# Patient Record
Sex: Male | Born: 1975
Health system: Southern US, Community
[De-identification: ages and names within clinical notes are randomized; demographics above are authoritative.]

## PROBLEM LIST (undated history)

## (undated) DIAGNOSIS — N289 Disorder of kidney and ureter, unspecified: Secondary | ICD-10-CM

## (undated) DIAGNOSIS — G43909 Migraine, unspecified, not intractable, without status migrainosus: Secondary | ICD-10-CM

## (undated) DIAGNOSIS — Z0000 Encounter for general adult medical examination without abnormal findings: Secondary | ICD-10-CM

## (undated) DIAGNOSIS — I10 Essential (primary) hypertension: Secondary | ICD-10-CM

## (undated) DIAGNOSIS — Z87442 Personal history of urinary calculi: Secondary | ICD-10-CM

## (undated) DIAGNOSIS — F1011 Alcohol abuse, in remission: Secondary | ICD-10-CM

## (undated) HISTORY — DX: Alcohol abuse, in remission: F10.11

## (undated) HISTORY — DX: Personal history of urinary calculi: Z87.442

## (undated) HISTORY — DX: Encounter for general adult medical examination without abnormal findings: Z00.00

## (undated) HISTORY — DX: Essential (primary) hypertension: I10

---

## 2014-04-04 ENCOUNTER — Emergency Department (HOSPITAL_BASED_OUTPATIENT_CLINIC_OR_DEPARTMENT_OTHER)
Admission: EM | Admit: 2014-04-04 | Discharge: 2014-04-04 | Disposition: A | Discharge status: Home / Self Care | Payer: BC Managed Care – PPO | Attending: Emergency Medicine | Admitting: Emergency Medicine

## 2014-04-04 ENCOUNTER — Encounter (HOSPITAL_BASED_OUTPATIENT_CLINIC_OR_DEPARTMENT_OTHER): Payer: Self-pay | Admitting: Emergency Medicine

## 2014-04-04 DIAGNOSIS — I10 Essential (primary) hypertension: Secondary | ICD-10-CM | POA: Insufficient documentation

## 2014-04-04 DIAGNOSIS — Z8679 Personal history of other diseases of the circulatory system: Secondary | ICD-10-CM | POA: Insufficient documentation

## 2014-04-04 DIAGNOSIS — J329 Chronic sinusitis, unspecified: Secondary | ICD-10-CM | POA: Insufficient documentation

## 2014-04-04 HISTORY — DX: Migraine, unspecified, not intractable, without status migrainosus: G43.909

## 2014-04-04 HISTORY — DX: Essential (primary) hypertension: I10

## 2014-04-04 MED ORDER — AMLODIPINE BESYLATE 10 MG PO TABS: 10.0000 mg | ORAL_TABLET | Freq: Every day | ORAL | Status: DC

## 2014-04-04 MED ORDER — HYDROCHLOROTHIAZIDE 25 MG PO TABS
25.0000 mg | ORAL_TABLET | Freq: Every day | ORAL | Status: DC
Start: 2014-04-04 — End: 2015-04-23

## 2014-04-04 MED ORDER — IBUPROFEN 400 MG PO TABS
600.0000 mg | ORAL_TABLET | Freq: Once | ORAL | Status: AC
  Administered 2014-04-04: 600 mg via ORAL
  Filled 2014-04-04 (×2): qty 1

## 2014-04-04 MED ORDER — BENZONATATE 100 MG PO CAPS
100.0000 mg | ORAL_CAPSULE | Freq: Once | ORAL | Status: AC
  Administered 2014-04-04: 100 mg via ORAL
  Filled 2014-04-04: qty 1

## 2014-04-04 NOTE — ED Provider Notes (Signed)
CSN: [id number]     Arrival date & time 04/04/14  1536 History   First MD Initiated Contact with Patient 04/04/14 1606     Chief Complaint  Patient presents with  . Cough     (Consider location/radiation/quality/duration/timing/severity/associated sxs/prior Treatment) Patient is a 38 y.o. male presenting with URI.  URI Presenting symptoms: congestion, cough, facial pain and sore throat   Severity:  Moderate Onset quality:  Gradual Duration:  7 days Timing:  Constant Progression:  Worsening Chronicity:  New Relieved by:  Nothing Ineffective treatments:  OTC medications and decongestant Associated symptoms: headaches, myalgias and sinus pain   Risk factors: no sick contacts     Past Medical History  Diagnosis Date  . Migraine   . Hypertension    History reviewed. No pertinent past surgical history. History reviewed. No pertinent family history. History  Substance Use Topics  . Smoking status: Never Smoker   . Smokeless tobacco: Not on file  . Alcohol Use: No    Review of Systems  HENT: Positive for congestion and sore throat.   Respiratory: Positive for cough.   Musculoskeletal: Positive for myalgias.  Neurological: Positive for headaches.  All other systems reviewed and are negative.     Allergies  Review of patient's allergies indicates no known allergies.  Home Medications   Prior to Admission medications   Not on File   BP 181/111  Pulse 70  Temp(Src) 98.2 F (36.8 C) (Oral)  Resp 18  Ht 6\' 2"  (1.88 m)  Wt 240 lb (108.863 kg)  BMI 30.80 kg/m2  SpO2 100% Physical Exam  Nursing note and vitals reviewed. Constitutional: He is oriented to person, place, and time. He appears well-developed and well-nourished. No distress.  HENT:  Head: Normocephalic and atraumatic.  Nose: Right sinus exhibits maxillary sinus tenderness and frontal sinus tenderness. Left sinus exhibits maxillary sinus tenderness and frontal sinus tenderness.  Mouth/Throat: Mucous  membranes are normal. No trismus in the jaw. Posterior oropharyngeal erythema present. No oropharyngeal exudate, posterior oropharyngeal edema or tonsillar abscesses.  Eyes: Conjunctivae are normal. Pupils are equal, round, and reactive to light. No scleral icterus.  Neck: Neck supple.  Cardiovascular: Normal rate, regular rhythm, normal heart sounds and intact distal pulses.   No murmur heard. Pulmonary/Chest: Effort normal and breath sounds normal. No stridor. No respiratory distress. He has no wheezes. He has no rales.  Abdominal: Soft. He exhibits no distension. There is no tenderness.  Musculoskeletal: Normal range of motion. He exhibits no edema.  Neurological: He is alert and oriented to person, place, and time.  Skin: Skin is warm and dry. No rash noted.  Psychiatric: He has a normal mood and affect. His behavior is normal.    ED Course  Procedures (including critical care time) Labs Review Labs Reviewed - No data to display  Imaging Review No results found.   EKG Interpretation None      MDM   Final diagnoses:  Sinusitis    38 year old male presenting with about a week worth of URI symptoms. Today, he coughed up what he described as "pus" which concerned him enough to bring him to the emergency department.  Well-appearing, nontoxic, lungs clear to auscultation bilaterally. Has sinus tenderness to palpation. Recommended supportive treatment for his likely viral sinusitis, excluding decongestants given his history of hypertension.  He also requests refills of his antihypertensives. He has a physical in 2 weeks with his primary doctor.    Candyce ChurnJohn David Waco Foerster III, MD 04/04/14 [phone]

## 2014-04-04 NOTE — ED Notes (Signed)
Pt reports productive cough with white sputum x 1 week.  Denies fever.

## 2014-04-04 NOTE — Discharge Instructions (Signed)

## 2014-09-16 DIAGNOSIS — I10 Essential (primary) hypertension: Secondary | ICD-10-CM

## 2014-09-16 HISTORY — DX: Essential (primary) hypertension: I10

## 2015-04-22 ENCOUNTER — Telehealth: Payer: Self-pay | Admitting: *Deleted

## 2015-04-22 NOTE — Telephone Encounter (Signed)
Unable to reach patient at time of Pre-Visit Call.  Left message for patient to return call when available.    

## 2015-04-23 ENCOUNTER — Telehealth: Payer: Self-pay | Admitting: *Deleted

## 2015-04-23 ENCOUNTER — Encounter: Payer: Self-pay | Admitting: Family

## 2015-04-23 ENCOUNTER — Ambulatory Visit (INDEPENDENT_AMBULATORY_CARE_PROVIDER_SITE_OTHER): Payer: BLUE CROSS/BLUE SHIELD | Admitting: Family

## 2015-04-23 VITALS — BP 180/120 | HR 77 | Temp 98.2°F | Resp 16 | Ht 73.5 in | Wt 223.8 lb

## 2015-04-23 DIAGNOSIS — G43909 Migraine, unspecified, not intractable, without status migrainosus: Secondary | ICD-10-CM | POA: Insufficient documentation

## 2015-04-23 DIAGNOSIS — I10 Essential (primary) hypertension: Secondary | ICD-10-CM | POA: Diagnosis not present

## 2015-04-23 DIAGNOSIS — Z0000 Encounter for general adult medical examination without abnormal findings: Secondary | ICD-10-CM | POA: Diagnosis not present

## 2015-04-23 DIAGNOSIS — G43809 Other migraine, not intractable, without status migrainosus: Secondary | ICD-10-CM

## 2015-04-23 HISTORY — DX: Encounter for general adult medical examination without abnormal findings: Z00.00

## 2015-04-23 HISTORY — DX: Essential (primary) hypertension: I10

## 2015-04-23 LAB — URINALYSIS, ROUTINE W REFLEX MICROSCOPIC
Bilirubin Urine: NEGATIVE
Hgb urine dipstick: NEGATIVE
Ketones, ur: NEGATIVE
Leukocytes, UA: NEGATIVE
Nitrite: NEGATIVE
RBC / HPF: NONE SEEN (ref 0–?)
Specific Gravity, Urine: 1.02 (ref 1.000–1.030)
Total Protein, Urine: NEGATIVE
URINE GLUCOSE: NEGATIVE
UROBILINOGEN UA: 0.2 (ref 0.0–1.0)
pH: 6 (ref 5.0–8.0)

## 2015-04-23 MED ORDER — AMLODIPINE BESYLATE 5 MG PO TABS: 5.0000 mg | ORAL_TABLET | Freq: Every day | ORAL | Status: DC

## 2015-04-23 NOTE — Patient Instructions (Signed)
Please complete lab work prior to leaving.  Start norvasc 5mg  once daily.  Follow up in 2 weeks.  Welcome to [facility]!

## 2015-04-23 NOTE — Assessment & Plan Note (Signed)
Stable.  Monitor.  

## 2015-04-23 NOTE — Assessment & Plan Note (Signed)
Uncontrolled.  Add amlodipine.  Reinforced importance of low sodium diet.

## 2015-04-23 NOTE — Progress Notes (Signed)
Subjective:    Patient ID: Taylor Herrera, male    DOB: [birth year], 39 y.o.   MRN: [id number]  HPI  Taylor Herrera is a 39 yr old male who presents today to establish care.  His primary concern today is his uncontrolled hypertension. He was diagnosed with hypertension 1 year ago. He is maintained on diovan- HCT.  He has used norvasc in the past.  Denies SOB/CP/Swelling. Positive compliance with meds and low sodium diet.  BP Readings from Last 3 Encounters:  04/23/15 180/120  04/04/14 181/111   Migraines- last 1-2 years ago.    Preventative- exercises 4-5 times a week. Diet- healthy Immunizations-Tetanus 6/15 Dental: up to date, needs an extraction.  Eye exam: march 2016- normal per pt  Review of Systems  Constitutional: Negative for unexpected weight change.  HENT: Negative for hearing loss and rhinorrhea.   Eyes: Negative for visual disturbance.  Cardiovascular: Negative for chest pain and leg swelling.  Gastrointestinal: Negative for diarrhea, constipation and anal bleeding.  Genitourinary: Negative for dysuria and frequency.  Musculoskeletal: Negative for myalgias and arthralgias.  Skin: Negative for rash.  Neurological:       Occasional headaches  Hematological:       Occasional cervical LAD  Psychiatric/Behavioral:       Denies depression/anxiety       Past Medical History  Diagnosis Date  . Migraine   . Hypertension   . History of kidney stones     History   Social History  . Marital Status: Married    Spouse Name: N/A  . Number of Children: N/A  . Years of Education: N/A   Occupational History  . Not on file.   Social History Main Topics  . Smoking status: Never Smoker   . Smokeless tobacco: Never Used  . Alcohol Use: 1.8 - 2.4 oz/week    3-4 Cans of beer per week  . Drug Use: No  . Sexual Activity: Not on file   Other Topics Concern  . 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 No pertinent past surgical history.  Family History  Problem Relation Age of Onset  . Cancer Mother     multiple myeloma, ovarian  . Hypertension Mother   . Hypertension Father   . Arrhythmia Father     No Known Allergies  No current outpatient prescriptions on file prior to visit.   No current facility-administered medications on file prior to visit.    BP 180/120 mmHg  Pulse 77  Temp(Src) 98.2 F (36.8 C) (Oral)  Resp 16  Ht 6' 1.5" (1.867 m)  Wt 223 lb 12.8 oz (101.515 kg)  BMI 29.12 kg/m2  SpO2 99%    Objective:   Physical Exam Physical Exam  Constitutional: He is oriented to person, place, and time. He appears well-developed and well-nourished. No distress.  HENT:  Head: Normocephalic and atraumatic.  Right Ear: Tympanic membrane and ear canal normal.  Left Ear: Tympanic membrane and ear canal normal.  Mouth/Throat: Oropharynx is clear and moist.  Eyes: Pupils are equal, round, and reactive to light. No scleral icterus.  Neck: Normal range of motion. No thyromegaly present.  Cardiovascular: Normal rate and regular rhythm.   No murmur heard. Pulmonary/Chest: Effort normal and breath sounds normal. No respiratory distress. He has no wheezes. He has no rales. He exhibits  no tenderness.  Abdominal: Soft. Bowel sounds are normal. He exhibits no distension and no mass. There is no tenderness. There is no rebound and no guarding.  Musculoskeletal: He exhibits no edema.  Lymphadenopathy:    He has no cervical adenopathy.  Neurological: He is alert and oriented to person, place, and time. He has normal reflexes. He exhibits normal muscle tone. Coordination normal.  Skin: Skin is warm and dry.  Psychiatric: He has a normal mood and affect. His behavior is normal. Judgment and thought content normal.          Assessment & Plan:          Assessment & Plan:

## 2015-04-23 NOTE — Telephone Encounter (Signed)
Amlodipine Rx sent to [facility] in error.  Cancelled Rx with pharmacist.

## 2015-04-23 NOTE — Assessment & Plan Note (Signed)
Immunizations reviewed and up to date.  Continue healthy diet, exercise.  Obtain routine labs.   

## 2015-04-23 NOTE — Progress Notes (Signed)
Pre visit review using our clinic review tool, if applicable. No additional management support is needed unless otherwise documented below in the visit note. 

## 2015-04-24 ENCOUNTER — Telehealth: Payer: Self-pay | Admitting: Family

## 2015-04-24 LAB — CBC WITH DIFFERENTIAL/PLATELET
Basophils Absolute: 0 10*3/uL (ref 0.0–0.1)
Basophils Relative: 0.5 % (ref 0.0–3.0)
EOS ABS: 0 10*3/uL (ref 0.0–0.7)
Eosinophils Relative: 0.6 % (ref 0.0–5.0)
HEMATOCRIT: 43.5 % (ref 39.0–52.0)
Hemoglobin: 14.7 g/dL (ref 13.0–17.0)
LYMPHS ABS: 1.4 10*3/uL (ref 0.7–4.0)
Lymphocytes Relative: 21.8 % (ref 12.0–46.0)
MCHC: 33.8 g/dL (ref 30.0–36.0)
MCV: 88.5 fl (ref 78.0–100.0)
MONO ABS: 0.3 10*3/uL (ref 0.1–1.0)
Monocytes Relative: 4.6 % (ref 3.0–12.0)
NEUTROS PCT: 72.5 % (ref 43.0–77.0)
Neutro Abs: 4.7 10*3/uL (ref 1.4–7.7)
Platelets: 311 10*3/uL (ref 150.0–400.0)
RBC: 4.91 Mil/uL (ref 4.22–5.81)
RDW: 14.1 % (ref 11.5–15.5)
WBC: 6.5 10*3/uL (ref 4.0–10.5)

## 2015-04-24 LAB — HEPATIC FUNCTION PANEL
ALBUMIN: 4.4 g/dL (ref 3.5–5.2)
ALT: 22 U/L (ref 0–53)
AST: 28 U/L (ref 0–37)
Alkaline Phosphatase: 98 U/L (ref 39–117)
Bilirubin, Direct: 0.2 mg/dL (ref 0.0–0.3)
Total Bilirubin: 0.8 mg/dL (ref 0.2–1.2)
Total Protein: 8.1 g/dL (ref 6.0–8.3)

## 2015-04-24 LAB — BASIC METABOLIC PANEL
BUN: 18 mg/dL (ref 6–23)
CHLORIDE: 100 meq/L (ref 96–112)
CO2: 32 meq/L (ref 19–32)
Calcium: 9.6 mg/dL (ref 8.4–10.5)
Creatinine, Ser: 1.33 mg/dL (ref 0.40–1.50)
GFR: 78.02 mL/min (ref >60.00)
GLUCOSE: 73 mg/dL (ref 70–99)
POTASSIUM: 3.7 meq/L (ref 3.5–5.1)
SODIUM: 139 meq/L (ref 135–145)

## 2015-04-24 LAB — HIV ANTIBODY (ROUTINE TESTING W REFLEX): HIV 1&2 Ab, 4th Generation: NONREACTIVE

## 2015-04-24 LAB — TSH: TSH: 0.38 u[IU]/mL (ref 0.35–4.50)

## 2015-04-24 MED ORDER — VALSARTAN-HYDROCHLOROTHIAZIDE 320-25 MG PO TABS: 1.0000 | ORAL_TABLET | Freq: Every day | ORAL | Status: DC

## 2015-04-24 NOTE — Telephone Encounter (Signed)
Caller name: Taylor Herrera Relation to pt: self Call back number: [phone] Pharmacy: [facility] on [address]  Reason for call:   Patient states that valsartan refill did not get to the pharmacy. Please resend.

## 2015-04-24 NOTE — Telephone Encounter (Signed)
Rx resent, patient notified.

## 2015-05-01 ENCOUNTER — Encounter: Payer: Self-pay | Admitting: Family

## 2015-05-12 ENCOUNTER — Encounter: Payer: Self-pay | Admitting: Family

## 2015-05-12 ENCOUNTER — Ambulatory Visit (INDEPENDENT_AMBULATORY_CARE_PROVIDER_SITE_OTHER): Payer: BLUE CROSS/BLUE SHIELD | Admitting: Family

## 2015-05-12 VITALS — BP 150/100 | HR 77 | Temp 98.3°F | Resp 16 | Ht 73.5 in | Wt 226.8 lb

## 2015-05-12 DIAGNOSIS — I10 Essential (primary) hypertension: Secondary | ICD-10-CM

## 2015-05-12 DIAGNOSIS — Z0000 Encounter for general adult medical examination without abnormal findings: Secondary | ICD-10-CM | POA: Diagnosis not present

## 2015-05-12 LAB — LIPID PANEL
CHOLESTEROL: 168 mg/dL (ref 0–200)
HDL: 78.1 mg/dL (ref >39.00)
LDL Cholesterol: 76 mg/dL (ref 0–99)
NonHDL: 89.9
Total CHOL/HDL Ratio: 2
Triglycerides: 71 mg/dL (ref 0.0–149.0)
VLDL: 14.2 mg/dL (ref 0.0–40.0)

## 2015-05-12 MED ORDER — AMLODIPINE BESYLATE 10 MG PO TABS: 10.0000 mg | ORAL_TABLET | Freq: Every day | ORAL | Status: DC

## 2015-05-12 NOTE — Assessment & Plan Note (Signed)
Improved but still elevated. Continue diovan hct, increase amlodipine from 5mg  to 10mg . Follow up in 1 month.

## 2015-05-12 NOTE — Patient Instructions (Signed)
Complete lab work prior to leaving.  Continue diovan hct. Increase amlodipine from 5mg  to 10mg  once daily.   Follow up in 1 month.

## 2015-05-12 NOTE — Progress Notes (Signed)
Pre visit review using our clinic review tool, if applicable. No additional management support is needed unless otherwise documented below in the visit note. 

## 2015-05-12 NOTE — Progress Notes (Signed)
   Subjective:    Patient ID: Taylor Herrera, male    DOB: [birth year], 39 y.o.   MRN: [id number]  HPI  Taylor Herrera is a 39 yr old male who presents today for follow up of his blood pressure.   HTN-Patient is currently maintained on the following medications for blood pressure: amlodipine, diovan-hct Patient reports good compliance with blood pressure medications. Patient denies chest pain, shortness of breath or swelling. Last 3 blood pressure readings in our office are as follows: BP Readings from Last 3 Encounters:  05/12/15 150/100  04/23/15 180/120  04/04/14 181/111     Review of Systems See HPI  Past Medical History  Diagnosis Date  . Migraine   . Hypertension   . History of kidney stones     History   Social History  . Marital Status: Married    Spouse Name: N/A  . Number of Children: N/A  . Years of Education: N/A   Occupational History  . Not on file.   Social History Main Topics  . Smoking status: Never Smoker   . Smokeless tobacco: Never Used  . Alcohol Use: 1.8 - 2.4 oz/week    3-4 Cans of beer per week  . Drug Use: No  . Sexual Activity: Not on file   Other Topics Concern  . 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  Family History  Problem Relation Age of Onset  . Cancer Mother     multiple myeloma, ovarian  . Hypertension Mother   . Hypertension Father   . Arrhythmia Father     No Known Allergies  Current Outpatient Prescriptions on File Prior to Visit  Medication Sig Dispense Refill  . valsartan-hydrochlorothiazide (DIOVAN-HCT) 320-25 MG per tablet Take 1 tablet by mouth daily. 30 tablet 5   No current facility-administered medications on file prior to visit.    BP 150/100 mmHg  Pulse 77  Temp(Src) 98.3 F (36.8 C) (Oral)  Resp 16  Ht 6' 1.5" (1.867 m)  Wt 226 lb 12.8 oz (102.876  kg)  BMI 29.51 kg/m2  SpO2 99%       Objective:   Physical Exam  Constitutional: He is oriented to person, place, and time. He appears well-developed and well-nourished. No distress.  HENT:  Head: Normocephalic and atraumatic.  Cardiovascular: Normal rate and regular rhythm.   No murmur heard. Pulmonary/Chest: Effort normal and breath sounds normal. No respiratory distress. He has no wheezes. He has no rales. He exhibits no tenderness.  Musculoskeletal: He exhibits no edema.  Neurological: He is alert and oriented to person, place, and time.  Psychiatric: He has a normal mood and affect. His behavior is normal. Judgment and thought content normal.          Assessment & Plan:

## 2015-06-10 ENCOUNTER — Encounter: Payer: Self-pay | Admitting: Family

## 2015-06-10 ENCOUNTER — Ambulatory Visit (INDEPENDENT_AMBULATORY_CARE_PROVIDER_SITE_OTHER): Payer: BLUE CROSS/BLUE SHIELD | Admitting: Family

## 2015-06-10 VITALS — BP 130/88 | HR 88 | Temp 98.1°F | Resp 16 | Ht 73.5 in | Wt 238.0 lb

## 2015-06-10 DIAGNOSIS — I10 Essential (primary) hypertension: Secondary | ICD-10-CM

## 2015-06-10 NOTE — Progress Notes (Signed)
Pre visit review using our clinic review tool, if applicable. No additional management support is needed unless otherwise documented below in the visit note. 

## 2015-06-10 NOTE — Assessment & Plan Note (Signed)
Improved. Continue current meds.  Repeat BP 130/88.

## 2015-06-10 NOTE — Patient Instructions (Signed)
Continue current meds 

## 2015-06-10 NOTE — Progress Notes (Signed)
   Subjective:    Patient ID: Taylor Herrera, male    DOB: 09-May-1976, 39 y.o.   MRN: [id number]  HPI   Patient is currently maintained on the following medications for blood pressure: amlodipine and diovan-hct. Patient reports good compliance with blood pressure medications. Patient denies chest pain, shortness of breath or swelling. Last 3 blood pressure readings in our office are as follows:   BP Readings from Last 3 Encounters:  06/10/15 136/90  05/12/15 150/100  04/23/15 180/120        Review of Systems See HPI  Past Medical History  Diagnosis Date  . Migraine   . Hypertension   . History of kidney stones     History   Social History  . Marital Status: Married    Spouse Name: N/A  . Number of Children: N/A  . Years of Education: N/A   Occupational History  . Not on file.   Social History Main Topics  . Smoking status: Never Smoker   . Smokeless tobacco: Never Used  . Alcohol Use: 1.8 - 2.4 oz/week    3-4 Cans of beer per week  . Drug Use: No  . Sexual Activity: Not on file   Other Topics Concern  . 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  Family History  Problem Relation Age of Onset  . Cancer Mother     multiple myeloma, ovarian  . Hypertension Mother   . Hypertension Father   . Arrhythmia Father     No Known Allergies  Current Outpatient Prescriptions on File Prior to Visit  Medication Sig Dispense Refill  . amLODipine (NORVASC) 10 MG tablet Take 1 tablet (10 mg total) by mouth daily. 30 tablet 3  . valsartan-hydrochlorothiazide (DIOVAN-HCT) 320-25 MG per tablet Take 1 tablet by mouth daily. 30 tablet 5   No current facility-administered medications on file prior to visit.    BP 130/88 mmHg  Pulse 88  Temp(Src) 98.1 F (36.7 C) (Oral)  Resp 16  Ht 6' 1.5" (1.867 m)  Wt 238 lb (107.956  kg)  BMI 30.97 kg/m2  SpO2 99%       Objective:   Physical Exam  Constitutional: He is oriented to person, place, and time. He appears well-developed and well-nourished. No distress.  HENT:  Head: Normocephalic and atraumatic.  Cardiovascular: Normal rate and regular rhythm.   No murmur heard. Pulmonary/Chest: Effort normal and breath sounds normal. No respiratory distress. He has no wheezes. He has no rales.  Musculoskeletal: He exhibits no edema.  Neurological: He is alert and oriented to person, place, and time.  Skin: Skin is warm and dry.  Psychiatric: He has a normal mood and affect. His behavior is normal. Thought content normal.          Assessment & Plan:

## 2015-09-15 ENCOUNTER — Ambulatory Visit: Payer: BLUE CROSS/BLUE SHIELD | Admitting: Family

## 2015-09-16 ENCOUNTER — Telehealth: Payer: Self-pay | Admitting: *Deleted

## 2015-09-16 MED ORDER — AMLODIPINE BESYLATE 10 MG PO TABS: 10.0000 mg | ORAL_TABLET | Freq: Every day | ORAL | Status: DC

## 2015-09-16 MED ORDER — VALSARTAN-HYDROCHLOROTHIAZIDE 320-25 MG PO TABS: 1.0000 | ORAL_TABLET | Freq: Every day | ORAL | Status: DC

## 2015-09-16 NOTE — Telephone Encounter (Signed)
Received fax from [facility] requesting new Rxs for valsartan/hctz 320/25mg   And amlodipine 5mg .  Pt is now taking amlodipine 10mg  daily. Pt has f/u 09/21/15.  Rxs sent.

## 2015-09-21 ENCOUNTER — Telehealth: Payer: Self-pay | Admitting: *Deleted

## 2015-09-21 ENCOUNTER — Ambulatory Visit (INDEPENDENT_AMBULATORY_CARE_PROVIDER_SITE_OTHER): Payer: BLUE CROSS/BLUE SHIELD | Admitting: Family

## 2015-09-21 ENCOUNTER — Encounter: Payer: Self-pay | Admitting: Family

## 2015-09-21 VITALS — BP 168/122 | HR 72 | Temp 98.7°F | Resp 18 | Ht 73.5 in | Wt 239.0 lb

## 2015-09-21 DIAGNOSIS — I10 Essential (primary) hypertension: Secondary | ICD-10-CM | POA: Diagnosis not present

## 2015-09-21 DIAGNOSIS — R0600 Dyspnea, unspecified: Secondary | ICD-10-CM | POA: Diagnosis not present

## 2015-09-21 LAB — CBC WITH DIFFERENTIAL/PLATELET
BASOS ABS: 0 10*3/uL (ref 0.0–0.1)
Basophils Relative: 0.4 % (ref 0.0–3.0)
Eosinophils Absolute: 0.1 10*3/uL (ref 0.0–0.7)
Eosinophils Relative: 1.4 % (ref 0.0–5.0)
HCT: 46.2 % (ref 39.0–52.0)
Hemoglobin: 15.7 g/dL (ref 13.0–17.0)
LYMPHS ABS: 1.5 10*3/uL (ref 0.7–4.0)
Lymphocytes Relative: 22.4 % (ref 12.0–46.0)
MCHC: 33.9 g/dL (ref 30.0–36.0)
MCV: 87.3 fl (ref 78.0–100.0)
MONO ABS: 0.5 10*3/uL (ref 0.1–1.0)
Monocytes Relative: 6.5 % (ref 3.0–12.0)
NEUTROS ABS: 4.8 10*3/uL (ref 1.4–7.7)
NEUTROS PCT: 69.3 % (ref 43.0–77.0)
PLATELETS: 300 10*3/uL (ref 150.0–400.0)
RBC: 5.29 Mil/uL (ref 4.22–5.81)
RDW: 13.1 % (ref 11.5–15.5)
WBC: 6.9 10*3/uL (ref 4.0–10.5)

## 2015-09-21 LAB — BASIC METABOLIC PANEL
BUN: 21 mg/dL (ref 6–23)
CO2: 25 meq/L (ref 19–32)
Calcium: 10.1 mg/dL (ref 8.4–10.5)
Chloride: 101 mEq/L (ref 96–112)
Creatinine, Ser: 1.03 mg/dL (ref 0.40–1.50)
GFR: 104.55 mL/min (ref >60.00)
GLUCOSE: 101 mg/dL — AB (ref 70–99)
Potassium: 3.6 mEq/L (ref 3.5–5.1)
Sodium: 139 mEq/L (ref 135–145)

## 2015-09-21 LAB — D-DIMER, QUANTITATIVE: D-Dimer, Quant: 0.27 ug/mL-FEU (ref 0.00–0.48)

## 2015-09-21 MED ORDER — METOPROLOL SUCCINATE ER 50 MG PO TB24: 50.0000 mg | ORAL_TABLET | Freq: Every day | ORAL | Status: DC

## 2015-09-21 NOTE — Telephone Encounter (Signed)
Received call from Selena BattenKim at [facility] re: stat D-dimer from today. Result is within normal range at 0.27.

## 2015-09-21 NOTE — Assessment & Plan Note (Addendum)
Uncontrolled. Add beta blocker.  Follow up in 1 month.

## 2015-09-21 NOTE — Progress Notes (Signed)
Pre visit review using our clinic review tool, if applicable. No additional management support is needed unless otherwise documented below in the visit note. 

## 2015-09-21 NOTE — Patient Instructions (Signed)
Please complete lab work prior to leaving.  Start toprol xl once daily. You will be contacted about your referral for stress test and echocardiogram. Go to the ER if your develop chest pain or worsening shortness of breath.

## 2015-09-21 NOTE — Progress Notes (Signed)
   Subjective:    Patient ID: Taylor Herrera, male    DOB: 30-May-1976, 39 y.o.   MRN: [id number]  HPI  Mr.  Cozzolino is a 39 yr old male who presents today for follow up of his blood pressure.  Reports + med compliance.  BP Readings from Last 3 Encounters:  09/21/15 168/122  06/10/15 130/88  05/12/15 150/100   Reports that the has been experiencing sob with walking up stairs or getting up too fast.  Denies associated chest pain, calf pain, or leg swelling. Declines flu shot today.   Review of Systems See HPI      Past Medical History  Diagnosis Date  . Migraine   . Hypertension   . History of kidney stones     Social History   Social History  . Marital Status: Married    Spouse Name: N/A  . Number of Children: N/A  . Years of Education: N/A   Occupational History  . Not on file.   Social History Main Topics  . Smoking status: Never Smoker   . Smokeless tobacco: Never Used  . Alcohol Use: 1.8 - 2.4 oz/week    3-4 Cans of beer per week  . Drug Use: No  . Sexual Activity: Not on file   Other Topics Concern  . 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  Family History  Problem Relation Age of Onset  . Cancer Mother     multiple myeloma, ovarian  . Hypertension Mother   . Hypertension Father   . Arrhythmia Father     No Known Allergies  Current Outpatient Prescriptions on File Prior to Visit  Medication Sig Dispense Refill  . amLODipine (NORVASC) 10 MG tablet Take 1 tablet (10 mg total) by mouth daily. 90 tablet 1  . valsartan-hydrochlorothiazide (DIOVAN-HCT) 320-25 MG tablet Take 1 tablet by mouth daily. 90 tablet 1   No current facility-administered medications on file prior to visit.    BP 168/122 mmHg  Pulse 72  Temp(Src) 98.7 F (37.1 C) (Oral)  Resp 18  Ht 6' 1.5" (1.867 m)  Wt 239 lb (108.41 kg)   BMI 31.10 kg/m2  SpO2 100%    Objective:   Physical Exam  Constitutional: He is oriented to person, place, and time. He appears well-developed and well-nourished. No distress.  HENT:  Head: Normocephalic and atraumatic.  Cardiovascular: Normal rate and regular rhythm.   No murmur heard. Pulmonary/Chest: Effort normal and breath sounds normal. No respiratory distress. He has no wheezes. He has no rales.  Musculoskeletal: He exhibits no edema.  Lymphadenopathy:    He has no cervical adenopathy.  Neurological: He is alert and oriented to person, place, and time.  Skin: Skin is warm and dry.  Psychiatric: He has a normal mood and affect. His behavior is normal. Thought content normal.          Assessment & Plan:  EKG tracing is personally reviewed.  EKG notes NSR.  No acute changes.   Dyspnea-  Will obtain d dimer to rule out PE (clinically doubt PE).  Refer for stress test and 2D- echo to further evaluate. If + D dimer will need CTA chest.

## 2015-09-23 ENCOUNTER — Encounter: Payer: Self-pay | Admitting: Family

## 2015-09-23 NOTE — Telephone Encounter (Signed)
[id number]  This is the validation number for his echo from his insurance. Thanks.

## 2015-10-05 ENCOUNTER — Emergency Department (HOSPITAL_BASED_OUTPATIENT_CLINIC_OR_DEPARTMENT_OTHER)
Admission: EM | Admit: 2015-10-05 | Discharge: 2015-10-06 | Disposition: A | Discharge status: Home / Self Care | Payer: BLUE CROSS/BLUE SHIELD | Attending: Emergency Medicine | Admitting: Emergency Medicine

## 2015-10-05 ENCOUNTER — Emergency Department (HOSPITAL_BASED_OUTPATIENT_CLINIC_OR_DEPARTMENT_OTHER): Payer: BLUE CROSS/BLUE SHIELD

## 2015-10-05 ENCOUNTER — Encounter (HOSPITAL_BASED_OUTPATIENT_CLINIC_OR_DEPARTMENT_OTHER): Payer: Self-pay | Admitting: Emergency Medicine

## 2015-10-05 ENCOUNTER — Encounter: Payer: Self-pay | Admitting: Family

## 2015-10-05 DIAGNOSIS — R079 Chest pain, unspecified: Secondary | ICD-10-CM | POA: Diagnosis present

## 2015-10-05 DIAGNOSIS — I10 Essential (primary) hypertension: Secondary | ICD-10-CM | POA: Diagnosis not present

## 2015-10-05 DIAGNOSIS — Z79899 Other long term (current) drug therapy: Secondary | ICD-10-CM | POA: Insufficient documentation

## 2015-10-05 DIAGNOSIS — G43909 Migraine, unspecified, not intractable, without status migrainosus: Secondary | ICD-10-CM | POA: Insufficient documentation

## 2015-10-05 DIAGNOSIS — R0602 Shortness of breath: Secondary | ICD-10-CM | POA: Diagnosis not present

## 2015-10-05 DIAGNOSIS — Z87442 Personal history of urinary calculi: Secondary | ICD-10-CM | POA: Diagnosis not present

## 2015-10-05 DIAGNOSIS — R109 Unspecified abdominal pain: Secondary | ICD-10-CM | POA: Insufficient documentation

## 2015-10-05 DIAGNOSIS — R112 Nausea with vomiting, unspecified: Secondary | ICD-10-CM | POA: Diagnosis not present

## 2015-10-05 LAB — COMPREHENSIVE METABOLIC PANEL
ALT: 32 U/L (ref 17–63)
AST: 33 U/L (ref 15–41)
Albumin: 4.5 g/dL (ref 3.5–5.0)
Alkaline Phosphatase: 92 U/L (ref 38–126)
Anion gap: 9 (ref 5–15)
BUN: 18 mg/dL (ref 6–20)
CHLORIDE: 104 mmol/L (ref 101–111)
CO2: 28 mmol/L (ref 22–32)
Calcium: 9.5 mg/dL (ref 8.9–10.3)
Creatinine, Ser: 1.04 mg/dL (ref 0.61–1.24)
GFR calc Af Amer: >60 mL/min (ref >60)
Glucose, Bld: 129 mg/dL — ABNORMAL HIGH (ref 65–99)
Potassium: 4.1 mmol/L (ref 3.5–5.1)
SODIUM: 141 mmol/L (ref 135–145)
Total Bilirubin: 0.9 mg/dL (ref 0.3–1.2)
Total Protein: 8.3 g/dL — ABNORMAL HIGH (ref 6.5–8.1)

## 2015-10-05 LAB — CBC
HCT: 44.1 % (ref 39.0–52.0)
Hemoglobin: 15.3 g/dL (ref 13.0–17.0)
MCH: 29.3 pg (ref 26.0–34.0)
MCHC: 34.7 g/dL (ref 30.0–36.0)
MCV: 84.3 fL (ref 78.0–100.0)
PLATELETS: 275 10*3/uL (ref 150–400)
RBC: 5.23 MIL/uL (ref 4.22–5.81)
RDW: 12.7 % (ref 11.5–15.5)
WBC: 9.6 10*3/uL (ref 4.0–10.5)

## 2015-10-05 LAB — TROPONIN I
Troponin I: <0.03 ng/mL (ref <0.031)
Troponin I: <0.03 ng/mL (ref <0.031)

## 2015-10-05 MED ORDER — NITROGLYCERIN 0.4 MG SL SUBL
SUBLINGUAL_TABLET | SUBLINGUAL | Status: AC
  Administered 2015-10-05: 0.4 mg via SUBLINGUAL
  Filled 2015-10-05: qty 1

## 2015-10-05 MED ORDER — ASPIRIN 81 MG PO CHEW
CHEWABLE_TABLET | ORAL | Status: AC
  Filled 2015-10-05: qty 4

## 2015-10-05 MED ORDER — NITROGLYCERIN 0.4 MG SL SUBL
0.4000 mg | SUBLINGUAL_TABLET | SUBLINGUAL | Status: DC | PRN
  Administered 2015-10-05 (×3): 0.4 mg via SUBLINGUAL

## 2015-10-05 MED ORDER — ONDANSETRON HCL 4 MG/2ML IJ SOLN
INTRAMUSCULAR | Status: AC
  Administered 2015-10-05: 4 mg via INTRAVENOUS
  Filled 2015-10-05: qty 2

## 2015-10-05 MED ORDER — ASPIRIN 81 MG PO CHEW
324.0000 mg | CHEWABLE_TABLET | Freq: Once | ORAL | Status: AC
  Administered 2015-10-05: 324 mg via ORAL

## 2015-10-05 MED ORDER — ONDANSETRON HCL 4 MG/2ML IJ SOLN
4.0000 mg | Freq: Once | INTRAMUSCULAR | Status: AC
  Administered 2015-10-05: 4 mg via INTRAVENOUS

## 2015-10-05 NOTE — ED Notes (Signed)
Patient transported to X-ray 

## 2015-10-05 NOTE — ED Notes (Signed)
Pt reports headache that started around 12 noon, crushing pain to left sided chest denies radiation to arm or neck, +nausea +diaphoresis, currently being worked up for chest pain by pcp

## 2015-10-05 NOTE — Discharge Instructions (Signed)
The exact cause of your chest pain is uncertain at this time.  You need to follow up with your primary care physician or a cardiologist as soon as possible to arrange further outpatient work up.  Return with new, sudden, worsening symptoms.  Nonspecific Chest Pain  Chest pain can be caused by many different conditions. There is always a chance that your pain could be related to something serious, such as a heart attack or a blood clot in your lungs. Chest pain can also be caused by conditions that are not life-threatening. If you have chest pain, it is very important to follow up with your health care provider. CAUSES  Chest pain can be caused by:  Heartburn.  Pneumonia or bronchitis.  Anxiety or stress.  Inflammation around your heart (pericarditis) or lung (pleuritis or pleurisy).  A blood clot in your lung.  A collapsed lung (pneumothorax). It can develop suddenly on its own (spontaneous pneumothorax) or from trauma to the chest.  Shingles infection (varicella-zoster virus).  Heart attack.  Damage to the bones, muscles, and cartilage that make up your chest wall. This can include:  Bruised bones due to injury.  Strained muscles or cartilage due to frequent or repeated coughing or overwork.  Fracture to one or more ribs.  Sore cartilage due to inflammation (costochondritis). RISK FACTORS  Risk factors for chest pain may include:  Activities that increase your risk for trauma or injury to your chest.  Respiratory infections or conditions that cause frequent coughing.  Medical conditions or overeating that can cause heartburn.  Heart disease or family history of heart disease.  Conditions or health behaviors that increase your risk of developing a blood clot.  Having had chicken pox (varicella zoster). SIGNS AND SYMPTOMS Chest pain can feel like:  Burning or tingling on the surface of your chest or deep in your chest.  Crushing, pressure, aching, or squeezing  pain.  Dull or sharp pain that is worse when you move, cough, or take a deep breath.  Pain that is also felt in your back, neck, shoulder, or arm, or pain that spreads to any of these areas. Your chest pain may come and go, or it may stay constant. DIAGNOSIS Lab tests or other studies may be needed to find the cause of your pain. Your health care provider may have you take a test called an ambulatory ECG (electrocardiogram). An ECG records your heartbeat patterns at the time the test is performed. You may also have other tests, such as:  Transthoracic echocardiogram (TTE). During echocardiography, sound waves are used to create a picture of all of the heart structures and to look at how blood flows through your heart.  Transesophageal echocardiogram (TEE).This is a more advanced imaging test that obtains images from inside your body. It allows your health care provider to see your heart in finer detail.  Cardiac monitoring. This allows your health care provider to monitor your heart rate and rhythm in real time.  Holter monitor. This is a portable device that records your heartbeat and can help to diagnose abnormal heartbeats. It allows your health care provider to track your heart activity for several days, if needed.  Stress tests. These can be done through exercise or by taking medicine that makes your heart beat more quickly.  Blood tests.  Imaging tests. TREATMENT  Your treatment depends on what is causing your chest pain. Treatment may include:  Medicines. These may include:  Acid blockers for heartburn.  Anti-inflammatory medicine.  Pain medicine for inflammatory conditions.  Antibiotic medicine, if an infection is present.  Medicines to dissolve blood clots.  Medicines to treat coronary artery disease.  Supportive care for conditions that do not require medicines. This may include:  Resting.  Applying heat or cold packs to injured areas.  Limiting activities  until pain decreases. HOME CARE INSTRUCTIONS  If you were prescribed an antibiotic medicine, finish it all even if you start to feel better.  Avoid any activities that bring on chest pain.  Do not use any tobacco products, including cigarettes, chewing tobacco, or electronic cigarettes. If you need help quitting, ask your health care provider.  Do not drink alcohol.  Take medicines only as directed by your health care provider.  Keep all follow-up visits as directed by your health care provider. This is important. This includes any further testing if your chest pain does not go away.  If heartburn is the cause for your chest pain, you may be told to keep your head raised (elevated) while sleeping. This reduces the chance that acid will go from your stomach into your esophagus.  Make lifestyle changes as directed by your health care provider. These may include:  Getting regular exercise. Ask your health care provider to suggest some activities that are safe for you.  Eating a heart-healthy diet. A registered dietitian can help you to learn healthy eating options.  Maintaining a healthy weight.  Managing diabetes, if necessary.  Reducing stress. SEEK MEDICAL CARE IF:  Your chest pain does not go away after treatment.  You have a rash with blisters on your chest.  You have a fever. SEEK IMMEDIATE MEDICAL CARE IF:   Your chest pain is worse.  You have an increasing cough, or you cough up blood.  You have severe abdominal pain.  You have severe weakness.  You faint.  You have chills.  You have sudden, unexplained chest discomfort.  You have sudden, unexplained discomfort in your arms, back, neck, or jaw.  You have shortness of breath at any time.  You suddenly start to sweat, or your skin gets clammy.  You feel nauseous or you vomit.  You suddenly feel light-headed or dizzy.  Your heart begins to beat quickly, or it feels like it is skipping beats. These  symptoms may represent a serious problem that is an emergency. Do not wait to see if the symptoms will go away. Get medical help right away. Call your local emergency services (911 in the [place] Do not drive yourself to the hospital.   This information is not intended to replace advice given to you by your health care provider. Make sure you discuss any questions you have with your health care provider.   Document Released: 08/17/2005 Document Revised: 11/28/2014 Document Reviewed: 06/13/2014 Elsevier Interactive Patient Education [employer].

## 2015-10-05 NOTE — ED Provider Notes (Signed)
CSN: [id number]     Arrival date & time 10/05/15  2011 History  By signing my name below, I, Helane Gunther, attest that this documentation has been prepared under the direction and in the presence of Harvel Quale, MD. Electronically Signed: Helane Gunther, ED Scribe. 10/05/2015. 8:49 PM.      Chief Complaint  Patient presents with  . Chest Pain   The history is provided by the patient. No language interpreter was used.   HPI Comments: Taylor Herrera is a 39 y.o. male with a PMHx of HTN who presents to the Emergency Department complaining of worsening, non-radiating, central CP onset nearly 9 hours ago. He reports associated SOB, HA, n/v, and abdominal discomfort. He states that this chest pain feels different from his usual discomfort. He notes exacerbation of the pain when he attempts to take a deep breath, but states he is unable to do so, due to the pain. He notes he will next see his primary care physician on 11/28 to schedule a stress test. He reports a FHx of cardiac issues.  Per ED nurse, pt has been given 1 NTG and 4 baby aspirin. She also notes pt's blood pressure medication has been changed recently. He states he feels much better after receiving treatment.   Past Medical History  Diagnosis Date  . Migraine   . Hypertension   . History of kidney stones    History reviewed. No pertinent past surgical history. Family History  Problem Relation Age of Onset  . Cancer Mother     multiple myeloma, ovarian  . Hypertension Mother   . Hypertension Father   . Arrhythmia Father    Social History  Substance Use Topics  . Smoking status: Never Smoker   . Smokeless tobacco: Never Used  . Alcohol Use: No     Comment: occasional    Review of Systems  Constitutional: Negative for fever, chills and fatigue.  HENT: Negative for congestion, sinus pressure and sore throat.   Respiratory: Positive for shortness of breath. Negative for cough and chest tightness.   Cardiovascular:  Positive for chest pain.  Gastrointestinal: Positive for nausea, vomiting and abdominal pain.  Genitourinary: Negative for dysuria, frequency and decreased urine volume.  Musculoskeletal: Negative for myalgias and back pain.  Skin: Negative for rash.  Neurological: Positive for headaches. Negative for weakness and numbness.  Hematological: Does not bruise/bleed easily.    Allergies  Review of patient's allergies indicates no known allergies.  Home Medications   Prior to Admission medications   Medication Sig Start Date End Date Taking? Authorizing Provider  amLODipine (NORVASC) 10 MG tablet Take 1 tablet (10 mg total) by mouth daily. 09/16/15  Yes [name], NP  metoprolol succinate (TOPROL-XL) 50 MG 24 hr tablet Take 1 tablet (50 mg total) by mouth daily. Take with or immediately following a meal. 09/21/15   [name], NP  valsartan-hydrochlorothiazide (DIOVAN-HCT) 320-25 MG tablet Take 1 tablet by mouth daily. 09/16/15   [name], NP   BP 145/98 mmHg  Pulse 67  Temp(Src) 98.3 F (36.8 C) (Oral)  Resp 18  Ht _0  (1.88 m)  Wt 240 lb (108.863 kg)  BMI 30.80 kg/m2  SpO2 98% Physical Exam  Constitutional: He is oriented to person, place, and time. He appears well-developed and well-nourished. No distress.  HENT:  Head: Normocephalic and atraumatic.  Right Ear: External ear normal.  Left Ear: External ear normal.  Mouth/Throat: Oropharynx is clear and moist. No oropharyngeal exudate.  Eyes:  EOM are normal. Pupils are equal, round, and reactive to light.  Neck: Normal range of motion. Neck supple.  Cardiovascular: Normal rate, regular rhythm, normal heart sounds and intact distal pulses.   No murmur heard. Pulmonary/Chest: Effort normal. No respiratory distress. He has no wheezes. He has no rales.  Abdominal: Soft. He exhibits no distension. There is no tenderness.  Musculoskeletal: He exhibits no edema.  Neurological: He is alert and oriented to  person, place, and time.  Skin: Skin is warm and dry. No rash noted. He is not diaphoretic.  Vitals reviewed.   ED Course  Procedures  DIAGNOSTIC STUDIES: Oxygen Saturation is 99% on RA, normal by my interpretation.    COORDINATION OF CARE: 8:44 PM - Discussed plans to wait on diagnostic studies. Pt advised of plan for treatment and pt agrees.  Labs Review Labs Reviewed  COMPREHENSIVE METABOLIC PANEL - Abnormal; Notable for the following:    Glucose, Bld 129 (*)    Total Protein 8.3 (*)    All other components within normal limits  TROPONIN I  CBC  TROPONIN I    Imaging Review Dg Chest 2 View  10/05/2015  CLINICAL DATA:  Pt states that since this afternoon he has had central chest pain that radiates to the left side and up his neck and down arm with nausea and diaphoreses. Hx of htn. EXAM: CHEST - 2 VIEW COMPARISON:  None available FINDINGS: Relatively low lung volumes. Lungs are clear. Heart size and mediastinal contours are within normal limits. No effusion.  No pneumothorax. Visualized skeletal structures are unremarkable. Mild gaseous distention of the stomach. IMPRESSION: No acute cardiopulmonary disease. Electronically Signed   By: Lucrezia Europe M.D.   On: 10/05/2015 21:14   I have personally reviewed and evaluated these images and lab results as part of my medical decision-making.   EKG Interpretation   Date/Time:  Monday October 05 2015 20:40:25 EST Ventricular Rate:  86 PR Interval:  142 QRS Duration: 86 QT Interval:  382 QTC Calculation: 457 R Axis:   46 Text Interpretation:  Normal sinus rhythm Normal ECG No previous ECGs  available Reconfirmed by NGUYEN, EMILY ([id number]) on 10/06/2015 12:43:13 AM      MDM  Patient was seen and evaluated in stable condition.  Pain improved/resolved after Nitro SL and did not return in the ER.  EKG unremarkable.  Chest xray unremarkable.  Troponin x2 normal.  Discussed results with patient and his father at the bedside.  His father  reported he only developed heart issues late in life and that no one in the family had heart issues at a young age.  Calculated Heartscore 3 with 1 point for HTN and 2 points for suspicion on presentation.  Discussed these results with patient and father as well.  Patient said that he would be able to follow up closely tomorrow and that he felt like he would rather go home at this time.  His father said he would be with him tonight and they would return immediately with new symptoms or return of symptoms.  Patient was discharged with strict return precautions and instruction to follow up with his PCP and cardiology. Final diagnoses:  Chest pain, unspecified chest pain type    1. Chest pain  I personally performed the services described in this documentation, which was scribed in my presence. The recorded information has been reviewed and is accurate.   Harvel Quale, MD 10/06/15 [phone]

## 2015-10-06 ENCOUNTER — Telehealth: Payer: Self-pay | Admitting: Family

## 2015-10-06 NOTE — Telephone Encounter (Signed)
Candise BowensJen-- can you keep checking on below tests for this pt? Thanks!

## 2015-10-06 NOTE — ED Notes (Signed)
MD at bedside. 

## 2015-10-06 NOTE — Telephone Encounter (Signed)
Could you please check status of stress test and echo ordered 10/31? thanks

## 2015-10-06 NOTE — Telephone Encounter (Signed)
Pt called to f/u on ER visit and asked about stress test and echocardiogram. Per Candise BowensJen they have not been approved by insurance yet.

## 2015-10-06 NOTE — Telephone Encounter (Signed)
[id number]- good through 11/29.

## 2015-10-06 NOTE — Telephone Encounter (Signed)
Echo cardiogram is awaiting peer to peer review, see below.  Oneal GroutJennifer S Sebastian  Sandford CrazeMelissa O'Sullivan, NP           No, no case number. They said all you need is name and DOB. Here's his insurance ID # [id number], you can see if that will help you better. Thanks       Previous Messages     ----- Message -----   From: Sandford CrazeMelissa O'Sullivan, NP   Sent: 09/23/2015  8:57 AM    To: Oneal GroutJennifer S Sebastian   Is there a case number please?  ----- Message -----   From: Oneal GroutJennifer S Sebastian   Sent: 09/21/2015  2:19 PM    To: Sandford CrazeMelissa O'Sullivan, NP   Will need peer to peer review for Echo, please call [phone]  Thanks

## 2015-10-07 ENCOUNTER — Telehealth (HOSPITAL_COMMUNITY): Payer: Self-pay | Admitting: *Deleted

## 2015-10-17 NOTE — Telephone Encounter (Signed)
Taylor Herrera, I see echo is scheduled for 12/6.  Approval only good through 11/29, can we please move up?

## 2015-10-19 ENCOUNTER — Encounter: Payer: Self-pay | Admitting: Family

## 2015-10-19 ENCOUNTER — Ambulatory Visit (INDEPENDENT_AMBULATORY_CARE_PROVIDER_SITE_OTHER): Payer: BLUE CROSS/BLUE SHIELD | Admitting: Family

## 2015-10-19 VITALS — BP 140/104 | HR 72 | Temp 98.7°F | Resp 16 | Ht 73.5 in | Wt 238.8 lb

## 2015-10-19 DIAGNOSIS — I10 Essential (primary) hypertension: Secondary | ICD-10-CM

## 2015-10-19 MED ORDER — METOPROLOL SUCCINATE ER 100 MG PO TB24: 100.0000 mg | ORAL_TABLET | Freq: Every day | ORAL | Status: DC

## 2015-10-19 NOTE — Patient Instructions (Signed)
Increase toprol xl from 50mg  to 100mg  once daily.

## 2015-10-19 NOTE — Progress Notes (Signed)
Pre visit review using our clinic review tool, if applicable. No additional management support is needed unless otherwise documented below in the visit note. 

## 2015-10-19 NOTE — Telephone Encounter (Signed)
New insurance Berkley Harveyauth was given, Auth # [id number] good till 11/17/15

## 2015-10-19 NOTE — Progress Notes (Signed)
Subjective:    Patient ID: Taylor Herrera, male    DOB: August 19, 1976, 39 y.o.   MRN: [id number]  HPI  Taylor Herrera is a 39 yr old male with history of uncontrolled hypertension. Last visit he was noted to elevated blood pressure.  He also reported some SOB with walking.  D. Dimer was negative and he was referred for stress test and a 2D echo. Toprol xl was added to his regimen.  BP Readings from Last 3 Encounters:  10/19/15 140/104  10/06/15 145/98  09/21/15 168/122   Since his last visit he was evaluated in the ED on 10/05/15 with c/o chest pain.   Cardiac enzymes were negative.  He is scheduled for stress test and echo on 12/6.     Review of Systems  Respiratory: Negative for shortness of breath.   Cardiovascular: Negative for chest pain and leg swelling.      see HPI Past Medical History  Diagnosis Date  . Migraine   . Hypertension   . History of kidney stones     Social History   Social History  . Marital Status: Married    Spouse Name: N/A  . Number of Children: N/A  . Years of Education: N/A   Occupational History  . Not on file.   Social History Main Topics  . Smoking status: Never Smoker   . Smokeless tobacco: Never Used  . Alcohol Use: No     Comment: occasional  . Drug Use: No  . Sexual Activity: Not on file   Other Topics Concern  . 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  Family History  Problem Relation Age of Onset  . Cancer Mother     multiple myeloma, ovarian  . Hypertension Mother   . Hypertension Father   . Arrhythmia Father     No Known Allergies  Current Outpatient Prescriptions on File Prior to Visit  Medication Sig Dispense Refill  . amLODipine (NORVASC) 10 MG tablet Take 1 tablet (10 mg total) by mouth daily. 90 tablet 1  . metoprolol succinate (TOPROL-XL) 50 MG 24 hr tablet Take 1  tablet (50 mg total) by mouth daily. Take with or immediately following a meal. 30 tablet 1  . valsartan-hydrochlorothiazide (DIOVAN-HCT) 320-25 MG tablet Take 1 tablet by mouth daily. 90 tablet 1   No current facility-administered medications on file prior to visit.    BP 140/104 mmHg  Pulse 72  Temp(Src) 98.7 F (37.1 C) (Oral)  Resp 16  Ht 6' 1.5" (1.867 m)  Wt 238 lb 12.8 oz (108.319 kg)  BMI 31.08 kg/m2  SpO2 100%    Objective:   Physical Exam  Constitutional: He is oriented to person, place, and time. He appears well-developed and well-nourished. No distress.  HENT:  Head: Normocephalic and atraumatic.  Cardiovascular: Normal rate and regular rhythm.   No murmur heard. Pulmonary/Chest: Effort normal and breath sounds normal. No respiratory distress. He has no wheezes. He has no rales.  Musculoskeletal: He exhibits no edema.  Neurological: He is alert and oriented to person, place, and time.  Skin: Skin is warm and dry.  Psychiatric: He has a normal mood and affect. His behavior is normal. Thought content normal.          Assessment & Plan:  Pt to keep upcoming  cardiac testing appointments.

## 2015-10-19 NOTE — Assessment & Plan Note (Signed)
Uncontrolled. Increase toprol xl from 50mg  to 100mg . Follow up in 2 weeks for a nurse visit.

## 2015-10-27 ENCOUNTER — Other Ambulatory Visit (HOSPITAL_COMMUNITY): Payer: BLUE CROSS/BLUE SHIELD

## 2015-10-27 ENCOUNTER — Encounter: Payer: BLUE CROSS/BLUE SHIELD | Admitting: Physician Assistant

## 2015-10-27 ENCOUNTER — Ambulatory Visit (HOSPITAL_COMMUNITY): Payer: BLUE CROSS/BLUE SHIELD | Attending: Cardiology

## 2015-10-27 DIAGNOSIS — R0989 Other specified symptoms and signs involving the circulatory and respiratory systems: Secondary | ICD-10-CM

## 2015-10-28 ENCOUNTER — Encounter: Payer: Self-pay | Admitting: Physician Assistant

## 2015-11-03 ENCOUNTER — Encounter (HOSPITAL_COMMUNITY): Payer: Self-pay

## 2016-01-20 ENCOUNTER — Telehealth: Payer: Self-pay | Admitting: Family

## 2016-01-20 ENCOUNTER — Ambulatory Visit: Payer: Self-pay | Admitting: Family

## 2016-01-21 ENCOUNTER — Encounter: Payer: Self-pay | Admitting: Family

## 2016-01-21 NOTE — Telephone Encounter (Signed)
Message routed to [employer] and [employer].

## 2016-01-21 NOTE — Telephone Encounter (Signed)
Pt was no show 01/20/16 8:45am for follow up appt, 1st no show I see, pt has not rescheduled, charge or no charge?

## 2016-01-21 NOTE — Telephone Encounter (Signed)
Yes please

## 2016-01-21 NOTE — Telephone Encounter (Signed)
Marked to charge and mailing no show letter °

## 2016-03-17 ENCOUNTER — Emergency Department (HOSPITAL_BASED_OUTPATIENT_CLINIC_OR_DEPARTMENT_OTHER): Payer: 59

## 2016-03-17 ENCOUNTER — Encounter (HOSPITAL_BASED_OUTPATIENT_CLINIC_OR_DEPARTMENT_OTHER): Payer: Self-pay | Admitting: Emergency Medicine

## 2016-03-17 ENCOUNTER — Observation Stay (HOSPITAL_BASED_OUTPATIENT_CLINIC_OR_DEPARTMENT_OTHER)
Admission: EM | Admit: 2016-03-17 | Discharge: 2016-03-19 | Disposition: A | Discharge status: Home / Self Care | Payer: 59 | Attending: Internal Medicine | Admitting: Internal Medicine

## 2016-03-17 DIAGNOSIS — R110 Nausea: Secondary | ICD-10-CM | POA: Diagnosis not present

## 2016-03-17 DIAGNOSIS — E876 Hypokalemia: Secondary | ICD-10-CM | POA: Diagnosis not present

## 2016-03-17 DIAGNOSIS — Z87442 Personal history of urinary calculi: Secondary | ICD-10-CM | POA: Insufficient documentation

## 2016-03-17 DIAGNOSIS — J020 Streptococcal pharyngitis: Secondary | ICD-10-CM | POA: Insufficient documentation

## 2016-03-17 DIAGNOSIS — R079 Chest pain, unspecified: Secondary | ICD-10-CM | POA: Diagnosis present

## 2016-03-17 DIAGNOSIS — I4581 Long QT syndrome: Secondary | ICD-10-CM | POA: Insufficient documentation

## 2016-03-17 DIAGNOSIS — Z8249 Family history of ischemic heart disease and other diseases of the circulatory system: Secondary | ICD-10-CM | POA: Insufficient documentation

## 2016-03-17 DIAGNOSIS — I10 Essential (primary) hypertension: Secondary | ICD-10-CM | POA: Diagnosis not present

## 2016-03-17 DIAGNOSIS — R0981 Nasal congestion: Secondary | ICD-10-CM

## 2016-03-17 DIAGNOSIS — I160 Hypertensive urgency: Secondary | ICD-10-CM

## 2016-03-17 DIAGNOSIS — G43909 Migraine, unspecified, not intractable, without status migrainosus: Secondary | ICD-10-CM | POA: Insufficient documentation

## 2016-03-17 LAB — CBC WITH DIFFERENTIAL/PLATELET
BASOS PCT: 1 %
Basophils Absolute: 0.1 10*3/uL (ref 0.0–0.1)
Eosinophils Absolute: 0.1 10*3/uL (ref 0.0–0.7)
Eosinophils Relative: 1 %
HEMATOCRIT: 43.3 % (ref 39.0–52.0)
HEMOGLOBIN: 15.7 g/dL (ref 13.0–17.0)
LYMPHS ABS: 1.1 10*3/uL (ref 0.7–4.0)
Lymphocytes Relative: 13 %
MCH: 31.3 pg (ref 26.0–34.0)
MCHC: 36.3 g/dL — AB (ref 30.0–36.0)
MCV: 86.3 fL (ref 78.0–100.0)
MONO ABS: 0.5 10*3/uL (ref 0.1–1.0)
MONOS PCT: 6 %
NEUTROS ABS: 6.6 10*3/uL (ref 1.7–7.7)
NEUTROS PCT: 79 %
Platelets: 242 10*3/uL (ref 150–400)
RBC: 5.02 MIL/uL (ref 4.22–5.81)
RDW: 13.4 % (ref 11.5–15.5)
WBC: 8.4 10*3/uL (ref 4.0–10.5)

## 2016-03-17 LAB — URINALYSIS, ROUTINE W REFLEX MICROSCOPIC
Bilirubin Urine: NEGATIVE
GLUCOSE, UA: NEGATIVE mg/dL
HGB URINE DIPSTICK: NEGATIVE
Ketones, ur: NEGATIVE mg/dL
LEUKOCYTES UA: NEGATIVE
Nitrite: NEGATIVE
PH: 7 (ref 5.0–8.0)
Protein, ur: NEGATIVE mg/dL
SPECIFIC GRAVITY, URINE: 1.014 (ref 1.005–1.030)

## 2016-03-17 LAB — COMPREHENSIVE METABOLIC PANEL
ALBUMIN: 4.6 g/dL (ref 3.5–5.0)
ALK PHOS: 96 U/L (ref 38–126)
ALT: 22 U/L (ref 17–63)
AST: 32 U/L (ref 15–41)
Anion gap: 8 (ref 5–15)
BILIRUBIN TOTAL: 1.9 mg/dL — AB (ref 0.3–1.2)
BUN: 13 mg/dL (ref 6–20)
CALCIUM: 9.6 mg/dL (ref 8.9–10.3)
CO2: 27 mmol/L (ref 22–32)
Chloride: 101 mmol/L (ref 101–111)
Creatinine, Ser: 1.07 mg/dL (ref 0.61–1.24)
GFR calc Af Amer: >60 mL/min (ref >60)
GFR calc non Af Amer: >60 mL/min (ref >60)
GLUCOSE: 97 mg/dL (ref 65–99)
POTASSIUM: 3.3 mmol/L — AB (ref 3.5–5.1)
SODIUM: 136 mmol/L (ref 135–145)
TOTAL PROTEIN: 8.3 g/dL — AB (ref 6.5–8.1)

## 2016-03-17 LAB — LIPID PANEL
Cholesterol: 187 mg/dL (ref 0–200)
HDL: 77 mg/dL (ref >40)
LDL CALC: 94 mg/dL (ref 0–99)
TRIGLYCERIDES: 79 mg/dL (ref <150)
Total CHOL/HDL Ratio: 2.4 RATIO
VLDL: 16 mg/dL (ref 0–40)

## 2016-03-17 LAB — RAPID URINE DRUG SCREEN, HOSP PERFORMED
AMPHETAMINES: NOT DETECTED
BARBITURATES: NOT DETECTED
BENZODIAZEPINES: NOT DETECTED
Cocaine: NOT DETECTED
Opiates: NOT DETECTED
TETRAHYDROCANNABINOL: NOT DETECTED

## 2016-03-17 LAB — BRAIN NATRIURETIC PEPTIDE: B Natriuretic Peptide: 15.9 pg/mL (ref 0.0–100.0)

## 2016-03-17 LAB — MRSA PCR SCREENING: MRSA by PCR: NEGATIVE

## 2016-03-17 LAB — D-DIMER, QUANTITATIVE (NOT AT ARMC)

## 2016-03-17 LAB — RAPID STREP SCREEN (MED CTR MEBANE ONLY): Streptococcus, Group A Screen (Direct): POSITIVE — AB

## 2016-03-17 LAB — TROPONIN I
Troponin I: <0.03 ng/mL (ref <0.031)
Troponin I: <0.03 ng/mL (ref <0.031)

## 2016-03-17 MED ORDER — ACETAMINOPHEN 325 MG PO TABS
650.0000 mg | ORAL_TABLET | ORAL | Status: DC | PRN
  Administered 2016-03-18: 650 mg via ORAL
  Filled 2016-03-17: qty 2

## 2016-03-17 MED ORDER — POTASSIUM CHLORIDE CRYS ER 20 MEQ PO TBCR
60.0000 meq | EXTENDED_RELEASE_TABLET | Freq: Once | ORAL | Status: AC
  Administered 2016-03-17: 60 meq via ORAL
  Filled 2016-03-17: qty 3

## 2016-03-17 MED ORDER — GI COCKTAIL ~~LOC~~: 30.0000 mL | Freq: Four times a day (QID) | ORAL | Status: DC | PRN

## 2016-03-17 MED ORDER — ONDANSETRON HCL 4 MG/2ML IJ SOLN
4.0000 mg | Freq: Once | INTRAMUSCULAR | Status: AC
  Administered 2016-03-17: 4 mg via INTRAVENOUS

## 2016-03-17 MED ORDER — MORPHINE SULFATE (PF) 4 MG/ML IV SOLN
4.0000 mg | Freq: Once | INTRAVENOUS | Status: AC
  Administered 2016-03-17: 4 mg via INTRAVENOUS
  Filled 2016-03-17: qty 1

## 2016-03-17 MED ORDER — HYDROCHLOROTHIAZIDE 25 MG PO TABS
25.0000 mg | ORAL_TABLET | Freq: Every day | ORAL | Status: DC
  Administered 2016-03-18 – 2016-03-19 (×2): 25 mg via ORAL
  Filled 2016-03-17 (×2): qty 1

## 2016-03-17 MED ORDER — VALSARTAN-HYDROCHLOROTHIAZIDE 320-25 MG PO TABS: 1.0000 | ORAL_TABLET | Freq: Every day | ORAL | Status: DC

## 2016-03-17 MED ORDER — PENICILLIN G BENZATHINE 1200000 UNIT/2ML IM SUSP
1.2000 10*6.[IU] | Freq: Once | INTRAMUSCULAR | Status: AC
  Administered 2016-03-17: 1.2 10*6.[IU] via INTRAMUSCULAR
  Filled 2016-03-17: qty 2

## 2016-03-17 MED ORDER — NITROGLYCERIN IN D5W 200-5 MCG/ML-% IV SOLN
0.0000 ug/min | INTRAVENOUS | Status: DC
  Administered 2016-03-17: 5 ug/min via INTRAVENOUS
  Filled 2016-03-17: qty 250

## 2016-03-17 MED ORDER — LABETALOL HCL 5 MG/ML IV SOLN
10.0000 mg | Freq: Once | INTRAVENOUS | Status: AC
  Administered 2016-03-17: 10 mg via INTRAVENOUS
  Filled 2016-03-17: qty 4

## 2016-03-17 MED ORDER — LORATADINE 10 MG PO TABS
10.0000 mg | ORAL_TABLET | Freq: Every day | ORAL | Status: DC
  Administered 2016-03-17 – 2016-03-18 (×2): 10 mg via ORAL
  Filled 2016-03-17 (×2): qty 1

## 2016-03-17 MED ORDER — MORPHINE SULFATE (PF) 2 MG/ML IV SOLN
2.0000 mg | INTRAVENOUS | Status: DC | PRN
  Administered 2016-03-18: 2 mg via INTRAVENOUS
  Filled 2016-03-17: qty 1

## 2016-03-17 MED ORDER — HYDRALAZINE HCL 20 MG/ML IJ SOLN
10.0000 mg | INTRAMUSCULAR | Status: DC | PRN
  Administered 2016-03-17 – 2016-03-18 (×4): 10 mg via INTRAVENOUS
  Filled 2016-03-17 (×4): qty 1

## 2016-03-17 MED ORDER — ASPIRIN 81 MG PO CHEW
324.0000 mg | CHEWABLE_TABLET | Freq: Once | ORAL | Status: AC
  Administered 2016-03-17: 324 mg via ORAL
  Filled 2016-03-17: qty 4

## 2016-03-17 MED ORDER — ENOXAPARIN SODIUM 40 MG/0.4ML ~~LOC~~ SOLN
40.0000 mg | SUBCUTANEOUS | Status: DC
  Administered 2016-03-17 – 2016-03-18 (×2): 40 mg via SUBCUTANEOUS
  Filled 2016-03-17 (×2): qty 0.4

## 2016-03-17 MED ORDER — GUAIFENESIN ER 600 MG PO TB12
600.0000 mg | ORAL_TABLET | Freq: Two times a day (BID) | ORAL | Status: DC
  Administered 2016-03-17 – 2016-03-18 (×2): 600 mg via ORAL
  Filled 2016-03-17 (×2): qty 1

## 2016-03-17 MED ORDER — NITROGLYCERIN 0.4 MG SL SUBL
0.4000 mg | SUBLINGUAL_TABLET | SUBLINGUAL | Status: DC | PRN
  Administered 2016-03-17 (×2): 0.4 mg via SUBLINGUAL
  Filled 2016-03-17 (×2): qty 1

## 2016-03-17 MED ORDER — ONDANSETRON HCL 4 MG/2ML IJ SOLN
4.0000 mg | Freq: Four times a day (QID) | INTRAMUSCULAR | Status: DC | PRN
  Administered 2016-03-17 – 2016-03-18 (×2): 4 mg via INTRAVENOUS
  Filled 2016-03-17 (×2): qty 2

## 2016-03-17 MED ORDER — AMLODIPINE BESYLATE 10 MG PO TABS
10.0000 mg | ORAL_TABLET | Freq: Every day | ORAL | Status: DC
  Administered 2016-03-18 – 2016-03-19 (×2): 10 mg via ORAL
  Filled 2016-03-17 (×2): qty 1

## 2016-03-17 MED ORDER — LABETALOL HCL 100 MG PO TABS
100.0000 mg | ORAL_TABLET | Freq: Once | ORAL | Status: DC
  Filled 2016-03-17: qty 1

## 2016-03-17 MED ORDER — ONDANSETRON HCL 4 MG/2ML IJ SOLN
INTRAMUSCULAR | Status: AC
  Administered 2016-03-17: 4 mg via INTRAVENOUS
  Filled 2016-03-17: qty 2

## 2016-03-17 MED ORDER — ACETAMINOPHEN 500 MG PO TABS
1000.0000 mg | ORAL_TABLET | Freq: Once | ORAL | Status: AC
  Administered 2016-03-17: 1000 mg via ORAL
  Filled 2016-03-17: qty 2

## 2016-03-17 MED ORDER — METOPROLOL SUCCINATE ER 100 MG PO TB24
100.0000 mg | ORAL_TABLET | Freq: Every day | ORAL | Status: DC
  Administered 2016-03-18 – 2016-03-19 (×2): 100 mg via ORAL
  Filled 2016-03-17 (×2): qty 1

## 2016-03-17 MED ORDER — IRBESARTAN 300 MG PO TABS
300.0000 mg | ORAL_TABLET | Freq: Every day | ORAL | Status: DC
  Administered 2016-03-18 – 2016-03-19 (×2): 300 mg via ORAL
  Filled 2016-03-17 (×2): qty 1

## 2016-03-17 NOTE — Progress Notes (Signed)
[facility]  C/o chest pain/headache-- BP high-- will need SDU for on going CP and possible need for IV BP med.  UDS pending.  Marlin CanaryJessica Trystyn Sitts DO

## 2016-03-17 NOTE — ED Notes (Signed)
Patient wheeled to room 7 via wheelchair, Patient undressed into gown, placed on cardiac monitor set for every 30  Minute vitals.

## 2016-03-17 NOTE — ED Notes (Addendum)
Patient complaining of increased headache. Dr. Madilyn Hookees made aware. Dr. Madilyn Hookees also made aware of patients BP. New orders given.

## 2016-03-17 NOTE — ED Notes (Signed)
Pt states he has had severe headache and  Heaviness in chest x 5 days, denies radiation, +sob, dizziness, nausea

## 2016-03-17 NOTE — H&P (Signed)
History and Physical    Taylor Herrera WUJ:[id number] DOB: [birth year] DOA: 03/17/2016  Referring MD/NP/PA: [facility] PCP: Nance Pear., NP  Outpatient Specialists: None Patient coming from: Home  Chief Complaint: Chest pain and headache  HPI: Taylor Herrera is a 40 y.o. male with medical history significant of hypertension, allergies, migraines, and family history of heart disease; who presents with complaints of chest pain and headache. Symptoms have been progressively worsening over the last 5-7 days. Expresses having a dull pain over his chest that does not radiate. Pain is more on the left side than the right. Pain worsens with deep inspiration. Also, complains of pain noted a crampy-like feeling under the physical left rib cage. His headache is in frontal region. Headache is associated with sinus pressure and congestion. Over the past day has had associated symptoms of nausea, but denies any vomiting. He notes that he had been on his blood pressure medications over the last year, but stopped them over the last month. His primary care physician wanted to get a refill on her stress test because of his blood pressure issues but he never followed up to have this done. Associated symptoms include swollen lymph nodes of his neck, sputum production, intermittent diaphoresis, lightheadedness with changes in position, Denies having any recent travel, sick contacts, recent trauma, cough, leg swelling, diarrhea, dysuria, urinary frequency, shortness of breath, abdominal pain, or rash. Patient notes his dad had CHF.  ED Course: Upon admission patient was evaluated and seen to be afebrile, pulse 52- 96, respirations 13-23, blood pressure as high as 209/129, and O2 saturations maintained.. Initial lab work revealed normal CBC, potassium 3.3, BNP 15.9, troponin negative,  d-dimer negative.  Urinalysis within normal limits, UDS negative, CT of the brain negative, and chest x-ray show no acute abnormalities.  Patient was started on a nitroglycerin drip for continued chest pain and elevated blood pressure. Patient notes chest pain symptoms resolved prior to transport to [facility].   Review of Systems: As per HPI otherwise 10 point review of systems negative.    Past Medical History  Diagnosis Date  . Migraine   . Hypertension   . History of kidney stones     History reviewed. No pertinent past surgical history.   reports that he has never smoked. He has never used smokeless tobacco. He reports that he does not drink alcohol or use illicit drugs.  No Active Allergies  Family History  Problem Relation Age of Onset  . Cancer Mother     multiple myeloma, ovarian  . Hypertension Mother   . Hypertension Father   . Arrhythmia Father     Prior to Admission medications   Medication Sig Start Date End Date Taking? Authorizing Provider  amLODipine (NORVASC) 10 MG tablet Take 1 tablet (10 mg total) by mouth daily. 09/16/15  Yes [name], NP  valsartan-hydrochlorothiazide (DIOVAN-HCT) 320-25 MG tablet Take 1 tablet by mouth daily. 09/16/15  Yes [name], NP  metoprolol succinate (TOPROL-XL) 100 MG 24 hr tablet Take 1 tablet (100 mg total) by mouth daily. Take with or immediately following a meal. 10/19/15   [name], NP    Physical Exam: Filed Vitals:   03/17/16 1730 03/17/16 1745 03/17/16 1800 03/17/16 1953  BP: 189/125 192/125 177/135 189/119  Pulse: 73 72 78 71  Temp:    97.9 F (36.6 C)  TempSrc:    Oral  Resp: 17 18 20 15   Height:      Weight:      SpO2:  98% 100% 100% 98%      Constitutional: NAD, calm, comfortable Filed Vitals:   03/17/16 1730 03/17/16 1745 03/17/16 1800 03/17/16 1953  BP: 189/125 192/125 177/135 189/119  Pulse: 73 72 78 71  Temp:    97.9 F (36.6 C)  TempSrc:    Oral  Resp: 17 18 20 15   Height:      Weight:      SpO2: 98% 100% 100% 98%   Eyes: PERRL, lids and conjunctivae normal ENMT: Mucous membranes are moist,  enlarged tonsils with possible exudate present(decreased visualization even with tongue depressor), posterior oropharynx drainage, tenderness to palpation of the frontal and maxillary sinuses  Neck: normal, supple, no masses, no thyromegaly, no JVD, enlarged submandibular lymph nodes  Respiratory: clear to auscultation bilaterally, no wheezing, no crackles. Normal respiratory effort. No accessory muscle use.  Cardiovascular: Regular rate and rhythm, no murmurs / rubs / gallops. No extremity edema. 2+ pedal pulses. No carotid bruits.  Abdomen: no tenderness, no masses palpated. No hepatosplenomegaly. Bowel sounds positive.  Musculoskeletal: no clubbing / cyanosis. No joint deformity upper and lower extremities. Good ROM, no contractures. Normal muscle tone.  Skin: Skin is warm and diaphoretic. no rashes, lesions, ulcers. No induration.  Neurologic: CN 2-12 grossly intact. Sensation intact, DTR normal. Strength 5/5 in all 4.  Psychiatric: Normal judgment and insight. Alert and oriented x 3. Normal mood.   Labs on Admission: I have personally reviewed following labs and imaging studies  CBC:  Recent Labs Lab 03/17/16 0930  WBC 8.4  NEUTROABS 6.6  HGB 15.7  HCT 43.3  MCV 86.3  PLT 962   Basic Metabolic Panel:  Recent Labs Lab 03/17/16 0930  NA 136  K 3.3*  CL 101  CO2 27  GLUCOSE 97  BUN 13  CREATININE 1.07  CALCIUM 9.6   GFR: Estimated Creatinine Clearance: 121.7 mL/min (by C-G formula based on Cr of 1.07). Liver Function Tests:  Recent Labs Lab 03/17/16 0930  AST 32  ALT 22  ALKPHOS 96  BILITOT 1.9*  PROT 8.3*  ALBUMIN 4.6   No results for input(s): LIPASE, AMYLASE in the last 168 hours. No results for input(s): AMMONIA in the last 168 hours. Coagulation Profile: No results for input(s): INR, PROTIME in the last 168 hours. Cardiac Enzymes:  Recent Labs Lab 03/17/16 0930  TROPONINI <0.03   BNP (last 3 results) No results for input(s): PROBNP in the last  8760 hours. HbA1C: No results for input(s): HGBA1C in the last 72 hours. CBG: No results for input(s): GLUCAP in the last 168 hours. Lipid Profile: No results for input(s): CHOL, HDL, LDLCALC, TRIG, CHOLHDL, LDLDIRECT in the last 72 hours. Thyroid Function Tests: No results for input(s): TSH, T4TOTAL, FREET4, T3FREE, THYROIDAB in the last 72 hours. Anemia Panel: No results for input(s): VITAMINB12, FOLATE, FERRITIN, TIBC, IRON, RETICCTPCT in the last 72 hours. Urine analysis:    Component Value Date/Time   COLORURINE YELLOW 03/17/2016 [place] 03/17/2016 1234   LABSPEC 1.014 03/17/2016 1234   PHURINE 7.0 03/17/2016 1234   GLUCOSEU NEGATIVE 03/17/2016 1234   GLUCOSEU NEGATIVE 04/23/2015 1357   HGBUR NEGATIVE 03/17/2016 [place] 03/17/2016 [place] 03/17/2016 1234   PROTEINUR NEGATIVE 03/17/2016 1234   UROBILINOGEN 0.2 04/23/2015 1357   NITRITE NEGATIVE 03/17/2016 1234   LEUKOCYTESUR NEGATIVE 03/17/2016 1234   Sepsis Labs: @LABRCNTIP (procalcitonin:4,lacticidven:4) )No results found for this or any previous visit (from the past 240 hour(s)).  Radiological Exams on Admission: Dg Chest 2 View  03/17/2016  CLINICAL DATA:  Acute shortness of breath, chest discomfort and dizziness for 1 week. EXAM: CHEST  2 VIEW COMPARISON:  10/05/2015 FINDINGS: The heart size and mediastinal contours are within normal limits. Both lungs are clear. The visualized skeletal structures are unremarkable. IMPRESSION: No active cardiopulmonary disease. Electronically Signed   By: Jerilynn Mages.  Shick M.D.   On: 03/17/2016 10:04   Ct Head Wo Contrast  03/17/2016  CLINICAL DATA:  Severe headaches with high blood pressure EXAM: CT HEAD WITHOUT CONTRAST TECHNIQUE: Contiguous axial images were obtained from the base of the skull through the vertex without intravenous contrast. COMPARISON:  None. FINDINGS: The bony calvarium is intact. The ventricles are of normal size and  configuration. No findings to suggest acute hemorrhage, acute infarction or space-occupying mass lesion are noted. IMPRESSION: No acute intracranial abnormality noted. Electronically Signed   By: Inez Catalina M.D.   On: 03/17/2016 12:45    EKG: Independently reviewed. Sinus rhythm with borderline T-wave abnormalities  Assessment/Plan Chest pain: Acute. Patient reports having  pain across chest with risk factors including uncontrolled hypertension and possible family history heart disease. Initial EKG and troponins appear to be  unremarkable for signs of ischemia. Heart scores 3. - Admit to stepdown for active chest pain telemetry monitoring. - Trend cardiac troponin - Nitroglycerin drip - Check echocardiogram in a.m. - Morphine prn  - Consult cardiology in a.m. for further evaluation and possible need of stress test.  Sinus congestion: Patient with submandibular lymphadenopathy, and bilateral tonsillar enlargement with exudate, and denies cough, and therefore meets most criteria for possibility of strep throat. - check strep throat if positive will treat with penicillin g IM x1 dose to reduce possible compliance issues - Claritin  - Mucinex    Nausea - Zofran prn  Hypertension: uncontrolled. It has not been on blood pressure medications in over a month - Continue home blood pressure medications of metoprolol, Diovan, and amlodipine - Hydralazine IV prn - Wean nitroglycerin drip off  Hypokalemia: Patient given 60 mEq of potassium chloride in the ED prior to transfer. - Recheck Bmp in a.m. - Replace potassium as needed  DVT prophylaxis: Lovenox  Code Status: Full Family Communication: Discussed plan with patient's girlfriend Disposition Plan: possible discharge in 1-2 days Consults called: none Admission status: observation sdu   Norval Morton MD [facility] Pager [phone]  If 7PM-7AM, please contact night-coverage [email or web address] Password [facility]  03/17/2016,  7:56 PM

## 2016-03-17 NOTE — ED Notes (Signed)
Report given to [facility]lie[facility].

## 2016-03-17 NOTE — ED Provider Notes (Signed)
CSN: [id number]     Arrival date & time 03/17/16  0906 History   First MD Initiated Contact with Patient 03/17/16 0920     Chief Complaint  Patient presents with  . Chest Pain  . Headache     HPI  Taylor Herrera is an 40 y.o. male with history of poorly controlled HTN who presents to the ED for evaluation of chest pain and headache. He states his symptoms began about 5-7 days ago and have progressively been getting worse. He states his chest feels "congested" and "blocked up." He states it is a dull pain and is all over his chest with no radiation. He reports the headache is in his frontal region and constant. States he did feel temporarily nauseated yesterday with no emesis. He states his vision has also intermittently felt "unfocused" over the past couple of days. Denies new numbness or weakness. He endorses intermittent diaphroesis at home. States he feels like he is short of breath. He states his symptoms are not associated with exertion and can occur at rest. States he has also intermittently felt lightheaded and dizzy, particularly when standing up. Denies LOC. He states he has been taking his BP medications as prescribed. He does endorse a history of poorly controlled HTN and was scheduled for an echocardiogram and exercise stress test last Fall; he states he was unable to have either evaluation due to lack of insurance coverage. Denies new swelling. Denies leg pain. Denies tobacco use, recent travel, h/o blood clots or malignancy. States he does have fam hx of cardiac issues--father has heart dz and CHF.  Past Medical History  Diagnosis Date  . Migraine   . Hypertension   . History of kidney stones    History reviewed. No pertinent past surgical history. Family History  Problem Relation Age of Onset  . Cancer Mother     multiple myeloma, ovarian  . Hypertension Mother   . Hypertension Father   . Arrhythmia Father    Social History  Substance Use Topics  . Smoking status: Never  Smoker   . Smokeless tobacco: Never Used  . Alcohol Use: No     Comment: occasional    Review of Systems  All other systems reviewed and are negative.     Allergies  Review of patient's allergies indicates no active allergies.  Home Medications   Prior to Admission medications   Medication Sig Start Date End Date Taking? Authorizing Provider  amLODipine (NORVASC) 10 MG tablet Take 1 tablet (10 mg total) by mouth daily. 09/16/15  Yes [name], NP  valsartan-hydrochlorothiazide (DIOVAN-HCT) 320-25 MG tablet Take 1 tablet by mouth daily. 09/16/15  Yes [name], NP  metoprolol succinate (TOPROL-XL) 100 MG 24 hr tablet Take 1 tablet (100 mg total) by mouth daily. Take with or immediately following a meal. 10/19/15   [name], NP   BP 199/141 mmHg  Pulse 88  Temp(Src) 98.5 F (36.9 C) (Oral)  Resp 18  Ht 6' 2" (1.88 m)  Wt 104.327 kg  BMI 29.52 kg/m2  SpO2 99% Physical Exam  Constitutional: He is oriented to person, place, and time. No distress.  HENT:  Right Ear: External ear normal.  Left Ear: External ear normal.  Nose: Nose normal.  Mouth/Throat: Oropharynx is clear and moist. No oropharyngeal exudate.  Eyes: Conjunctivae and EOM are normal. Pupils are equal, round, and reactive to light.  Neck: Normal range of motion. Neck supple.  Cardiovascular: Normal rate, regular rhythm, normal heart sounds and  intact distal pulses.   Pulmonary/Chest: Effort normal and breath sounds normal. No respiratory distress.  No increased WOB, no tachypnea No wheezes, rhonchi, or rales  Abdominal: Soft. Bowel sounds are normal. He exhibits no distension. There is no tenderness.  Musculoskeletal: He exhibits no edema.  No LE edema  Neurological: He is alert and oriented to person, place, and time. No cranial nerve deficit.  Skin: Skin is warm and dry. He is not diaphoretic.  Psychiatric: He has a normal mood and affect.  Nursing note and vitals  reviewed.   ED Course  Procedures (including critical care time) Labs Review Labs Reviewed  COMPREHENSIVE METABOLIC PANEL - Abnormal; Notable for the following:    Potassium 3.3 (*)    Total Protein 8.3 (*)    Total Bilirubin 1.9 (*)    All other components within normal limits  CBC WITH DIFFERENTIAL/PLATELET - Abnormal; Notable for the following:    MCHC 36.3 (*)    All other components within normal limits  BRAIN NATRIURETIC PEPTIDE  TROPONIN I  D-DIMER, QUANTITATIVE (NOT AT [facility])  URINALYSIS, ROUTINE W REFLEX MICROSCOPIC (NOT AT [facility])  URINE RAPID DRUG SCREEN, HOSP PERFORMED    Imaging Review Dg Chest 2 View  03/17/2016  CLINICAL DATA:  Acute shortness of breath, chest discomfort and dizziness for 1 week. EXAM: CHEST  2 VIEW COMPARISON:  10/05/2015 FINDINGS: The heart size and mediastinal contours are within normal limits. Both lungs are clear. The visualized skeletal structures are unremarkable. IMPRESSION: No active cardiopulmonary disease. Electronically Signed   By: Jerilynn Mages.  Shick M.D.   On: 03/17/2016 10:04   I have personally reviewed and evaluated these images and lab results as part of my medical decision-making.   EKG Interpretation   Date/Time:  Thursday March 17 2016 09:18:28 EDT Ventricular Rate:  83 PR Interval:  140 QRS Duration: 134 QT Interval:  388 QTC Calculation: 456 R Axis:   58 Text Interpretation:  Sinus rhythm Probable left atrial enlargement  Borderline T abnormalities, inferior leads Nonspecific T wave abnormality  No significant change since last tracing Confirmed by [facility] MD, [place]  ([id number]) on 03/17/2016 11:15:50 AM      MDM   Final diagnoses:  Hypertensive urgency  Chest pain, unspecified chest pain type    Labs with mild hypokalemia of 3.3 otherwise unremarkable. Troponin and d-dimer negative. CXR negative. EKG unchanged from prior. Pt reports some improvement in chest discomfort with nitro, though states he still feels congested.  Headache persits. BP persistently in 180s/120s-130s. Suspect symptomatic hypertension. Will give labetolol. Will replete potassium.   BP mildly improved though still elevated to 170s/120s. Pt states his chest continues to feel pressure. Headache still present. Neurologic exam remains intact. However, given symptomatic hypertensive urgency will call hospitalists for medical admission.  I spoke to Dr. Eliseo Squires who will admit pt to stepdown. CT head negative. UDS negative.  Anne Ng, PA-C 03/17/16 1327  Gareth Morgan, MD 03/17/16 [phone]

## 2016-03-17 NOTE — ED Notes (Signed)
Spoke with Sam-PA regarding patients blood pressure not changing on nitro drip.  Per PA to leave patient on nitro drip at this time.

## 2016-03-17 NOTE — ED Notes (Signed)
Patient states he is feeling the same as he came in. Complains of chest pain and headache. NAD noted.

## 2016-03-18 ENCOUNTER — Observation Stay (HOSPITAL_BASED_OUTPATIENT_CLINIC_OR_DEPARTMENT_OTHER): Payer: 59

## 2016-03-18 DIAGNOSIS — R0789 Other chest pain: Secondary | ICD-10-CM | POA: Diagnosis not present

## 2016-03-18 DIAGNOSIS — I4581 Long QT syndrome: Secondary | ICD-10-CM

## 2016-03-18 DIAGNOSIS — R0981 Nasal congestion: Secondary | ICD-10-CM | POA: Diagnosis present

## 2016-03-18 DIAGNOSIS — J020 Streptococcal pharyngitis: Secondary | ICD-10-CM | POA: Diagnosis not present

## 2016-03-18 DIAGNOSIS — R079 Chest pain, unspecified: Secondary | ICD-10-CM

## 2016-03-18 DIAGNOSIS — E876 Hypokalemia: Secondary | ICD-10-CM | POA: Diagnosis present

## 2016-03-18 DIAGNOSIS — I10 Essential (primary) hypertension: Secondary | ICD-10-CM | POA: Diagnosis present

## 2016-03-18 DIAGNOSIS — R110 Nausea: Secondary | ICD-10-CM | POA: Diagnosis present

## 2016-03-18 LAB — CBC WITH DIFFERENTIAL/PLATELET
BASOS ABS: 0 10*3/uL (ref 0.0–0.1)
BASOS PCT: 0 %
Eosinophils Absolute: 0 10*3/uL (ref 0.0–0.7)
Eosinophils Relative: 0 %
HEMATOCRIT: 39.4 % (ref 39.0–52.0)
HEMOGLOBIN: 13.5 g/dL (ref 13.0–17.0)
Lymphocytes Relative: 5 %
Lymphs Abs: 0.6 10*3/uL — ABNORMAL LOW (ref 0.7–4.0)
MCH: 30 pg (ref 26.0–34.0)
MCHC: 34.3 g/dL (ref 30.0–36.0)
MCV: 87.6 fL (ref 78.0–100.0)
Monocytes Absolute: 0.6 10*3/uL (ref 0.1–1.0)
Monocytes Relative: 5 %
NEUTROS ABS: 11 10*3/uL — AB (ref 1.7–7.7)
NEUTROS PCT: 90 %
Platelets: 215 10*3/uL (ref 150–400)
RBC: 4.5 MIL/uL (ref 4.22–5.81)
RDW: 13.5 % (ref 11.5–15.5)
WBC: 12.2 10*3/uL — AB (ref 4.0–10.5)

## 2016-03-18 LAB — ECHOCARDIOGRAM COMPLETE
HEIGHTINCHES: 74 in
Weight: 3680 oz

## 2016-03-18 LAB — TROPONIN I: Troponin I: <0.03 ng/mL (ref <0.031)

## 2016-03-18 MED ORDER — TRAMADOL HCL 50 MG PO TABS: 50.0000 mg | ORAL_TABLET | Freq: Four times a day (QID) | ORAL | Status: DC | PRN

## 2016-03-18 MED ORDER — POTASSIUM CHLORIDE CRYS ER 20 MEQ PO TBCR
40.0000 meq | EXTENDED_RELEASE_TABLET | Freq: Once | ORAL | Status: AC
  Administered 2016-03-18: 40 meq via ORAL
  Filled 2016-03-18: qty 2

## 2016-03-18 MED ORDER — ACETAMINOPHEN 325 MG PO TABS
650.0000 mg | ORAL_TABLET | ORAL | Status: DC | PRN
  Administered 2016-03-19: 650 mg via ORAL
  Filled 2016-03-18: qty 2

## 2016-03-18 MED ORDER — ISOSORB DINITRATE-HYDRALAZINE 20-37.5 MG PO TABS
1.0000 | ORAL_TABLET | Freq: Three times a day (TID) | ORAL | Status: DC
  Administered 2016-03-18 – 2016-03-19 (×3): 1 via ORAL
  Filled 2016-03-18 (×3): qty 1

## 2016-03-18 MED ORDER — PROCHLORPERAZINE EDISYLATE 5 MG/ML IJ SOLN
10.0000 mg | Freq: Four times a day (QID) | INTRAMUSCULAR | Status: DC | PRN
  Filled 2016-03-18: qty 2

## 2016-03-18 MED ORDER — OXYCODONE HCL 5 MG PO TABS: 5.0000 mg | ORAL_TABLET | ORAL | Status: DC | PRN

## 2016-03-18 NOTE — Progress Notes (Signed)
Echocardiogram 2D Echocardiogram has been performed.  Taylor Herrera, Taylor Herrera M 03/18/2016, 3:02 PM

## 2016-03-18 NOTE — Progress Notes (Signed)
[place] TEAM 1 - Stepdown/ICU TEAM PROGRESS NOTE  Martina SinnerHayward Goodspeed WGN:[id number] DOB: Aug 09, 1976 DOA: 03/17/2016 PCP: [place] Fillers'SULLIVAN,MELISSA S., NP  Admit HPI / Brief Narrative: 40 y.o. male with history of hypertension, allergies, migraines, and family history of heart disease who presented with complaints of dull L sided chest pain and headache worsening over 5-7 days. Headache was associated with sinus pressure and congestion. He had been on blood pressure medications over the last year, but stopped them over a month prior.   In the ED his blood pressure was 209/129.  D-dimer negative. UDS negative, CT of the brain negative, and chest x-ray show no acute abnormalities. His CP resolved w/ tx of his HTN.    HPI/Subjective: Pt states he feels much better.  BP upon entering room is 177/100.  He denies current cp, n/v, abdom pain, or sob.    Assessment/Plan:  Chest pain EKG w/o evidence to suggest acute ischemia - troponin negative x3 - suspect this may have been related to malignant HTN  QTc 499ms Not present on EKG yesterday, or in 2016 - ?due to zofran - d/c zofran - follow on tele - repeat EKG in AM - question pt on family hx of sudden cardiac death or unexplained syncope   Malignant HTN BP briefly better controlled, but now markedly elevated again - adjust medical tx and follow   Strep throat  submandibular lymphadenopathy, and bilateral tonsillar enlargement with exudate - treated w/ PCN G benzathine 1.842mil units x1  Mild Hypokalemia Replace and recheck in AM   Code Status: FULL Family Communication: no family present at time of exam Disposition Plan: transfer to tele bed - probable d/c in AM if BP improved and QT normalized   Consultants: none  Procedures: none  Antibiotics: PCN G 4/27  DVT prophylaxis: lovenox  Objective: Blood pressure 151/96, pulse 86, temperature 98.6 F (37 C), temperature source Oral, resp. rate 26, height 6\' 2"  (1.88 m), weight 104.327 kg  (230 lb), SpO2 99 %.  Intake/Output Summary (Last 24 hours) at 03/18/16 1321 Last data filed at 03/18/16 1000  Gross per 24 hour  Intake 142.97 ml  Output    500 ml  Net -357.03 ml    Exam: General: No acute respiratory distress Lungs: Clear to auscultation bilaterally without wheezes or crackles Cardiovascular: Regular rate and rhythm without murmur gallop or rub normal S1 and S2 Abdomen: Nontender, nondistended, soft, bowel sounds positive, no rebound, no ascites, no appreciable mass Extremities: No significant cyanosis, clubbing, or edema bilateral lower extremities  Data Reviewed:  Basic Metabolic Panel:  Recent Labs Lab 03/17/16 0930  NA 136  K 3.3*  CL 101  CO2 27  GLUCOSE 97  BUN 13  CREATININE 1.07  CALCIUM 9.6    CBC:  Recent Labs Lab 03/17/16 0930 03/18/16 0244  WBC 8.4 12.2*  NEUTROABS 6.6 11.0*  HGB 15.7 13.5  HCT 43.3 39.4  MCV 86.3 87.6  PLT 242 215    Liver Function Tests:  Recent Labs Lab 03/17/16 0930  AST 32  ALT 22  ALKPHOS 96  BILITOT 1.9*  PROT 8.3*  ALBUMIN 4.6   Cardiac Enzymes:  Recent Labs Lab 03/17/16 0930 03/17/16 2130 03/18/16 0005  TROPONINI <0.03 <0.03 <0.03    Recent Results (from the past 240 hour(s))  MRSA PCR Screening     Status: None   Collection Time: 03/17/16  8:17 PM  Result Value Ref Range Status   MRSA by PCR NEGATIVE NEGATIVE Final  Comment:        The GeneXpert MRSA Assay (FDA approved for NASAL specimens only), is one component of a comprehensive MRSA colonization surveillance program. It is not intended to diagnose MRSA infection nor to guide or monitor treatment for MRSA infections.   Rapid strep screen (not at [facility])     Status: Abnormal   Collection Time: 03/17/16  9:34 PM  Result Value Ref Range Status   Streptococcus, Group A Screen (Direct) POSITIVE (A) NEGATIVE Final     Studies:   Recent x-ray studies have been reviewed in detail by the Attending Physician  Scheduled  Meds:  Scheduled Meds: . amLODipine  10 mg Oral Daily  . enoxaparin (LOVENOX) injection  40 mg Subcutaneous Q24H  . guaiFENesin  600 mg Oral BID  . irbesartan  300 mg Oral Daily   And  . hydrochlorothiazide  25 mg Oral Daily  . loratadine  10 mg Oral Daily  . metoprolol succinate  100 mg Oral Daily    Time spent on care of this patient: 35 mins   Vivienne Sangiovanni T , MD   [facility] Office  [phone] Pager - Text Page per Loretha Stapler as per below:  On-Call/Text Page:      [email or web address]      password [email or web address]  If 7PM-7AM, please contact night-coverage [email or web address] Password [email or web address] 03/18/2016, 1:21 PM   LOS: 1 day

## 2016-03-19 DIAGNOSIS — I10 Essential (primary) hypertension: Secondary | ICD-10-CM | POA: Diagnosis not present

## 2016-03-19 DIAGNOSIS — R0789 Other chest pain: Secondary | ICD-10-CM | POA: Diagnosis not present

## 2016-03-19 DIAGNOSIS — E876 Hypokalemia: Secondary | ICD-10-CM | POA: Diagnosis not present

## 2016-03-19 LAB — BASIC METABOLIC PANEL
Anion gap: 12 (ref 5–15)
BUN: 17 mg/dL (ref 6–20)
CHLORIDE: 100 mmol/L — AB (ref 101–111)
CO2: 24 mmol/L (ref 22–32)
CREATININE: 1.48 mg/dL — AB (ref 0.61–1.24)
Calcium: 9.6 mg/dL (ref 8.9–10.3)
GFR, EST NON AFRICAN AMERICAN: 59 mL/min — AB (ref >60)
Glucose, Bld: 100 mg/dL — ABNORMAL HIGH (ref 65–99)
Potassium: 3.5 mmol/L (ref 3.5–5.1)
SODIUM: 136 mmol/L (ref 135–145)

## 2016-03-19 LAB — MAGNESIUM: Magnesium: 2.2 mg/dL (ref 1.7–2.4)

## 2016-03-19 MED ORDER — SODIUM CHLORIDE 0.9% FLUSH: 3.0000 mL | Freq: Two times a day (BID) | INTRAVENOUS | Status: DC

## 2016-03-19 MED ORDER — ISOSORB DINITRATE-HYDRALAZINE 20-37.5 MG PO TABS: 1.0000 | ORAL_TABLET | Freq: Three times a day (TID) | ORAL | Status: DC

## 2016-03-19 MED ORDER — VALSARTAN-HYDROCHLOROTHIAZIDE 320-25 MG PO TABS: 1.0000 | ORAL_TABLET | Freq: Every day | ORAL | Status: DC

## 2016-03-19 MED ORDER — METOPROLOL SUCCINATE ER 100 MG PO TB24: 100.0000 mg | ORAL_TABLET | Freq: Every day | ORAL | Status: DC

## 2016-03-19 MED ORDER — SODIUM CHLORIDE 0.9% FLUSH: 3.0000 mL | INTRAVENOUS | Status: DC | PRN

## 2016-03-19 MED ORDER — AMLODIPINE BESYLATE 10 MG PO TABS: 10.0000 mg | ORAL_TABLET | Freq: Every day | ORAL | Status: DC

## 2016-03-19 NOTE — Discharge Instructions (Signed)
Hypertension Hypertension, commonly called high blood pressure, is when the force of blood pumping through your arteries is too strong. Your arteries are the blood vessels that carry blood from your heart throughout your body. A blood pressure reading consists of a higher number over a lower number, such as 110/72. The higher number (systolic) is the pressure inside your arteries when your heart pumps. The lower number (diastolic) is the pressure inside your arteries when your heart relaxes. Ideally you want your blood pressure below 120/80. Hypertension forces your heart to work harder to pump blood. Your arteries may become narrow or stiff. Having untreated or uncontrolled hypertension can cause heart attack, stroke, kidney disease, and other problems. RISK FACTORS Some risk factors for high blood pressure are controllable. Others are not.  Risk factors you cannot control include:   Race. You may be at higher risk if you are African American.  Age. Risk increases with age.  Gender. Men are at higher risk than women before age 45 years. After age 65, women are at higher risk than men. Risk factors you can control include:  Not getting enough exercise or physical activity.  Being overweight.  Getting too much fat, sugar, calories, or salt in your diet.  Drinking too much alcohol. SIGNS AND SYMPTOMS Hypertension does not usually cause signs or symptoms. Extremely high blood pressure (hypertensive crisis) may cause headache, anxiety, shortness of breath, and nosebleed. DIAGNOSIS To check if you have hypertension, your health care provider will measure your blood pressure while you are seated, with your arm held at the level of your heart. It should be measured at least twice using the same arm. Certain conditions can cause a difference in blood pressure between your right and left arms. A blood pressure reading that is higher than normal on one occasion does not mean that you need treatment. If  it is not clear whether you have high blood pressure, you may be asked to return on a different day to have your blood pressure checked again. Or, you may be asked to monitor your blood pressure at home for 1 or more weeks. TREATMENT Treating high blood pressure includes making lifestyle changes and possibly taking medicine. Living a healthy lifestyle can help lower high blood pressure. You may need to change some of your habits. Lifestyle changes may include:  Following the DASH diet. This diet is high in fruits, vegetables, and whole grains. It is low in salt, red meat, and added sugars.  Keep your sodium intake below 2,300 mg per day.  Getting at least 30-45 minutes of aerobic exercise at least 4 times per week.  Losing weight if necessary.  Not smoking.  Limiting alcoholic beverages.  Learning ways to reduce stress. Your health care provider may prescribe medicine if lifestyle changes are not enough to get your blood pressure under control, and if one of the following is true:  You are 18-59 years of age and your systolic blood pressure is above 140.  You are 60 years of age or older, and your systolic blood pressure is above 150.  Your diastolic blood pressure is above 90.  You have diabetes, and your systolic blood pressure is over 140 or your diastolic blood pressure is over 90.  You have kidney disease and your blood pressure is above 140/90.  You have heart disease and your blood pressure is above 140/90. Your personal target blood pressure may vary depending on your medical conditions, your age, and other factors. HOME CARE INSTRUCTIONS    Have your blood pressure rechecked as directed by your health care provider.   Take medicines only as directed by your health care provider. Follow the directions carefully. Blood pressure medicines must be taken as prescribed. The medicine does not work as well when you skip doses. Skipping doses also puts you at risk for  problems.  Do not smoke.   Monitor your blood pressure at home as directed by your health care provider. SEEK MEDICAL CARE IF:   You think you are having a reaction to medicines taken.  You have recurrent headaches or feel dizzy.  You have swelling in your ankles.  You have trouble with your vision. SEEK IMMEDIATE MEDICAL CARE IF:  You develop a severe headache or confusion.  You have unusual weakness, numbness, or feel faint.  You have severe chest or abdominal pain.  You vomit repeatedly.  You have trouble breathing. MAKE SURE YOU:   Understand these instructions.  Will watch your condition.  Will get help right away if you are not doing well or get worse.   This information is not intended to replace advice given to you by your health care provider. Make sure you discuss any questions you have with your health care provider.   Document Released: 11/07/2005 Document Revised: 03/24/2015 Document Reviewed: 08/30/2013 Elsevier Interactive Patient Education 2016 [employer] Inc[employer]   Chest Pain Observation It is often hard to give a specific diagnosis for the cause of chest pain. Among other possibilities your symptoms might be caused by inadequate oxygen delivery to your heart (angina). Angina that is not treated or evaluated can lead to a heart attack (myocardial infarction) or death. Blood tests, electrocardiograms, and X-rays may have been done to help determine a possible cause of your chest pain. After evaluation and observation, your health care provider has determined that it is unlikely your pain was caused by an unstable condition that requires hospitalization. However, a full evaluation of your pain may need to be completed, with additional diagnostic testing as directed. It is very important to keep your follow-up appointments. Not keeping your follow-up appointments could result in permanent heart damage, disability, or death. If there is any problem keeping your  follow-up appointments, you must call your health care provider. HOME CARE INSTRUCTIONS  Due to the slight chance that your pain could be angina, it is important to follow your health care provider's treatment plan and also maintain a healthy lifestyle:  Maintain or work toward achieving a healthy weight.  Stay physically active and exercise regularly.  Decrease your salt intake.  Eat a balanced, healthy diet. Talk to a dietitian to learn about heart-healthy foods.  Increase your fiber intake by including whole grains, vegetables, fruits, and nuts in your diet.  Avoid situations that cause stress, anger, or depression.  Take medicines as advised by your health care provider. Report any side effects to your health care provider. Do not stop medicines or adjust the dosages on your own.  Quit smoking. Do not use nicotine patches or gum until you check with your health care provider.  Keep your blood pressure, blood sugar, and cholesterol levels within normal limits.  Limit alcohol intake to no more than 1 drink per day for women who are not pregnant and 2 drinks per day for men.  Do not abuse drugs. SEEK IMMEDIATE MEDICAL CARE IF: You have severe chest pain or pressure which may include symptoms such as:  You feel pain or pressure in your arms, neck, jaw, or back.  You have severe back or abdominal pain, feel sick to your stomach (nauseous), or throw up (vomit).  You are sweating profusely.  You are having a fast or irregular heartbeat.  You feel short of breath while at rest.  You notice increasing shortness of breath during rest, sleep, or with activity.  You have chest pain that does not get better after rest or after taking your usual medicine.  You wake from sleep with chest pain.  You are unable to sleep because you cannot breathe.  You develop a frequent cough or you are coughing up blood.  You feel dizzy, faint, or experience extreme fatigue.  You develop severe  weakness, dizziness, fainting, or chills. Any of these symptoms may represent a serious problem that is an emergency. Do not wait to see if the symptoms will go away. Call your local emergency services (911 in the [place] Do not drive yourself to the hospital. MAKE SURE YOU:  Understand these instructions.  Will watch your condition.  Will get help right away if you are not doing well or get worse.   This information is not intended to replace advice given to you by your health care provider. Make sure you discuss any questions you have with your health care provider.   Document Released: 12/10/2010 Document Revised: 11/12/2013 Document Reviewed: 05/09/2013 Elsevier Interactive Patient Education 2016 Elsevier Inc.   Chest Wall Pain Chest wall pain is pain in or around the bones and muscles of your chest. Sometimes, an injury causes this pain. Sometimes, the cause may not be known. This pain may take several weeks or longer to get better. HOME CARE INSTRUCTIONS  Pay attention to any changes in your symptoms. Take these actions to help with your pain:   Rest as told by your health care provider.   Avoid activities that cause pain. These include any activities that use your chest muscles or your abdominal and side muscles to lift heavy items.   If directed, apply ice to the painful area:  Put ice in a plastic bag.  Place a towel between your skin and the bag.  Leave the ice on for 20 minutes, 2-3 times per day.  Take over-the-counter and prescription medicines only as told by your health care provider.  Do not use tobacco products, including cigarettes, chewing tobacco, and e-cigarettes. If you need help quitting, ask your health care provider.  Keep all follow-up visits as told by your health care provider. This is important. SEEK MEDICAL CARE IF:  You have a fever.  Your chest pain becomes worse.  You have new symptoms. SEEK IMMEDIATE MEDICAL CARE IF:  You have nausea  or vomiting.  You feel sweaty or light-headed.  You have a cough with phlegm (sputum) or you cough up blood.  You develop shortness of breath.   This information is not intended to replace advice given to you by your health care provider. Make sure you discuss any questions you have with your health care provider.   Document Released: 11/07/2005 Document Revised: 07/29/2015 Document Reviewed: 02/02/2015 Elsevier Interactive Patient Education [employer]

## 2016-03-19 NOTE — Discharge Summary (Signed)
DISCHARGE SUMMARY  Taylor Herrera  MR#: [id number]  DOB:07-Aug-1976  Date of Admission: 03/17/2016 Date of Discharge: 03/19/2016  Attending Physician:Oday Ridings T  Patient's PCP:O'SULLIVAN,MELISSA S., NP  Consults:  none  Disposition: D/C home   Follow-up Appts:     Follow-up Information    Follow up with Lemont Fillers'SULLIVAN,MELISSA S., NP. Schedule an appointment as soon as possible for a visit in 1 week.   Specialty:  Internal Medicine   Contact information:   [address] [place] [id number] [phone]       Tests Needing Follow-up: -recheck of BP is indicated - consider further titrating meds, and maybe simplifying regimen if BP remains well controlled  -recheck EKG in office to assure no further evidence of prolonged QTc -rehceck of renal function is suggested w/ ongoing use of diuretic + ARB  Discharge Diagnoses: Chest pain QTc 499ms Malignant HTN Strep throat  Mild Hypokalemia  Initial presentation: 40 y.o. male with history of hypertension, allergies, migraines, and family history of heart disease who presented with complaints of dull L sided chest pain and headache worsening over 5-7 days. Headache was associated with sinus pressure and congestion. He had been on blood pressure medications over the last year, but stopped them over a month prior.   In the ED his blood pressure was 209/129. D-dimer negative. UDS negative, CT of the brain negative, and chest x-ray show no acute abnormalities. His CP resolved w/ tx of his HTN.   Hospital Course:  Chest pain EKG w/o evidence to suggest acute ischemia - troponin negative x3 - suspect this may have been related to malignant HTN - no WMA on TTE - chest pain entirely resolved at time of d/c   QTc 499ms Noted on EKG 4/28 - not present on EKG 4/27, or in 2016 - QTc 419 via EKG at time of d/c - ?due to zofran - resolved w/ d/c of zofran - no family hx of sudden cardiac death or unexplained syncope  per pt - suggest f/u w/ pt in office w/ repeat EKG   Malignant HTN BP controlled w/ adjustment in medical tx and w/ compliance - pt counseled on absolute need for medical compliance - may be able to titrate off some of his meds in f/u if he remains compliant   Strep throat  submandibular lymphadenopathy, and bilateral tonsillar enlargement with exudate - treated w/ PCN G benzathine 1.662mil units x1 - feeling better at time of d/c, and able to tolerate oral intake   Mild Hypokalemia Replaced      Medication List    TAKE these medications        amLODipine 10 MG tablet  Commonly known as:  NORVASC  Take 1 tablet (10 mg total) by mouth daily.     isosorbide-hydrALAZINE 20-37.5 MG tablet  Commonly known as:  BIDIL  Take 1 tablet by mouth 3 (three) times daily.     metoprolol succinate 100 MG 24 hr tablet  Commonly known as:  TOPROL-XL  Take 1 tablet (100 mg total) by mouth daily. Take with or immediately following a meal.     valsartan-hydrochlorothiazide 320-25 MG tablet  Commonly known as:  DIOVAN-HCT  Take 1 tablet by mouth daily.        Day of Discharge BP 122/91 mmHg  Pulse 66  Temp(Src) 97.9 F (36.6 C) (Oral)  Resp 18  Ht 6\' 2"  (1.88 m)  Wt 104.327 kg (230 lb)  BMI 29.52 kg/m2  SpO2 98%  Physical Exam: General: No acute respiratory distress Lungs: Clear to auscultation bilaterally without wheezes or crackles Cardiovascular: Regular rate and rhythm without murmur gallop or rub normal S1 and S2 Abdomen: Nontender, nondistended, soft, bowel sounds positive, no rebound, no ascites, no appreciable mass Extremities: No significant cyanosis, clubbing, or edema bilateral lower extremities  Basic Metabolic Panel:  Recent Labs Lab 03/17/16 0930 03/19/16 0340  NA 136 136  K 3.3* 3.5  CL 101 100*  CO2 27 24  GLUCOSE 97 100*  BUN 13 17  CREATININE 1.07 1.48*  CALCIUM 9.6 9.6  MG  --  2.2    Liver Function Tests:  Recent Labs Lab 03/17/16 0930  AST 32    ALT 22  ALKPHOS 96  BILITOT 1.9*  PROT 8.3*  ALBUMIN 4.6   CBC:  Recent Labs Lab 03/17/16 0930 03/18/16 0244  WBC 8.4 12.2*  NEUTROABS 6.6 11.0*  HGB 15.7 13.5  HCT 43.3 39.4  MCV 86.3 87.6  PLT 242 215    Cardiac Enzymes:  Recent Labs Lab 03/17/16 0930 03/17/16 2130 03/18/16 0005  TROPONINI <0.03 <0.03 <0.03   BNP (last 3 results)  Recent Labs  03/17/16 0930  BNP 15.9    Recent Results (from the past 240 hour(s))  MRSA PCR Screening     Status: None   Collection Time: 03/17/16  8:17 PM  Result Value Ref Range Status   MRSA by PCR NEGATIVE NEGATIVE Final    Comment:        The GeneXpert MRSA Assay (FDA approved for NASAL specimens only), is one component of a comprehensive MRSA colonization surveillance program. It is not intended to diagnose MRSA infection nor to guide or monitor treatment for MRSA infections.   Rapid strep screen (not at [facility])     Status: Abnormal   Collection Time: 03/17/16  9:34 PM  Result Value Ref Range Status   Streptococcus, Group A Screen (Direct) POSITIVE (A) NEGATIVE Final    Time spent in discharge (includes decision making & examination of pt): 30 minutes  03/19/2016, 9:20 AM   Lonia Blood, MD [facility] Office  [phone] Pager [phone]  On-Call/Text Page:      [email or web address]      password [facility]

## 2016-03-20 ENCOUNTER — Telehealth: Payer: Self-pay | Admitting: Family

## 2016-03-20 NOTE — Telephone Encounter (Signed)
Please contact patient to arrange a 1 week hospital follow up with me.  

## 2016-03-21 ENCOUNTER — Telehealth: Payer: Self-pay | Admitting: Behavioral Health

## 2016-03-21 NOTE — Telephone Encounter (Signed)
Attempted to reach patient for TCM/Hospital Follow-up call. Voicemail was unavailable to leave message. Will try contacting patient again on the next business day.

## 2016-03-21 NOTE — Telephone Encounter (Signed)
Pt scheduled thru mychart for 03/28/16

## 2016-03-22 NOTE — Telephone Encounter (Signed)
Attempted to reach patient again today.  Left a message for call back.

## 2016-03-23 LAB — CULTURE, BLOOD (ROUTINE X 2)
Culture: NO GROWTH
Culture: NO GROWTH

## 2016-03-28 ENCOUNTER — Ambulatory Visit: Payer: Self-pay | Admitting: Family

## 2016-03-29 ENCOUNTER — Telehealth: Payer: Self-pay | Admitting: Family

## 2016-03-29 NOTE — Telephone Encounter (Signed)
Error

## 2016-03-30 ENCOUNTER — Inpatient Hospital Stay: Payer: 59 | Admitting: Physician Assistant

## 2016-03-30 ENCOUNTER — Inpatient Hospital Stay: Payer: 59 | Admitting: Family

## 2016-04-05 ENCOUNTER — Ambulatory Visit: Payer: Self-pay | Admitting: Family

## 2016-04-25 ENCOUNTER — Encounter: Payer: Self-pay | Admitting: Family

## 2016-04-25 ENCOUNTER — Ambulatory Visit (INDEPENDENT_AMBULATORY_CARE_PROVIDER_SITE_OTHER): Payer: 59 | Admitting: Family

## 2016-04-25 VITALS — BP 142/106 | HR 76 | Temp 99.0°F | Resp 16 | Ht 73.5 in | Wt 224.2 lb

## 2016-04-25 DIAGNOSIS — J029 Acute pharyngitis, unspecified: Secondary | ICD-10-CM | POA: Diagnosis not present

## 2016-04-25 DIAGNOSIS — Z0001 Encounter for general adult medical examination with abnormal findings: Secondary | ICD-10-CM

## 2016-04-25 DIAGNOSIS — J020 Streptococcal pharyngitis: Secondary | ICD-10-CM | POA: Diagnosis not present

## 2016-04-25 DIAGNOSIS — Z0000 Encounter for general adult medical examination without abnormal findings: Secondary | ICD-10-CM | POA: Diagnosis not present

## 2016-04-25 DIAGNOSIS — I4581 Long QT syndrome: Secondary | ICD-10-CM

## 2016-04-25 DIAGNOSIS — I10 Essential (primary) hypertension: Secondary | ICD-10-CM | POA: Diagnosis not present

## 2016-04-25 DIAGNOSIS — R9431 Abnormal electrocardiogram [ECG] [EKG]: Secondary | ICD-10-CM

## 2016-04-25 LAB — POCT RAPID STREP A (OFFICE): RAPID STREP A SCREEN: POSITIVE — AB

## 2016-04-25 MED ORDER — AMOXICILLIN 500 MG PO CAPS: 500.0000 mg | ORAL_CAPSULE | Freq: Three times a day (TID) | ORAL | Status: DC

## 2016-04-25 MED ORDER — VALSARTAN-HYDROCHLOROTHIAZIDE 320-25 MG PO TABS: 1.0000 | ORAL_TABLET | Freq: Every day | ORAL | Status: DC

## 2016-04-25 MED ORDER — AMLODIPINE BESYLATE 10 MG PO TABS: 10.0000 mg | ORAL_TABLET | Freq: Every day | ORAL | Status: DC

## 2016-04-25 MED ORDER — HYDRALAZINE HCL 25 MG PO TABS: 25.0000 mg | ORAL_TABLET | Freq: Three times a day (TID) | ORAL | Status: DC

## 2016-04-25 MED ORDER — METOPROLOL SUCCINATE ER 100 MG PO TB24: 100.0000 mg | ORAL_TABLET | Freq: Every day | ORAL | Status: DC

## 2016-04-25 MED ORDER — ISOSORBIDE DINITRATE 20 MG PO TABS: 20.0000 mg | ORAL_TABLET | Freq: Three times a day (TID) | ORAL | Status: DC

## 2016-04-25 NOTE — Progress Notes (Signed)
Pre visit review using our clinic review tool, if applicable. No additional management support is needed unless otherwise documented below in the visit note. 

## 2016-04-25 NOTE — Progress Notes (Signed)
Subjective:    Patient ID: Taylor Herrera, male    DOB: 1976-01-16, 40 y.o.   MRN: [id number]  HPI  Taylor Herrera is a 40 yr old male who presents today for cpx.  Patient presents today for complete physical.  Immunizations: tetanus up to date Diet: healthy Exercise: inconsistent  HTN- pt is currently maintained on amlodipine, bidil, toprol xl and diovan-hct. He was admitted on 03/17/16 with CP/HA, hypertensive urgency. Medications were adjusted during his hospitalization. Reports bidil too expensive not taking.   BP Readings from Last 3 Encounters:  04/25/16 142/106  03/19/16 122/91  10/19/15 140/104   Prolonged QT- noted during hospitalization. (discharge summary was reviewed)  Sore throat- was treated for strep during hospitalization. Recently has had some recurrence of his throat pain.   Review of Systems  Constitutional: Negative for unexpected weight change.  HENT: Negative for rhinorrhea.   Respiratory: Negative for cough.   Cardiovascular: Negative for chest pain and leg swelling.  Gastrointestinal: Negative for diarrhea and constipation.  Genitourinary: Negative for dysuria and frequency.  Musculoskeletal: Negative for myalgias and arthralgias.  Skin: Negative for rash.  Neurological: Negative for headaches.  Hematological:       Had cervical LAD sat with sore throat but went down  Psychiatric/Behavioral:       Denies depression/anxiety   Past Medical History  Diagnosis Date  . Migraine   . Hypertension   . History of kidney stones      Social History   Social History  . Marital Status: Married    Spouse Name: N/A  . Number of Children: N/A  . Years of Education: N/A   Occupational History  . Not on file.   Social History Main Topics  . Smoking status: Never Smoker   . Smokeless tobacco: Never Used  . Alcohol Use: No     Comment: occasional  . Drug Use: No  . Sexual Activity: Not on file   Other Topics Concern  . 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 No pertinent past surgical history.  Family History  Problem Relation Age of Onset  . Cancer Mother     multiple myeloma, ovarian  . Hypertension Mother   . Hypertension Father   . Arrhythmia Father   . Kidney disease Father     No Known Allergies  Current Outpatient Prescriptions on File Prior to Visit  Medication Sig Dispense Refill  . amLODipine (NORVASC) 10 MG tablet Take 1 tablet (10 mg total) by mouth daily. 30 tablet 1  . isosorbide-hydrALAZINE (BIDIL) 20-37.5 MG tablet Take 1 tablet by mouth 3 (three) times daily. 90 tablet 0  . metoprolol succinate (TOPROL-XL) 100 MG 24 hr tablet Take 1 tablet (100 mg total) by mouth daily. Take with or immediately following a meal. 30 tablet 0  . valsartan-hydrochlorothiazide (DIOVAN-HCT) 320-25 MG tablet Take 1 tablet by mouth daily. 30 tablet 0   No current facility-administered medications on file prior to visit.    BP 142/106 mmHg  Pulse 76  Temp(Src) 99 F (37.2 C) (Oral)  Resp 16  Ht 6' 1.5" (1.867 m)  Wt 224 lb 3.2 oz (101.696 kg)  BMI 29.18 kg/m2  SpO2 100%       Objective:   Physical Exam Physical Exam  Constitutional: He is oriented to person, place, and time. He  appears well-developed and well-nourished. No distress.  HENT:  Head: Normocephalic and atraumatic.  Right Ear: Tympanic membrane and ear canal normal.  Left Ear: Tympanic membrane is erythematous and ear canal normal.  Mouth/Throat: Oropharynx is clear and moist.  Eyes: Pupils are equal, round, and reactive to light. No scleral icterus.  Neck: Normal range of motion. No thyromegaly present.  Cardiovascular: Normal rate and regular rhythm.   No murmur heard. Pulmonary/Chest: Effort normal and breath sounds normal. No respiratory distress. He has no wheezes. He has no rales. He exhibits no tenderness.    Abdominal: Soft. Bowel sounds are normal. He exhibits no distension and no mass. There is no tenderness. There is no rebound and no guarding.  Musculoskeletal: He exhibits no edema.  Lymphadenopathy:    He has no cervical adenopathy.  Neurological: He is alert and oriented to person, place, and time. He has normal reflexes. He exhibits normal muscle tone. Coordination normal.  Skin: Skin is warm and dry.  Psychiatric: He has a normal mood and affect. His behavior is normal. Judgment and thought content normal.          Assessment & Plan:          Assessment & Plan:  Prolonged QT- follow up EKG is reviewed and shows normal QT interval.  Strep throat- Rapid strep is +. treatment failure with Pen V in the hospital. Will rx with amoxicillin.   L OM- rx with amoxicillin 

## 2016-04-25 NOTE — Patient Instructions (Addendum)
Restart isosorbide and hydralazine separately each 3 times daily. Try to add 30 minutes of cardio activity 5 days a week. Please start amoxicillin for strep throat. Call if sore throat worsens or if it does not resolve in 1 week.

## 2016-04-26 ENCOUNTER — Telehealth: Payer: Self-pay | Admitting: Family

## 2016-04-26 LAB — CBC WITH DIFFERENTIAL/PLATELET
BASOS PCT: 0.6 % (ref 0.0–3.0)
Basophils Absolute: 0.1 10*3/uL (ref 0.0–0.1)
EOS ABS: 0.1 10*3/uL (ref 0.0–0.7)
Eosinophils Relative: 1.3 % (ref 0.0–5.0)
HCT: 43.7 % (ref 39.0–52.0)
Hemoglobin: 15.1 g/dL (ref 13.0–17.0)
Lymphocytes Relative: 16.3 % (ref 12.0–46.0)
Lymphs Abs: 1.6 10*3/uL (ref 0.7–4.0)
MCHC: 34.4 g/dL (ref 30.0–36.0)
MCV: 88.9 fl (ref 78.0–100.0)
MONO ABS: 0.7 10*3/uL (ref 0.1–1.0)
Monocytes Relative: 6.5 % (ref 3.0–12.0)
NEUTROS ABS: 7.6 10*3/uL (ref 1.4–7.7)
NEUTROS PCT: 75.3 % (ref 43.0–77.0)
PLATELETS: 327 10*3/uL (ref 150.0–400.0)
RBC: 4.92 Mil/uL (ref 4.22–5.81)
RDW: 13.4 % (ref 11.5–15.5)
WBC: 10.1 10*3/uL (ref 4.0–10.5)

## 2016-04-26 LAB — BASIC METABOLIC PANEL
BUN: 23 mg/dL (ref 6–23)
CHLORIDE: 100 meq/L (ref 96–112)
CO2: 28 meq/L (ref 19–32)
CREATININE: 1.21 mg/dL (ref 0.40–1.50)
Calcium: 10.1 mg/dL (ref 8.4–10.5)
GFR: 86.54 mL/min (ref >60.00)
Glucose, Bld: 85 mg/dL (ref 70–99)
Potassium: 3.4 mEq/L — ABNORMAL LOW (ref 3.5–5.1)
Sodium: 137 mEq/L (ref 135–145)

## 2016-04-26 LAB — URINALYSIS, ROUTINE W REFLEX MICROSCOPIC
BILIRUBIN URINE: NEGATIVE
Hgb urine dipstick: NEGATIVE
Ketones, ur: NEGATIVE
Leukocytes, UA: NEGATIVE
Nitrite: NEGATIVE
PH: 6.5 (ref 5.0–8.0)
RBC / HPF: NONE SEEN (ref 0–?)
Specific Gravity, Urine: 1.02 (ref 1.000–1.030)
Total Protein, Urine: NEGATIVE
UROBILINOGEN UA: 0.2 (ref 0.0–1.0)
Urine Glucose: NEGATIVE
WBC UA: NONE SEEN (ref 0–?)

## 2016-04-26 LAB — LIPID PANEL
CHOL/HDL RATIO: 2
Cholesterol: 192 mg/dL (ref 0–200)
HDL: 84.9 mg/dL (ref >39.00)
LDL CALC: 96 mg/dL (ref 0–99)
NonHDL: 106.98
TRIGLYCERIDES: 57 mg/dL (ref 0.0–149.0)
VLDL: 11.4 mg/dL (ref 0.0–40.0)

## 2016-04-26 LAB — HEPATIC FUNCTION PANEL
ALT: 32 U/L (ref 0–53)
AST: 32 U/L (ref 0–37)
Albumin: 4.5 g/dL (ref 3.5–5.2)
Alkaline Phosphatase: 102 U/L (ref 39–117)
BILIRUBIN DIRECT: 0.2 mg/dL (ref 0.0–0.3)
BILIRUBIN TOTAL: 0.9 mg/dL (ref 0.2–1.2)
Total Protein: 8 g/dL (ref 6.0–8.3)

## 2016-04-26 LAB — TSH: TSH: 0.71 u[IU]/mL (ref 0.35–4.50)

## 2016-04-26 MED ORDER — POTASSIUM CHLORIDE CRYS ER 20 MEQ PO TBCR: 20.0000 meq | EXTENDED_RELEASE_TABLET | Freq: Every day | ORAL | Status: DC

## 2016-04-26 NOTE — Telephone Encounter (Signed)
Potassium is low. Start kdur once daily. Repeat bmet in 1 week, dx hypokalemia. Other labs look good.

## 2016-04-26 NOTE — Assessment & Plan Note (Signed)
Discussed healthy diet, exercise. Obtain routine lab work.   

## 2016-04-26 NOTE — Assessment & Plan Note (Signed)
BP still up but not taking Bidil due to cost. Will split med into components for cost purposes. Restart these meds, follow up in 1 month. Will also order renal artery duplex to rule out RAS.

## 2016-04-27 ENCOUNTER — Other Ambulatory Visit: Payer: Self-pay | Admitting: Family

## 2016-04-27 DIAGNOSIS — E876 Hypokalemia: Secondary | ICD-10-CM

## 2016-04-27 NOTE — Telephone Encounter (Signed)
Notified pt and he voices understanding. Lab appt scheduled for 05/06/16 and order entered.

## 2016-05-06 ENCOUNTER — Other Ambulatory Visit (INDEPENDENT_AMBULATORY_CARE_PROVIDER_SITE_OTHER): Payer: 59

## 2016-05-06 DIAGNOSIS — E876 Hypokalemia: Secondary | ICD-10-CM | POA: Diagnosis not present

## 2016-05-06 LAB — BASIC METABOLIC PANEL
BUN: 24 mg/dL — ABNORMAL HIGH (ref 6–23)
CALCIUM: 9.3 mg/dL (ref 8.4–10.5)
CHLORIDE: 106 meq/L (ref 96–112)
CO2: 25 meq/L (ref 19–32)
Creatinine, Ser: 1.45 mg/dL (ref 0.40–1.50)
GFR: 70.22 mL/min (ref >60.00)
Glucose, Bld: 95 mg/dL (ref 70–99)
Potassium: 3.9 mEq/L (ref 3.5–5.1)
SODIUM: 140 meq/L (ref 135–145)

## 2016-05-08 ENCOUNTER — Encounter: Payer: Self-pay | Admitting: Family

## 2016-05-27 ENCOUNTER — Ambulatory Visit (INDEPENDENT_AMBULATORY_CARE_PROVIDER_SITE_OTHER): Payer: 59 | Admitting: Family

## 2016-05-27 ENCOUNTER — Ambulatory Visit (HOSPITAL_COMMUNITY)
Admission: RE | Admit: 2016-05-27 | Discharge: 2016-05-27 | Disposition: A | Discharge status: Home / Self Care | Payer: 59 | Source: Ambulatory Visit | Attending: Family | Admitting: Family

## 2016-05-27 ENCOUNTER — Encounter: Payer: Self-pay | Admitting: Family

## 2016-05-27 VITALS — BP 130/90 | HR 78 | Temp 98.4°F | Resp 16 | Ht 73.5 in | Wt 232.8 lb

## 2016-05-27 DIAGNOSIS — I10 Essential (primary) hypertension: Secondary | ICD-10-CM | POA: Diagnosis present

## 2016-05-27 NOTE — Progress Notes (Signed)
Preliminary results by tech - Renal Duplex Completed. No evidence of renal artery stenosis bilaterally.  Marilynne Halstedita Jaima Janney, BS, RDMS, RVT

## 2016-05-27 NOTE — Progress Notes (Signed)
Subjective:    Patient ID: Taylor Herrera, male    DOB: 10/22/1976, 40 y.o.   MRN: [id number]  HPI   HTN-  Last visit he reported inability to afford bidil.  Medication was split into it's components.  Pt reports that the medication is now affordable for him.  Reports good compliance with medication. Denies CP/SOB or swelling. Does note some mild dizziness sometimes.  BP Readings from Last 3 Encounters:  05/27/16 130/90  04/25/16 142/106  03/19/16 122/91     Review of Systems See HPI  Past Medical History  Diagnosis Date  . Migraine   . Hypertension   . History of kidney stones      Social History   Social History  . Marital Status: Married    Spouse Name: N/A  . Number of Children: N/A  . Years of Education: N/A   Occupational History  . Not on file.   Social History Main Topics  . Smoking status: Never Smoker   . Smokeless tobacco: Never Used  . Alcohol Use: No     Comment: occasional  . Drug Use: No  . Sexual Activity: Not on file   Other Topics Concern  . 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  Family History  Problem Relation Age of Onset  . Cancer Mother     multiple myeloma, ovarian  . Hypertension Mother   . Hypertension Father   . Arrhythmia Father   . Kidney disease Father     ESRD on HD    No Known Allergies  Current Outpatient Prescriptions on File Prior to Visit  Medication Sig Dispense Refill  . amLODipine (NORVASC) 10 MG tablet Take 1 tablet (10 mg total) by mouth daily. 30 tablet 5  . hydrALAZINE (APRESOLINE) 25 MG tablet Take 1 tablet (25 mg total) by mouth 3 (three) times daily. 90 tablet 2  . isosorbide dinitrate (ISORDIL) 20 MG tablet Take 1 tablet (20 mg total) by mouth 3 (three) times daily. 90 tablet 3  . metoprolol succinate  (TOPROL-XL) 100 MG 24 hr tablet Take 1 tablet (100 mg total) by mouth daily. Take with or immediately following a meal. 30 tablet 5  . potassium chloride SA (K-DUR,KLOR-CON) 20 MEQ tablet Take 1 tablet (20 mEq total) by mouth daily. 30 tablet 3  . valsartan-hydrochlorothiazide (DIOVAN-HCT) 320-25 MG tablet Take 1 tablet by mouth daily. 30 tablet 5   No current facility-administered medications on file prior to visit.    BP 130/90 mmHg  Pulse 78  Temp(Src) 98.4 F (36.9 C) (Oral)  Resp 16  Ht 6' 1.5" (1.867 m)  Wt 232 lb 12.8 oz (105.597 kg)  BMI 30.29 kg/m2  SpO2 98%       Objective:   Physical Exam  Constitutional: He is oriented to person, place, and time. He appears well-developed and well-nourished. No distress.  HENT:  Head: Normocephalic and atraumatic.  Cardiovascular: Normal rate and regular rhythm.   No murmur heard. Pulmonary/Chest: Effort normal and breath sounds normal. No respiratory distress. He has no wheezes. He has no rales.  Neurological: He is alert and oriented to person, place, and time.  Skin: Skin is warm and dry.  Psychiatric: He has a normal mood and affect. His behavior  is normal. Thought content normal.          Assessment & Plan:

## 2016-05-27 NOTE — Progress Notes (Signed)
Pre visit review using our clinic review tool, if applicable. No additional management support is needed unless otherwise documented below in the visit note. 

## 2016-05-27 NOTE — Assessment & Plan Note (Signed)
Improved. Continue current meds. Follow up in 3 months.

## 2016-05-27 NOTE — Patient Instructions (Signed)
Continue current medications. 

## 2016-05-28 ENCOUNTER — Encounter: Payer: Self-pay | Admitting: Family

## 2016-08-26 ENCOUNTER — Telehealth: Payer: Self-pay | Admitting: Family

## 2016-08-26 ENCOUNTER — Ambulatory Visit: Payer: 59 | Admitting: Family

## 2016-08-26 NOTE — Telephone Encounter (Signed)
No charge per [facility]

## 2016-08-26 NOTE — Telephone Encounter (Signed)
Patient left message on voicemail @ 6:47 A.M. Stating that he would not be at his 7:45 appointment. Stated he will call back later to reschedule. Charge or No Charge?

## 2016-08-26 NOTE — Telephone Encounter (Signed)
No charge. 

## 2016-12-14 ENCOUNTER — Other Ambulatory Visit: Payer: Self-pay | Admitting: Family

## 2016-12-14 NOTE — Telephone Encounter (Signed)
Caller name: Relationship to patient: Self Can be reached: [phone]  Pharmacy:  [id number] - [place], [place] - [address] [phone] (Phone) [phone] (Fax)     Reason for call: Request refill on valsartan-hydrochlorothiazide (DIOVAN-HCT) 320-25 MG tablet [[id number]

## 2016-12-15 ENCOUNTER — Other Ambulatory Visit: Payer: Self-pay | Admitting: Family

## 2016-12-15 IMAGING — DX DG CHEST 2V
2 series · 2 of 2 positions shown · non-contrast
Comparison: None available

CLINICAL DATA: Pt states that since this afternoon he has had
central chest pain that radiates to the left side and up his neck
and down arm with nausea and diaphoreses. Hx of htn.

EXAM:
CHEST - 2 VIEW

[chest pa]
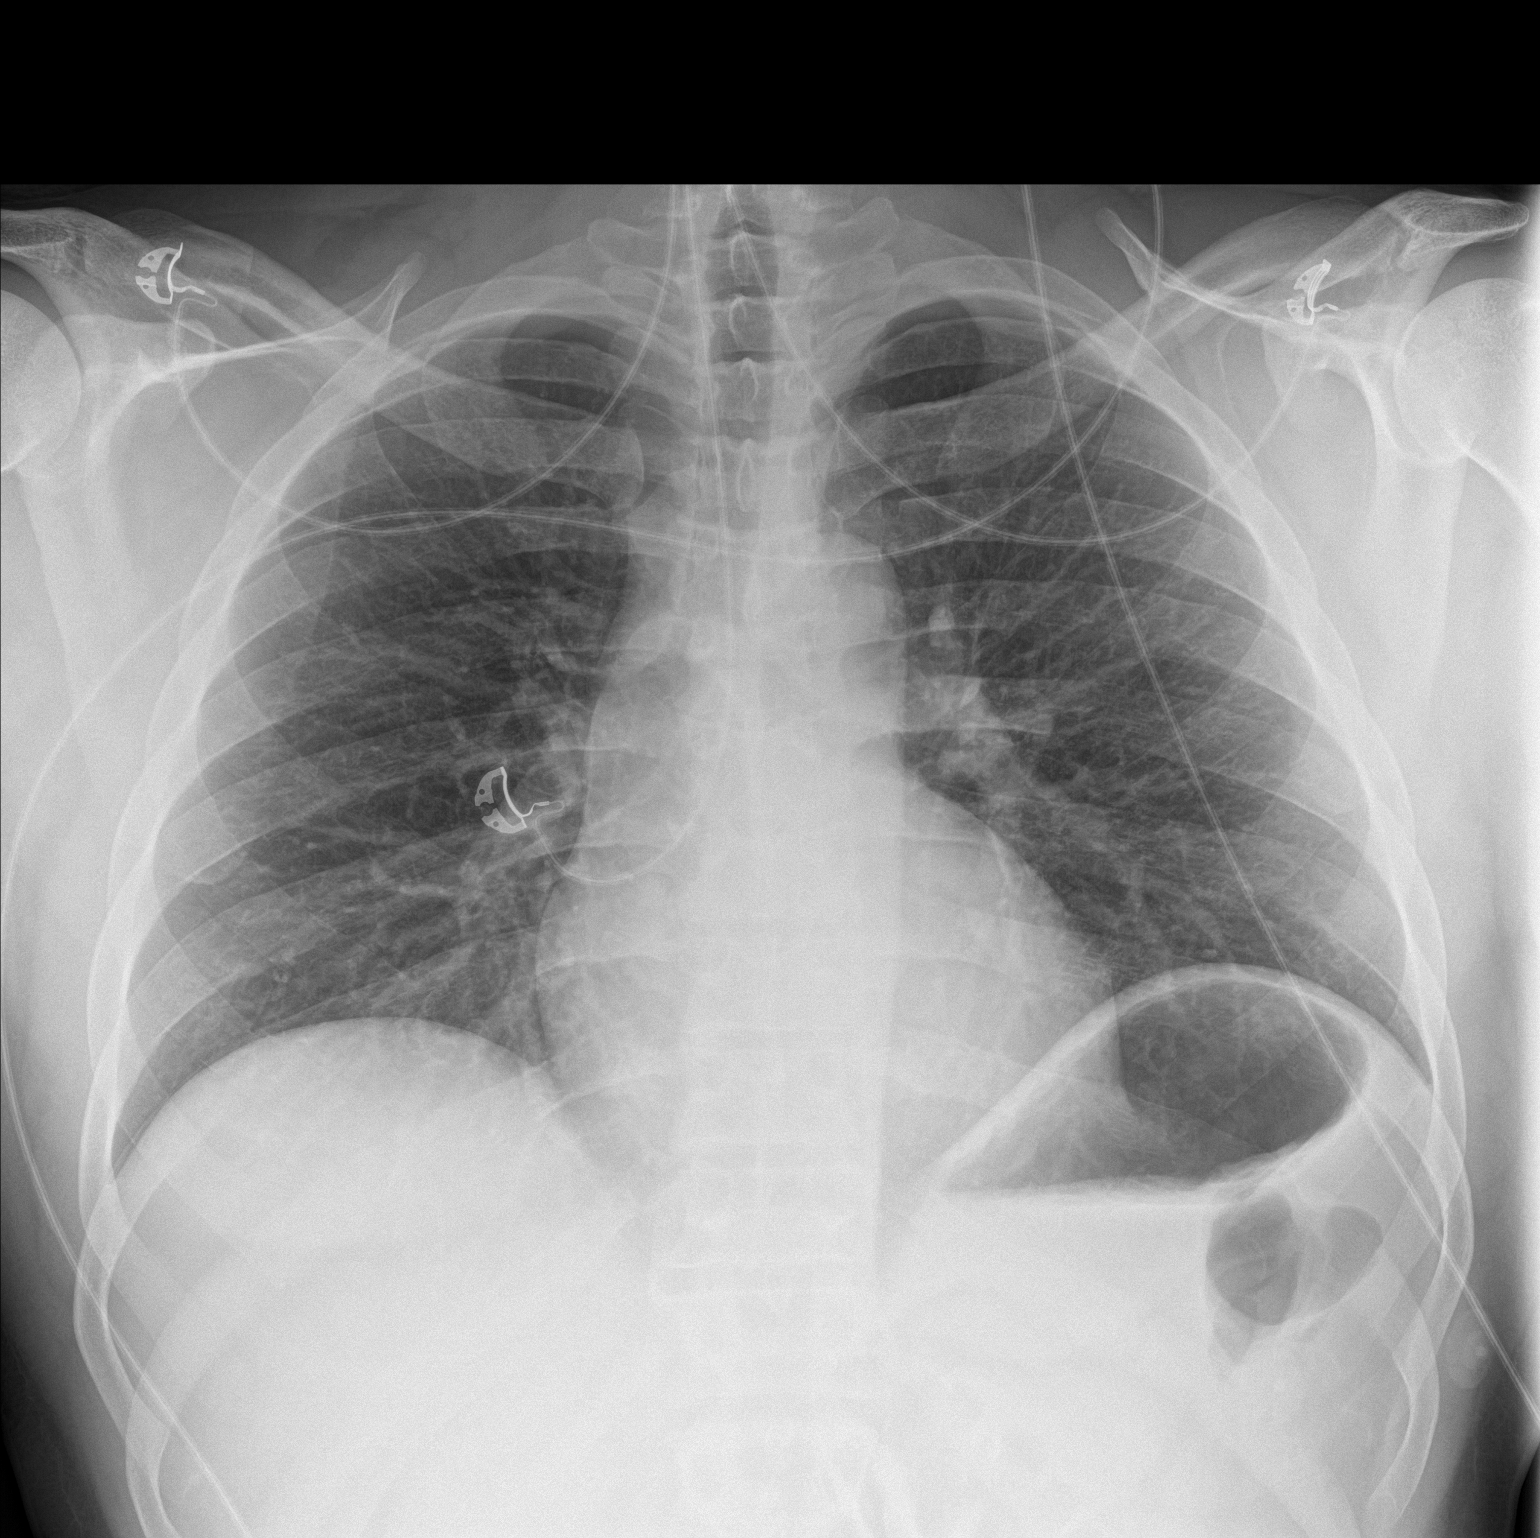

[chest lat]
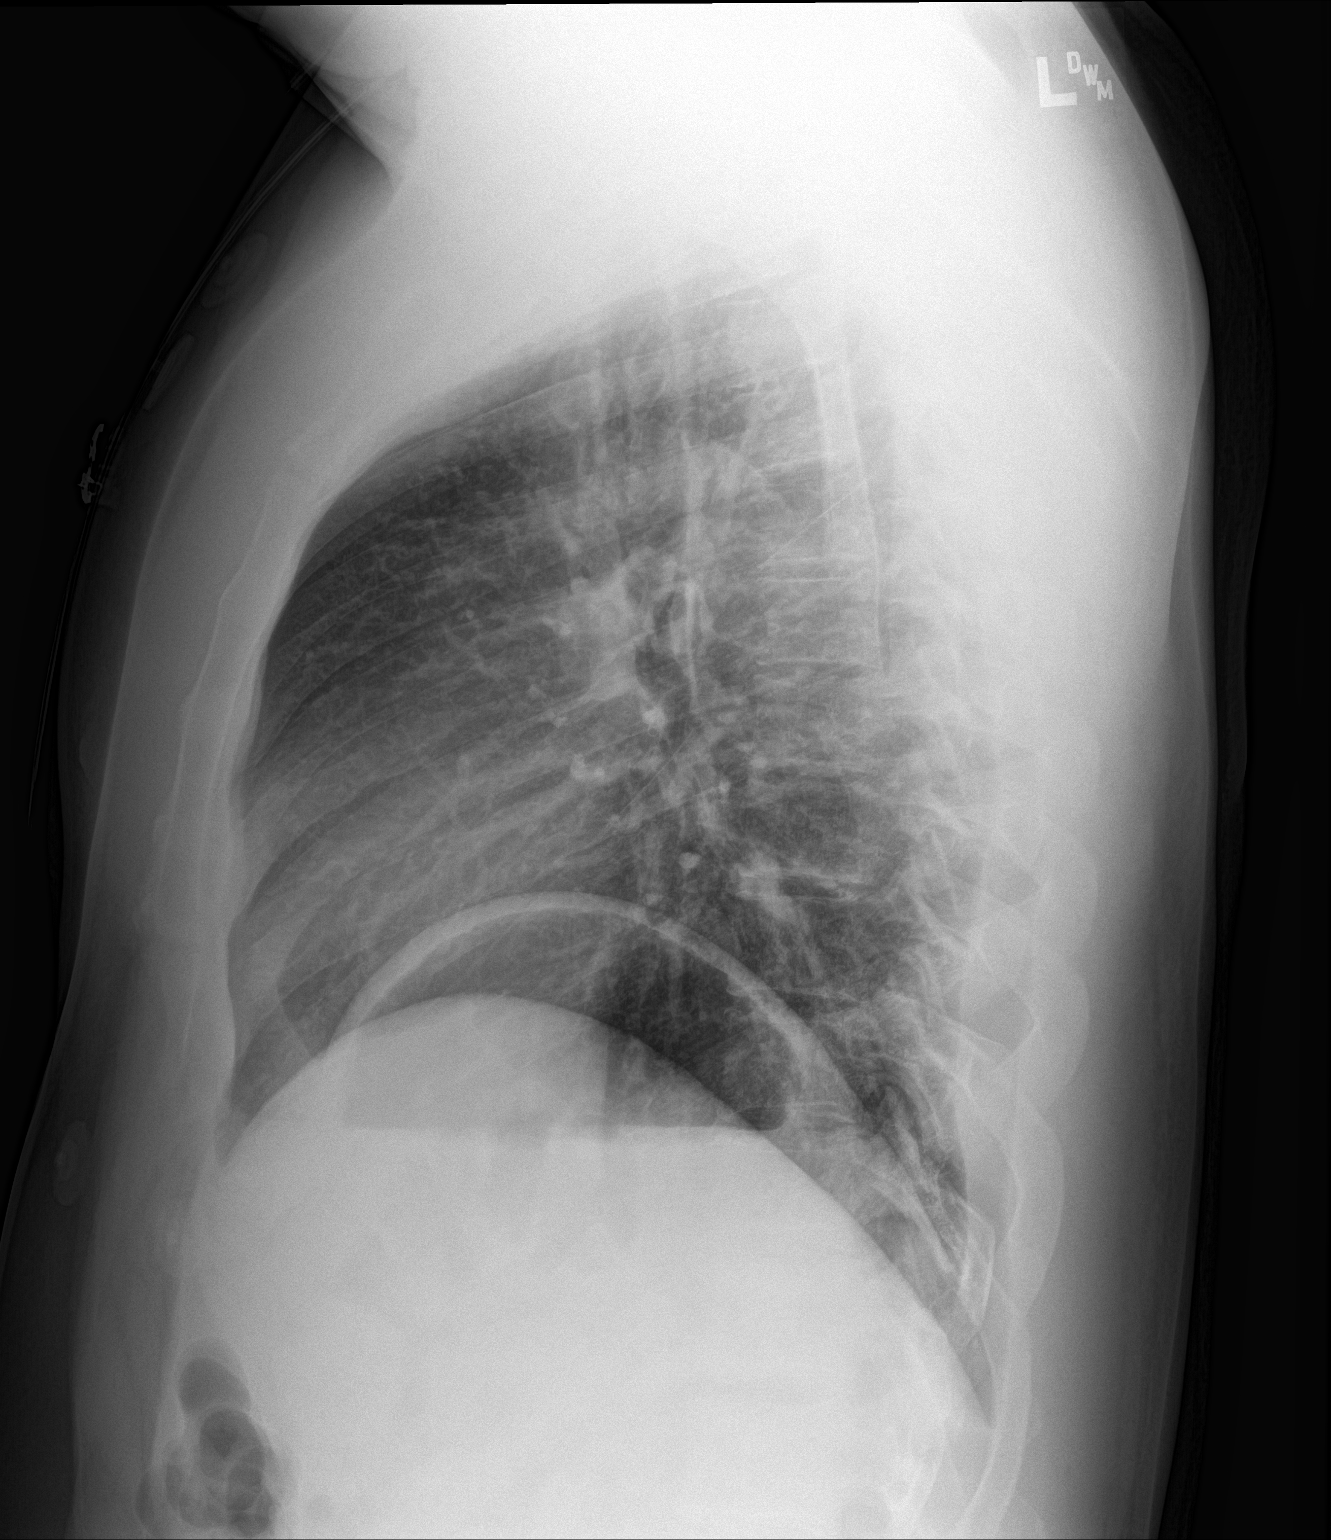

[2 of 2 positions shown; findings below may reference images not displayed]

FINDINGS: Relatively low lung volumes. Lungs are clear. Heart size and
mediastinal contours are within normal limits.
No effusion.  No pneumothorax.
Visualized skeletal structures are unremarkable. Mild gaseous
distention of the stomach.
IMPRESSION: No acute cardiopulmonary disease.

## 2016-12-15 MED ORDER — VALSARTAN-HYDROCHLOROTHIAZIDE 320-25 MG PO TABS: 1.0000 | ORAL_TABLET | Freq: Every day | ORAL | 0 refills | Status: DC

## 2016-12-15 NOTE — Telephone Encounter (Signed)
Last filled:  04/25/16 Amt: 30,5 Last OV:  05/27/16 Next appt: 12/19/16  Rx sent to pharmacy.

## 2016-12-16 MED ORDER — VALSARTAN-HYDROCHLOROTHIAZIDE 320-25 MG PO TABS: 1.0000 | ORAL_TABLET | Freq: Every day | ORAL | 0 refills | Status: DC

## 2016-12-19 ENCOUNTER — Ambulatory Visit: Payer: Self-pay | Admitting: Family

## 2016-12-26 ENCOUNTER — Ambulatory Visit (INDEPENDENT_AMBULATORY_CARE_PROVIDER_SITE_OTHER): Payer: BLUE CROSS/BLUE SHIELD | Admitting: Family

## 2016-12-26 ENCOUNTER — Encounter: Payer: Self-pay | Admitting: Family

## 2016-12-26 VITALS — BP 208/140 | HR 84 | Temp 98.8°F | Resp 16 | Ht 73.5 in | Wt 237.0 lb

## 2016-12-26 DIAGNOSIS — I160 Hypertensive urgency: Secondary | ICD-10-CM | POA: Diagnosis not present

## 2016-12-26 DIAGNOSIS — I159 Secondary hypertension, unspecified: Secondary | ICD-10-CM

## 2016-12-26 DIAGNOSIS — I10 Essential (primary) hypertension: Secondary | ICD-10-CM

## 2016-12-26 NOTE — Progress Notes (Signed)
Subjective:    Patient ID: Taylor Herrera, male    DOB: 11/13/1976, 41 y.o.   MRN: [id number]  HPI  Mr.  Taylor Herrera is a 41 yr old male who presents today for follow up of his hypertension.  He is maintained on hydralazine 67m TID, imdur, amlodipine 135mand Toprol XL 10036mand diovan HCT.  Reports good compliance with his medications. Denies missed doses. Denies CP/SOB or swelling.   BP Readings from Last 3 Encounters:  12/26/16 (!) 208/140  05/27/16 130/90  04/25/16 (!) 142/106     Review of Systems See HPI  Past Medical History:  Diagnosis Date  . History of kidney stones   . Hypertension   . Migraine      Social History   Social History  . Marital status: Married    Spouse name: N/A  . Number of children: N/A  . Years of education: N/A   Occupational History  . Not on file.   Social History Main Topics  . Smoking status: Never Smoker  . Smokeless tobacco: Never Used  . Alcohol use No     Comment: occasional  . Drug use: No  . Sexual activity: Not on file   Other Topics Concern  . 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  Family History  Problem Relation Age of Onset  . Cancer Mother     multiple myeloma, ovarian  . Hypertension Mother   . Hypertension Father   . Arrhythmia Father   . Kidney disease Father     ESRD on HD    No Known Allergies  Current Outpatient Prescriptions on File Prior to Visit  Medication Sig Dispense Refill  . amLODipine (NORVASC) 10 MG tablet Take 1 tablet (10 mg total) by mouth daily. 30 tablet 5  . hydrALAZINE (APRESOLINE) 25 MG tablet Take 1 tablet (25 mg total) by mouth 3 (three) times daily. 90 tablet 2  . isosorbide dinitrate (ISORDIL) 20 MG tablet Take 1 tablet (20 mg total) by mouth 3 (three) times daily. 90 tablet 3  .  metoprolol succinate (TOPROL-XL) 100 MG 24 hr tablet Take 1 tablet (100 mg total) by mouth daily. Take with or immediately following a meal. 30 tablet 5  . potassium chloride SA (K-DUR,KLOR-CON) 20 MEQ tablet Take 1 tablet (20 mEq total) by mouth daily. 30 tablet 3  . valsartan-hydrochlorothiazide (DIOVAN-HCT) 320-25 MG tablet Take 1 tablet by mouth daily. 30 tablet 0   No current facility-administered medications on file prior to visit.     BP (!) 208/140 (BP Location: Right Arm, Cuff Size: Large)   Pulse 84   Temp 98.8 F (37.1 C) (Oral)   Resp 16   Ht 6' 1.5" (1.867 m)   Wt 237 lb (107.5 kg)   SpO2 100% Comment: room air  BMI 30.84 kg/m       Objective:   Physical Exam  Constitutional: He is oriented to person, place, and time. He appears well-developed and well-nourished. No distress.  HENT:  Head: Normocephalic and atraumatic.  Cardiovascular: Normal rate and regular rhythm.   No murmur heard. Pulmonary/Chest: Effort normal and breath sounds normal. No respiratory distress. He has no wheezes. He has no rales.  Musculoskeletal: He exhibits no edema.  Neurological:  He is alert and oriented to person, place, and time.  Skin: Skin is warm and dry.  Psychiatric: He has a normal mood and affect. His behavior is normal. Thought content normal.          Assessment & Plan:  Hypertensive Urgency- Repeat  Blood pressure manually is 213/145. Advised pt that he needs evaluation in the ED.  He is agreeable to go. I offered to walk the patient down but he declines "I'm OK."  Report given to the ER physician on duty.

## 2016-12-26 NOTE — Progress Notes (Signed)
Pre visit review using our clinic review tool, if applicable. No additional management support is needed unless otherwise documented below in the visit note. 

## 2016-12-26 NOTE — Patient Instructions (Signed)
Please proceed to the ER on the first floor for further evaluation of your blood pressure.

## 2016-12-27 ENCOUNTER — Telehealth: Payer: Self-pay | Admitting: Family

## 2016-12-27 NOTE — Telephone Encounter (Signed)
Called patient to follow-up. He states is planning to go to [facility]. He feels this is a [facility] in case he gets admitted. He states he is "doing fine" and did not go to ED yesterday as advised because he had "things to care of." Reiterated to patient the importance of evaluation today, and prolonged elevated BP represents significant risk. Patient verbalized understanding of instructions.

## 2016-12-27 NOTE — Telephone Encounter (Signed)
Could you please contact patient to follow up with him this AM. He was advised to go to the ER last night for hypertensive urgency however my records show he did not go as advised.  I would recommend that he come into the office for bp recheck today please.

## 2016-12-27 NOTE — Telephone Encounter (Signed)
Noted  

## 2017-01-11 ENCOUNTER — Telehealth: Payer: Self-pay | Admitting: Family

## 2017-01-11 NOTE — Telephone Encounter (Signed)
Metoprolol denial sent to pharmacy per last office note pt was suppose to be evaluated in ED for uncontrolled  Blood pressure and did not go. Please advise

## 2017-01-12 ENCOUNTER — Other Ambulatory Visit: Payer: Self-pay | Admitting: Family

## 2017-01-12 MED ORDER — METOPROLOL SUCCINATE ER 100 MG PO TB24: 100.0000 mg | ORAL_TABLET | Freq: Every day | ORAL | 0 refills | Status: DC

## 2017-01-12 NOTE — Telephone Encounter (Signed)
OK to send 30 day supply.  Please advise pt to follow up in the office this week for BP recheck.

## 2017-01-16 NOTE — Telephone Encounter (Signed)
Called pt. lvm advising pt to call our office as soon as possible to schedule a BP recheck with her PCP.

## 2017-01-20 ENCOUNTER — Ambulatory Visit: Payer: Self-pay | Admitting: Family

## 2017-01-23 ENCOUNTER — Ambulatory Visit: Payer: Self-pay | Admitting: Family

## 2017-01-24 ENCOUNTER — Encounter: Payer: Self-pay | Admitting: Family

## 2017-01-24 ENCOUNTER — Ambulatory Visit (INDEPENDENT_AMBULATORY_CARE_PROVIDER_SITE_OTHER): Payer: BLUE CROSS/BLUE SHIELD | Admitting: Family

## 2017-01-24 VITALS — BP 155/110 | HR 69 | Temp 98.3°F | Resp 16 | Ht 74.0 in | Wt 244.0 lb

## 2017-01-24 DIAGNOSIS — I10 Essential (primary) hypertension: Secondary | ICD-10-CM | POA: Diagnosis not present

## 2017-01-24 LAB — BASIC METABOLIC PANEL
BUN: 18 mg/dL (ref 6–23)
CALCIUM: 9.3 mg/dL (ref 8.4–10.5)
CO2: 29 mEq/L (ref 19–32)
CREATININE: 1.35 mg/dL (ref 0.40–1.50)
Chloride: 105 mEq/L (ref 96–112)
GFR: 75.96 mL/min (ref >60.00)
Glucose, Bld: 96 mg/dL (ref 70–99)
Potassium: 3.7 mEq/L (ref 3.5–5.1)
Sodium: 141 mEq/L (ref 135–145)

## 2017-01-24 MED ORDER — METOPROLOL SUCCINATE ER 100 MG PO TB24: 100.0000 mg | ORAL_TABLET | Freq: Every day | ORAL | 5 refills | Status: DC

## 2017-01-24 MED ORDER — HYDRALAZINE HCL 50 MG PO TABS: 50.0000 mg | ORAL_TABLET | Freq: Three times a day (TID) | ORAL | 5 refills | Status: DC

## 2017-01-24 MED ORDER — POTASSIUM CHLORIDE CRYS ER 20 MEQ PO TBCR: 20.0000 meq | EXTENDED_RELEASE_TABLET | Freq: Every day | ORAL | 5 refills | Status: DC

## 2017-01-24 MED ORDER — VALSARTAN-HYDROCHLOROTHIAZIDE 320-25 MG PO TABS: 1.0000 | ORAL_TABLET | Freq: Every day | ORAL | 5 refills | Status: DC

## 2017-01-24 NOTE — Assessment & Plan Note (Addendum)
bp looks better today but still above goal. Will increase hydralazine from 25mg  tid to 50mg  tid. Obtain follow up bmet.

## 2017-01-24 NOTE — Patient Instructions (Signed)
Increase hydralazine from 25mg  to 50mg .

## 2017-01-24 NOTE — Progress Notes (Signed)
Subjective:    Patient ID: Taylor Herrera, male    DOB: [birth year], 41 y.o.   MRN: [id number]  HPI  Taylor Herrera is a 41 yr old male who presents today for follow up of his poorly controlled HTN.  Last visit his blood pressure was noted to be extremely high and he was instructed to got to the ED. He did not go to the ED as instructed.  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 He denies CP/SOB or swelling.   BP Readings from Last 3 Encounters:  01/24/17 (!) 155/110  12/26/16 (!) 208/140  05/27/16 130/90    Review of Systems    see HPI  Past Medical History:  Diagnosis Date  . History of kidney stones   . Hypertension   . Migraine      Social History   Social History  . Marital status: Married    Spouse name: N/A  . Number of children: N/A  . Years of education: N/A   Occupational History  . Not on file.   Social History Main Topics  . Smoking status: Never Smoker  . Smokeless tobacco: Never Used  . Alcohol use No     Comment: occasional  . Drug use: No  . Sexual activity: Not on file   Other Topics Concern  . 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  Family History  Problem Relation Age of Onset  . Cancer Mother     multiple myeloma, ovarian  . Hypertension Mother   . Hypertension Father   . Arrhythmia Father   . Kidney disease Father     ESRD on HD    No Known Allergies  Current Outpatient Prescriptions on File Prior to Visit  Medication Sig Dispense Refill  . amLODipine (NORVASC) 10 MG tablet Take 1 tablet (10 mg total) by mouth daily. 30 tablet 5  . hydrALAZINE (APRESOLINE) 25 MG tablet Take 1 tablet (25 mg total) by mouth 3 (three) times daily. 90 tablet 2  . isosorbide dinitrate (ISORDIL) 20 MG  tablet Take 1 tablet (20 mg total) by mouth 3 (three) times daily. 90 tablet 3  . metoprolol succinate (TOPROL-XL) 100 MG 24 hr tablet Take 1 tablet (100 mg total) by mouth daily. Take with or immediately following a meal. 30 tablet 0  . potassium chloride SA (K-DUR,KLOR-CON) 20 MEQ tablet Take 1 tablet (20 mEq total) by mouth daily. 30 tablet 3  . valsartan-hydrochlorothiazide (DIOVAN-HCT) 320-25 MG tablet Take 1 tablet by mouth daily. 30 tablet 0   No current facility-administered medications on file prior to visit.     BP (!) 155/110 (BP Location: Right Arm, Cuff Size: Large)   Pulse 69   Temp 98.3 F (36.8 C) (Oral)   Resp 16   Ht _0  (1.88 m)   Wt 244 lb (110.7 kg)   SpO2 98% Comment: room air  BMI 31.33 kg/m    Objective:   Physical Exam  Constitutional: He is oriented to person, place, and time. He appears well-developed and well-nourished. No distress.  HENT:  Head: Normocephalic and atraumatic.  Cardiovascular: Normal rate and regular rhythm.   No murmur  heard. Pulmonary/Chest: Effort normal and breath sounds normal. No respiratory distress. He has no wheezes. He has no rales.  Musculoskeletal: He exhibits no edema.  Neurological: He is alert and oriented to person, place, and time.  Skin: Skin is warm and dry.  Psychiatric: He has a normal mood and affect. His behavior is normal. Thought content normal.          Assessment & Plan:

## 2017-01-24 NOTE — Progress Notes (Signed)
Pre visit review using our clinic review tool, if applicable. No additional management support is needed unless otherwise documented below in the visit note. 

## 2017-01-29 ENCOUNTER — Encounter: Payer: Self-pay | Admitting: Family

## 2017-02-22 ENCOUNTER — Ambulatory Visit: Payer: BLUE CROSS/BLUE SHIELD | Admitting: Family

## 2017-02-27 ENCOUNTER — Ambulatory Visit (INDEPENDENT_AMBULATORY_CARE_PROVIDER_SITE_OTHER): Payer: BLUE CROSS/BLUE SHIELD | Admitting: Family

## 2017-02-27 ENCOUNTER — Encounter: Payer: Self-pay | Admitting: Family

## 2017-02-27 DIAGNOSIS — I10 Essential (primary) hypertension: Secondary | ICD-10-CM | POA: Diagnosis not present

## 2017-02-27 MED ORDER — AMLODIPINE BESYLATE 10 MG PO TABS: 10.0000 mg | ORAL_TABLET | Freq: Every day | ORAL | 5 refills | Status: DC

## 2017-02-27 MED ORDER — ISOSORBIDE DINITRATE 20 MG PO TABS: 20.0000 mg | ORAL_TABLET | Freq: Three times a day (TID) | ORAL | 5 refills | Status: DC

## 2017-02-27 NOTE — Patient Instructions (Signed)
Continue current medications.    Keep up the good work!!!

## 2017-02-27 NOTE — Assessment & Plan Note (Signed)
Stable on current regimen. Continue same. 

## 2017-02-27 NOTE — Progress Notes (Signed)
Pre visit review using our clinic review tool, if applicable. No additional management support is needed unless otherwise documented below in the visit note. 

## 2017-02-27 NOTE — Progress Notes (Signed)
 Subjective:    Patient ID: Taylor Herrera, male    DOB: 03/07/1976, 41 y.o.   MRN: [id number]  HPI   Taylor Herrera is a 41 yr old male who presents today for follow up his hypertension. Last visit his hydralazine was increased from 25mg tid to 50mg tid. Denies CP/SOB/swelling.   BP Readings from Last 3 Encounters:  02/27/17 138/77  01/24/17 (!) 155/110  12/26/16 (!) 208/140      Review of Systems See HPI  Past Medical History:  Diagnosis Date  . History of kidney stones   . Hypertension   . Migraine      Social History   Social History  . Marital status: Married    Spouse name: N/A  . Number of children: N/A  . Years of education: N/A   Occupational History  . Not on file.   Social History Main Topics  . Smoking status: Never Smoker  . Smokeless tobacco: Never Used  . Alcohol use No     Comment: occasional  . Drug use: No  . Sexual activity: Not on file   Other Topics Concern  . 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  Family History  Problem Relation Age of Onset  . Cancer Mother     multiple myeloma, ovarian  . Hypertension Mother   . Hypertension Father   . Arrhythmia Father   . Kidney disease Father     ESRD on HD    No Known Allergies  Current Outpatient Prescriptions on File Prior to Visit  Medication Sig Dispense Refill  . amLODipine (NORVASC) 10 MG tablet Take 1 tablet (10 mg total) by mouth daily. 30 tablet 5  . hydrALAZINE (APRESOLINE) 50 MG tablet Take 1 tablet (50 mg total) by mouth 3 (three) times daily. 90 tablet 5  . isosorbide dinitrate (ISORDIL) 20 MG tablet Take 1 tablet (20 mg total) by mouth 3 (three) times daily. 90 tablet 3  . metoprolol succinate (TOPROL-XL) 100 MG 24 hr tablet Take 1 tablet (100 mg total) by mouth daily. Take with or  immediately following a meal. 30 tablet 5  . potassium chloride SA (K-DUR,KLOR-CON) 20 MEQ tablet Take 1 tablet (20 mEq total) by mouth daily. 30 tablet 5  . valsartan-hydrochlorothiazide (DIOVAN-HCT) 320-25 MG tablet Take 1 tablet by mouth daily. 30 tablet 5   No current facility-administered medications on file prior to visit.     BP 138/77 (BP Location: Right Arm, Cuff Size: Large)   Pulse 82   Temp 98.6 F (37 C) (Oral)   Resp 16   Ht 6' 2" (1.88 m)   Wt 246 lb 12.8 oz (111.9 kg)   SpO2 100% Comment: room air  BMI 31.69 kg/m       Objective:   Physical Exam  Constitutional: He is oriented to person, place, and time. He appears well-developed and well-nourished. No distress.  HENT:  Head: Normocephalic and atraumatic.  Cardiovascular: Normal rate and regular rhythm.   No murmur heard. Pulmonary/Chest: Effort normal and breath sounds normal. No respiratory distress. He has no wheezes. He has no rales.  Musculoskeletal: He exhibits no edema.  Neurological: He is alert and oriented to person, place, and time.  Skin: Skin is warm and dry.    Psychiatric: He has a normal mood and affect. His behavior is normal. Thought content normal.          Assessment & Plan:   

## 2017-05-28 IMAGING — CR DG CHEST 2V
2 series · 2 of 2 positions shown · non-contrast
Comparison: 10/05/2015

CLINICAL DATA: Acute shortness of breath, chest discomfort and
dizziness for 1 week.

EXAM:
CHEST  2 VIEW

[w chest pa]
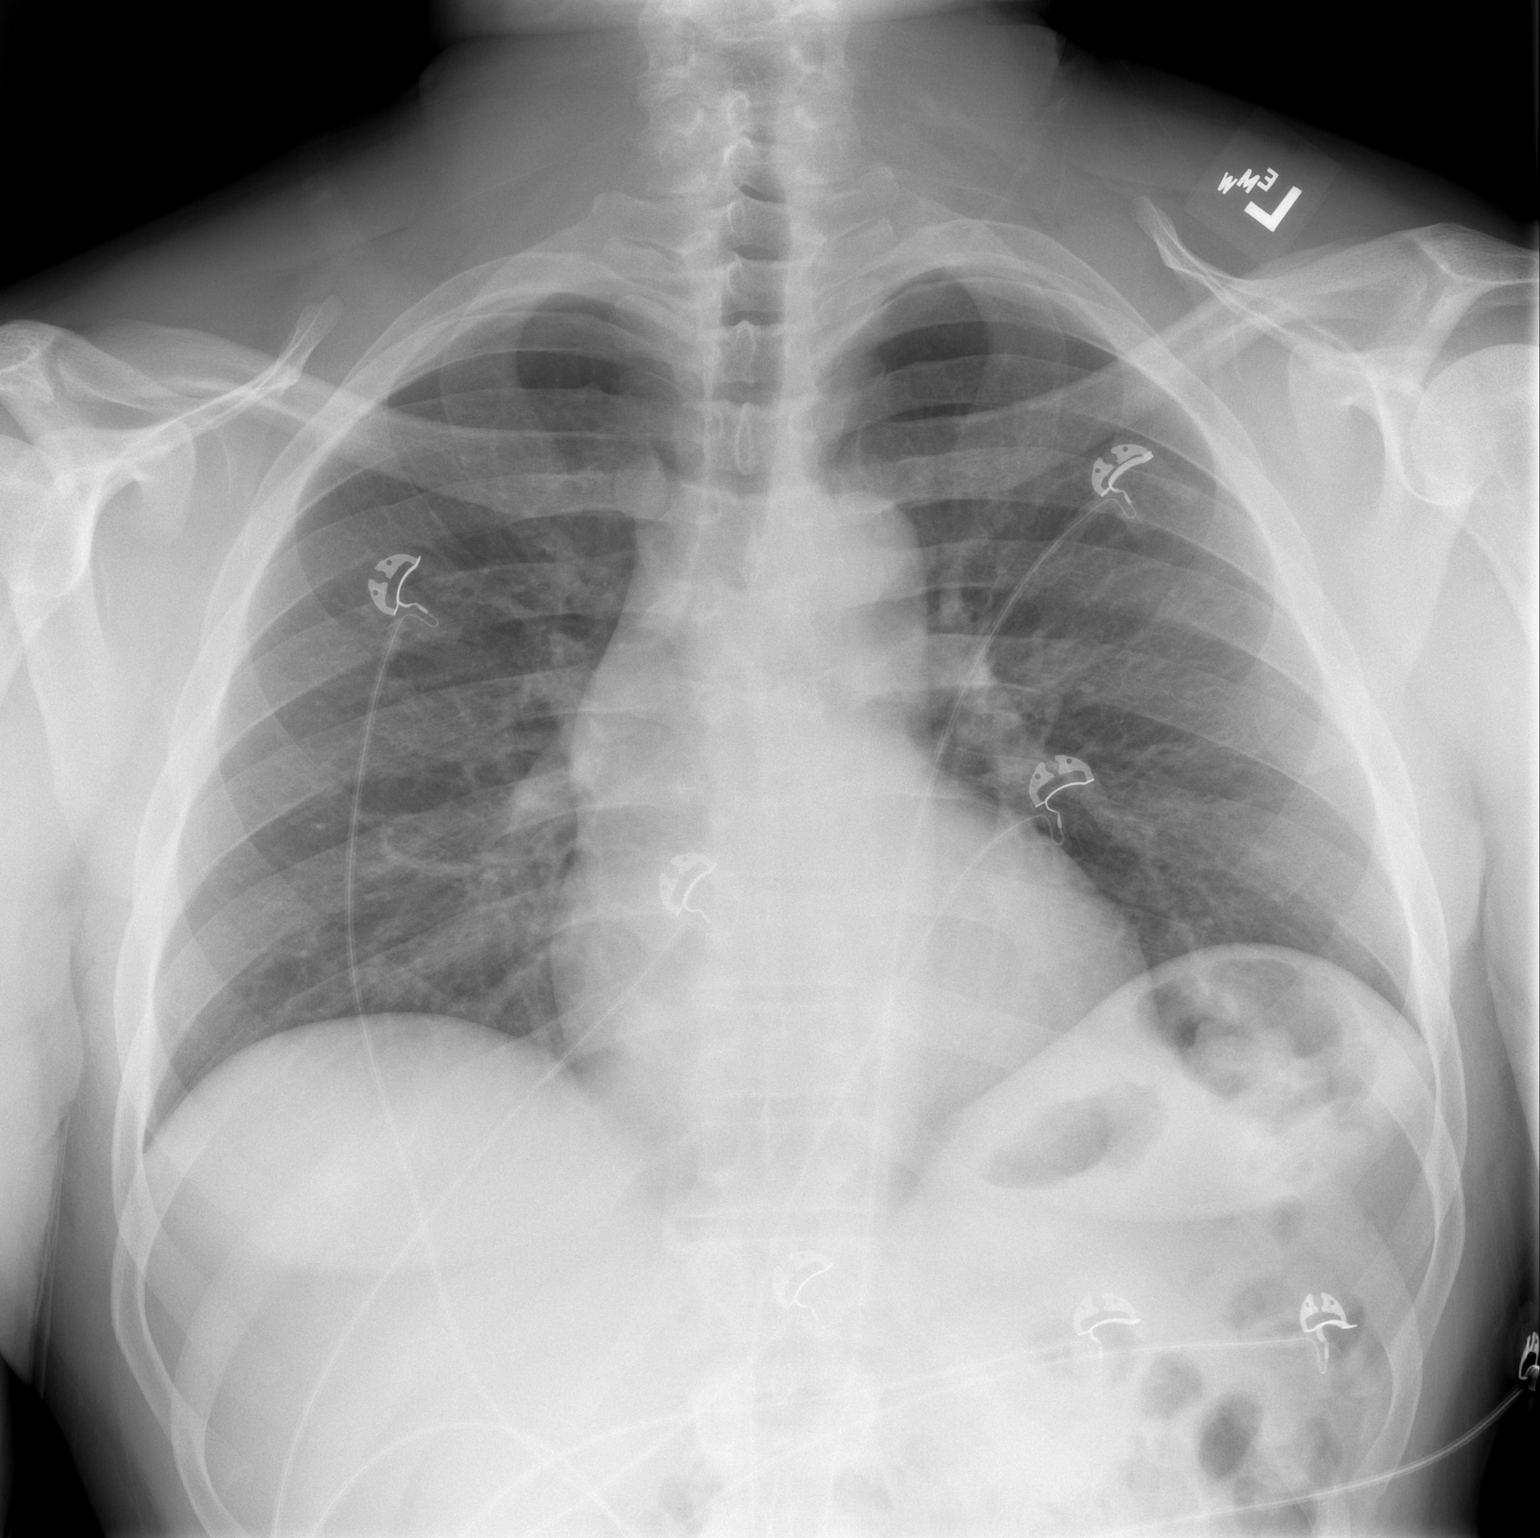

[w chest lat]
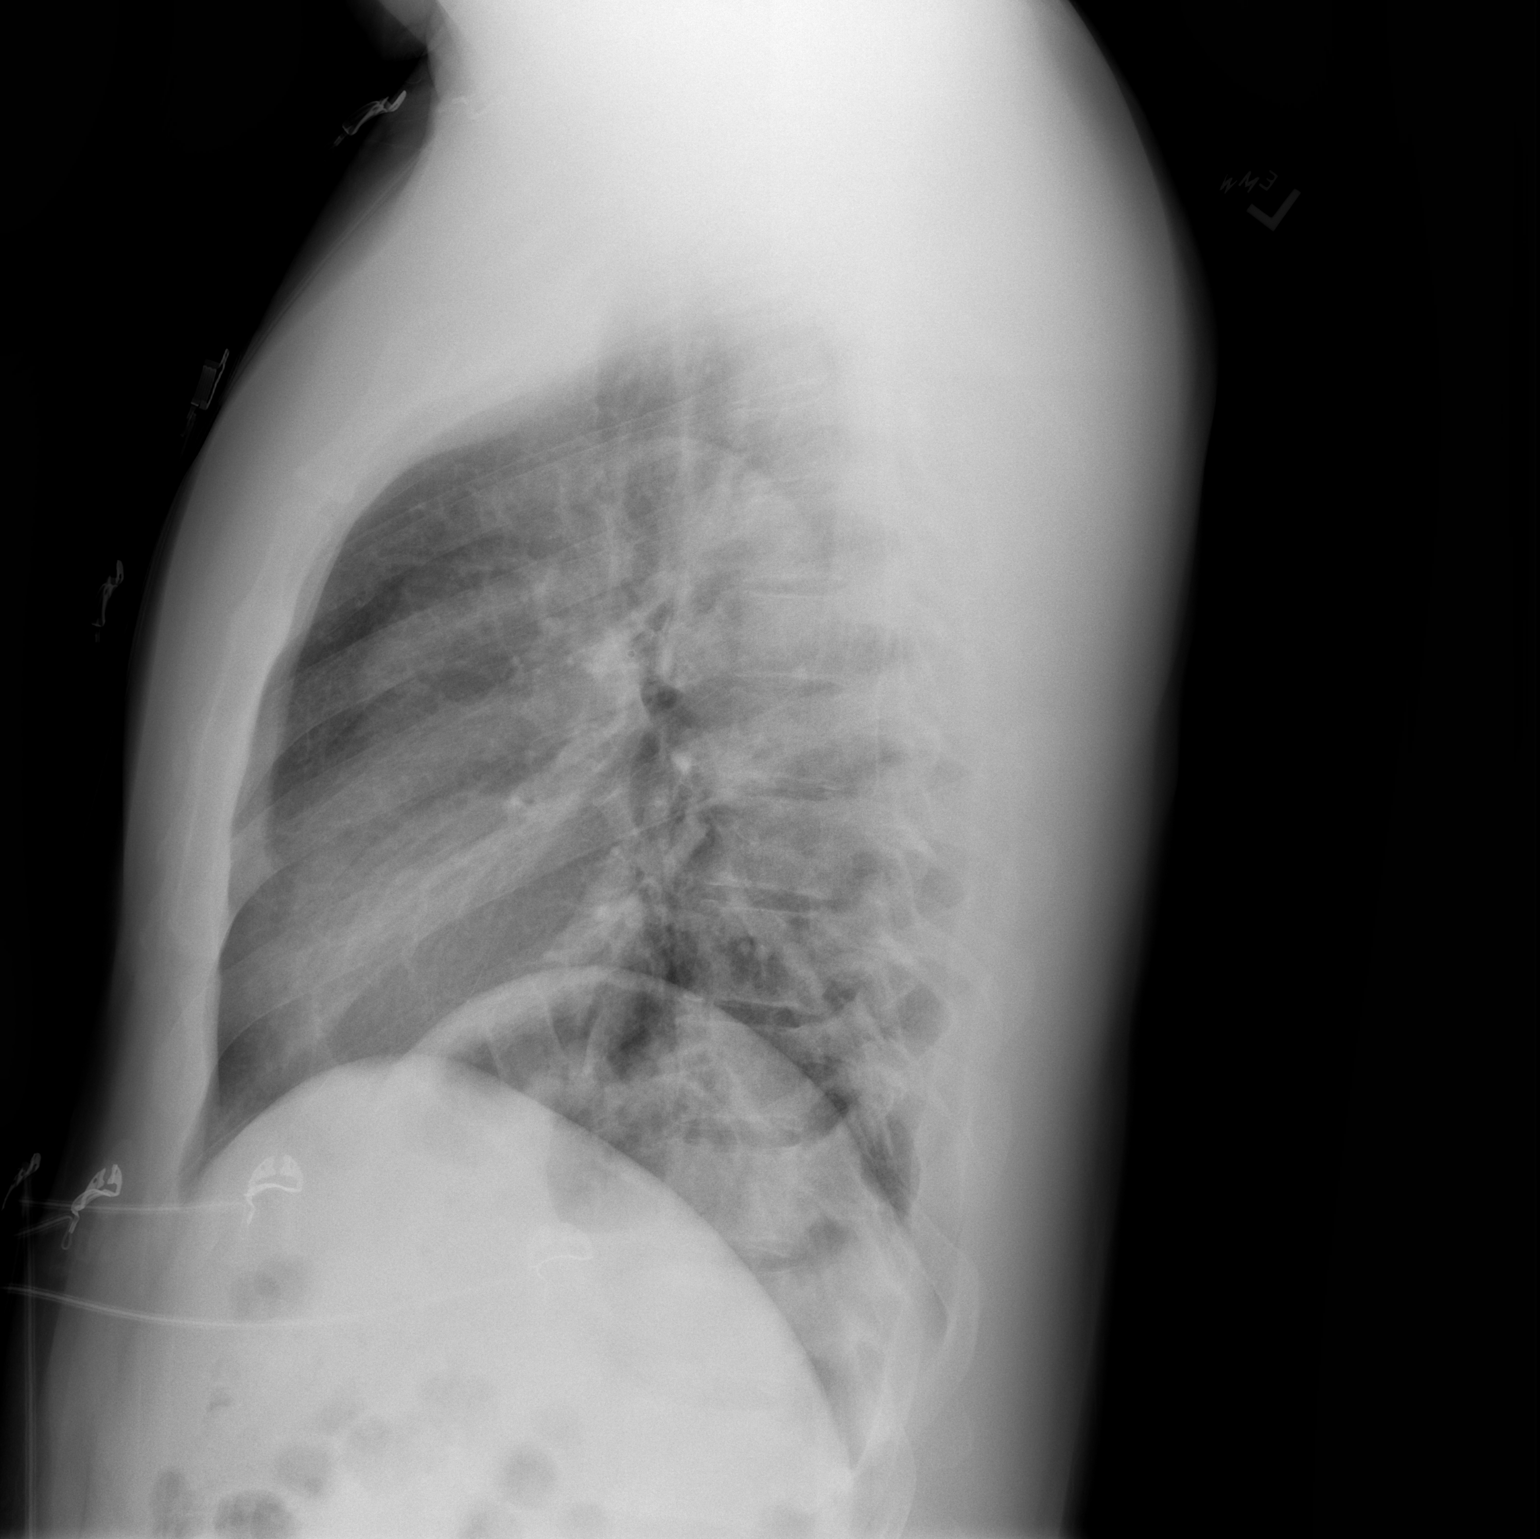

[2 of 2 positions shown; findings below may reference images not displayed]

FINDINGS: The heart size and mediastinal contours are within normal limits.
Both lungs are clear. The visualized skeletal structures are
unremarkable.
IMPRESSION: No active cardiopulmonary disease.

## 2017-06-07 ENCOUNTER — Encounter: Payer: BLUE CROSS/BLUE SHIELD | Admitting: Family

## 2017-06-14 ENCOUNTER — Encounter: Payer: Self-pay | Admitting: Family

## 2017-06-14 ENCOUNTER — Ambulatory Visit (INDEPENDENT_AMBULATORY_CARE_PROVIDER_SITE_OTHER): Payer: BLUE CROSS/BLUE SHIELD | Admitting: Family

## 2017-06-14 VITALS — BP 131/77 | HR 95 | Temp 98.4°F | Ht 74.0 in | Wt 237.0 lb

## 2017-06-14 DIAGNOSIS — Z0000 Encounter for general adult medical examination without abnormal findings: Secondary | ICD-10-CM

## 2017-06-14 DIAGNOSIS — M2550 Pain in unspecified joint: Secondary | ICD-10-CM

## 2017-06-14 DIAGNOSIS — Z0001 Encounter for general adult medical examination with abnormal findings: Secondary | ICD-10-CM

## 2017-06-14 LAB — URINALYSIS, ROUTINE W REFLEX MICROSCOPIC
BILIRUBIN URINE: NEGATIVE
HGB URINE DIPSTICK: NEGATIVE
KETONES UR: NEGATIVE
Leukocytes, UA: NEGATIVE
NITRITE: NEGATIVE
PH: 6 (ref 5.0–8.0)
RBC / HPF: NONE SEEN (ref 0–?)
Specific Gravity, Urine: 1.015 (ref 1.000–1.030)
Total Protein, Urine: NEGATIVE
UROBILINOGEN UA: 0.2 (ref 0.0–1.0)
Urine Glucose: NEGATIVE
WBC UA: NONE SEEN (ref 0–?)

## 2017-06-14 LAB — BASIC METABOLIC PANEL
BUN: 24 mg/dL — ABNORMAL HIGH (ref 6–23)
CHLORIDE: 101 meq/L (ref 96–112)
CO2: 26 meq/L (ref 19–32)
CREATININE: 1.4 mg/dL (ref 0.40–1.50)
Calcium: 10.2 mg/dL (ref 8.4–10.5)
GFR: 72.69 mL/min (ref >60.00)
Glucose, Bld: 102 mg/dL — ABNORMAL HIGH (ref 70–99)
POTASSIUM: 3.7 meq/L (ref 3.5–5.1)
Sodium: 134 mEq/L — ABNORMAL LOW (ref 135–145)

## 2017-06-14 LAB — LIPID PANEL
CHOL/HDL RATIO: 3
CHOLESTEROL: 148 mg/dL (ref 0–200)
HDL: 47.4 mg/dL (ref >39.00)
LDL Cholesterol: 80 mg/dL (ref 0–99)
NonHDL: 101.08
TRIGLYCERIDES: 104 mg/dL (ref 0.0–149.0)
VLDL: 20.8 mg/dL (ref 0.0–40.0)

## 2017-06-14 LAB — CBC WITH DIFFERENTIAL/PLATELET
BASOS PCT: 0.5 % (ref 0.0–3.0)
Basophils Absolute: 0 10*3/uL (ref 0.0–0.1)
EOS ABS: 0.1 10*3/uL (ref 0.0–0.7)
EOS PCT: 1.6 % (ref 0.0–5.0)
HEMATOCRIT: 42.9 % (ref 39.0–52.0)
Hemoglobin: 14.5 g/dL (ref 13.0–17.0)
Lymphocytes Relative: 16.3 % (ref 12.0–46.0)
Lymphs Abs: 1.5 10*3/uL (ref 0.7–4.0)
MCHC: 33.8 g/dL (ref 30.0–36.0)
MCV: 89.7 fl (ref 78.0–100.0)
MONO ABS: 0.8 10*3/uL (ref 0.1–1.0)
Monocytes Relative: 8.6 % (ref 3.0–12.0)
NEUTROS ABS: 6.5 10*3/uL (ref 1.4–7.7)
Neutrophils Relative %: 73 % (ref 43.0–77.0)
PLATELETS: 317 10*3/uL (ref 150.0–400.0)
RBC: 4.78 Mil/uL (ref 4.22–5.81)
RDW: 14 % (ref 11.5–15.5)
WBC: 8.9 10*3/uL (ref 4.0–10.5)

## 2017-06-14 LAB — HEPATIC FUNCTION PANEL
ALT: 23 U/L (ref 0–53)
AST: 20 U/L (ref 0–37)
Albumin: 4.4 g/dL (ref 3.5–5.2)
Alkaline Phosphatase: 91 U/L (ref 39–117)
BILIRUBIN DIRECT: 0.2 mg/dL (ref 0.0–0.3)
BILIRUBIN TOTAL: 1.1 mg/dL (ref 0.2–1.2)
TOTAL PROTEIN: 7.9 g/dL (ref 6.0–8.3)

## 2017-06-14 LAB — TSH: TSH: 0.56 u[IU]/mL (ref 0.35–4.50)

## 2017-06-14 NOTE — Patient Instructions (Signed)
Continue healthy diet and regular exercise.  Complete lab work prior to leaving.  

## 2017-06-14 NOTE — Progress Notes (Signed)
Subjective:    Patient ID: Taylor Herrera, male    DOB: [birth year], 41 y.o.   MRN: [id number]  HPI  Patient presents today for complete physical.  Immunizations:tetanus Diet:reports healthy diet Exercise: 5-6 times a week, goes to the gym, walking the dog daily.  Vision exam:  Has scheduled Dental:  Scheduled  Weight down some due to healthy diet and increased exercise.  Wt Readings from Last 3 Encounters:  06/14/17 237 lb (107.5 kg)  02/27/17 246 lb 12.8 oz (111.9 kg)  01/24/17 244 lb (110.7 kg)      Review of Systems  Constitutional: Negative for unexpected weight change.  HENT: Negative for hearing loss and rhinorrhea.   Eyes: Negative for visual disturbance.  Respiratory: Negative for cough and shortness of breath.   Cardiovascular: Negative for chest pain and leg swelling.  Gastrointestinal: Negative for blood in stool, constipation and diarrhea.  Genitourinary: Negative for dysuria, frequency and hematuria.  Musculoskeletal:       Occasional right wrist pain- aches intermittently Left knee pain intermittently  Skin: Negative for rash.  Neurological: Negative for headaches.  Hematological: Negative for adenopathy.  Psychiatric/Behavioral:       Denies depression/anxiety       Past Medical History:  Diagnosis Date  . History of kidney stones   . Hypertension   . Migraine      Social History   Social History  . Marital status: Married    Spouse name: N/A  . Number of children: N/A  . Years of education: N/A   Occupational History  . Not on file.   Social History Main Topics  . Smoking status: Never Smoker  . Smokeless tobacco: Never Used  . Alcohol use No     Comment: occasional  . Drug use: No  . Sexual activity: Not on file   Other Topics Concern  . 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  Family History  Problem Relation Age of Onset  . Cancer Mother        multiple myeloma, ovarian  . Hypertension Mother   . Hypertension Father   . Arrhythmia Father   . Kidney disease Father        ESRD on HD    No Known Allergies  Current Outpatient Prescriptions on File Prior to Visit  Medication Sig Dispense Refill  . amLODipine (NORVASC) 10 MG tablet Take 1 tablet (10 mg total) by mouth daily. 30 tablet 5  . hydrALAZINE (APRESOLINE) 50 MG tablet Take 1 tablet (50 mg total) by mouth 3 (three) times daily. 90 tablet 5  . isosorbide dinitrate (ISORDIL) 20 MG tablet Take 1 tablet (20 mg total) by mouth 3 (three) times daily. 90 tablet 5  . metoprolol succinate (TOPROL-XL) 100 MG 24 hr tablet Take 1 tablet (100 mg total) by mouth daily. Take with or immediately following a meal. 30 tablet 5  . potassium chloride SA (K-DUR,KLOR-CON) 20 MEQ tablet Take 1 tablet (20 mEq total) by mouth daily. 30 tablet 5  . valsartan-hydrochlorothiazide (DIOVAN-HCT) 320-25 MG tablet Take 1 tablet by mouth daily. 30 tablet 5   No current facility-administered medications on file prior to visit.     BP 131/77   Pulse 95   Temp  98.4 F (36.9 C) (Oral)   Ht 6' 2" (1.88 m)   Wt 237 lb (107.5 kg)   SpO2 99%   BMI 30.43 kg/m    Objective:   Physical Exam  Physical Exam  Constitutional: He is oriented to person, place, and time. He appears well-developed and well-nourished. No distress.  HENT:  Head: Normocephalic and atraumatic.  Right Ear: Tympanic membrane and ear canal normal.  Left Ear: Tympanic membrane and ear canal normal.  Mouth/Throat: Oropharynx is clear and moist.  Eyes: Pupils are equal, round, and reactive to light. No scleral icterus.  Neck: Normal range of motion. No thyromegaly present.  Cardiovascular: Normal rate and regular rhythm.   No murmur heard. Pulmonary/Chest: Effort normal and breath sounds normal. No respiratory distress. He  has no wheezes. He has no rales. He exhibits no tenderness.  Abdominal: Soft. Bowel sounds are normal. He exhibits no distension and no mass. There is no tenderness. There is no rebound and no guarding.  Musculoskeletal: He exhibits no edema.  Lymphadenopathy:    He has no cervical adenopathy.  Neurological: He is alert and oriented to person, place, and time. He has normal patellar reflexes. He exhibits normal muscle tone. Coordination normal.  Skin: Skin is warm and dry.  Psychiatric: He has a normal mood and affect. His behavior is normal. Judgment and thought content normal.           Assessment & Plan:   Preventative care- Encourage patient to continue healthy diet and regular exercise. We will obtain routine lab work. Immunizations are reviewed reviewed and are up-to-date. He is advised to follow-up in 6 months so we can follow-up on his blood pressure.  Arthralgia- patient has a family history of rheumatoid arthritis. Will check a rheumatoid factor.      Assessment & Plan:         Assessment & Plan:

## 2017-06-15 LAB — RHEUMATOID FACTOR

## 2017-12-15 ENCOUNTER — Ambulatory Visit: Payer: BLUE CROSS/BLUE SHIELD | Admitting: Family

## 2018-03-12 ENCOUNTER — Ambulatory Visit: Payer: Self-pay | Admitting: Family

## 2018-07-03 ENCOUNTER — Encounter: Payer: Self-pay | Admitting: Family

## 2018-07-18 ENCOUNTER — Encounter: Payer: Self-pay | Admitting: Family

## 2018-07-27 ENCOUNTER — Encounter: Payer: Self-pay | Admitting: Family

## 2018-08-01 ENCOUNTER — Encounter: Payer: Self-pay | Admitting: Family

## 2018-08-01 ENCOUNTER — Ambulatory Visit (INDEPENDENT_AMBULATORY_CARE_PROVIDER_SITE_OTHER): Payer: PRIVATE HEALTH INSURANCE | Admitting: Family

## 2018-08-01 VITALS — BP 200/138 | HR 85 | Temp 98.6°F | Resp 18 | Ht 73.0 in | Wt 236.2 lb

## 2018-08-01 DIAGNOSIS — F1011 Alcohol abuse, in remission: Secondary | ICD-10-CM | POA: Insufficient documentation

## 2018-08-01 DIAGNOSIS — I10 Essential (primary) hypertension: Secondary | ICD-10-CM | POA: Diagnosis not present

## 2018-08-01 DIAGNOSIS — Z87898 Personal history of other specified conditions: Secondary | ICD-10-CM

## 2018-08-01 DIAGNOSIS — F1090 Alcohol use, unspecified, uncomplicated: Secondary | ICD-10-CM | POA: Insufficient documentation

## 2018-08-01 HISTORY — DX: Alcohol abuse, in remission: F10.11

## 2018-08-01 LAB — BASIC METABOLIC PANEL
BUN: 26 mg/dL — ABNORMAL HIGH (ref 6–23)
CALCIUM: 9.5 mg/dL (ref 8.4–10.5)
CO2: 27 meq/L (ref 19–32)
Chloride: 101 mEq/L (ref 96–112)
Creatinine, Ser: 1.79 mg/dL — ABNORMAL HIGH (ref 0.40–1.50)
GFR: 54.42 mL/min — AB (ref >60.00)
Glucose, Bld: 99 mg/dL (ref 70–99)
Potassium: 3.5 mEq/L (ref 3.5–5.1)
SODIUM: 136 meq/L (ref 135–145)

## 2018-08-01 MED ORDER — METOPROLOL TARTRATE 25 MG/10 ML ORAL SUSPENSION
50.0000 mg | Freq: Once | ORAL | Status: AC
  Administered 2018-08-01: 50 mg via ORAL

## 2018-08-01 MED ORDER — POTASSIUM CHLORIDE CRYS ER 20 MEQ PO TBCR: 20.0000 meq | EXTENDED_RELEASE_TABLET | Freq: Every day | ORAL | 5 refills | Status: DC

## 2018-08-01 MED ORDER — CLONIDINE HCL 0.1 MG PO TABS
0.1000 mg | ORAL_TABLET | Freq: Once | ORAL | Status: AC
  Administered 2018-08-01: 0.1 mg via ORAL

## 2018-08-01 MED ORDER — OLMESARTAN-AMLODIPINE-HCTZ 20-5-12.5 MG PO TABS: 1.0000 | ORAL_TABLET | Freq: Every day | ORAL | 1 refills | Status: DC

## 2018-08-01 NOTE — Patient Instructions (Addendum)
Please complete lab work prior to leaving.  Please begin [place]enz[place] this evening.  Check your blood pressure in the AM. Send me your reading via mychart.   Go to the ER or call 911 if you develop changes in vision, numbness, weakness, slurred speech, shortness of breath or chest pain.

## 2018-08-01 NOTE — Progress Notes (Signed)
Subjective:    Patient ID: Taylor Herrera, male    DOB: 16-Oct-1976, 42 y.o.   MRN: [id number]  HPI  Patient is a 42 yr old male who presents today for follow up of his blood pressure. He admits to non-compliance with his blood pressure medications for the last 2 months.  Today he admits that he has not been forthcoming about his alcohol use.  He reports that following his divorce his alcohol use became greater.  He reports that he was drinking up to 1/5 a day of rum.  During that time he was not focused on self-care.  He reports that his daughter is a Equities trader in high school and is come to the realization that he is getting too old to not take care of himself.  He has discontinued his alcohol use.  He reports that he is now motivated to work on his health.  He reports that when he was up [place] he was maintained on Tribenzor.  He felt better on this medication than the regimen that he was on most recently.  He wishes to restart Tribenzor.  BP Readings from Last 3 Encounters:  08/01/18 (!) 210/138  06/14/17 131/77  02/27/17 138/77   He declines a flu shot today. Review of Systems See HPI  Past Medical History:  Diagnosis Date  . History of kidney stones   . Hypertension   . Migraine      Social History   Socioeconomic History  . Marital status: Married    Spouse name: Not on file  . Number of children: Not on file  . Years of education: Not on file  . Highest education level: Not on file  Occupational History  . Not on file  Social Needs  . Financial resource strain: Not on file  . Food insecurity:    Worry: Not on file    Inability: Not on file  . Transportation needs:    Medical: Not on file    Non-medical: Not on file  Tobacco Use  . Smoking status: Never Smoker  . Smokeless tobacco: Never Used  Substance and Sexual Activity  . Alcohol use: No    Comment: occasional  . Drug use: No  . Sexual activity: Not on file  Lifestyle  . Physical activity:    Days per  week: Not on file    Minutes per session: Not on file  . Stress: Not on file  Relationships  . Social connections:    Talks on phone: Not on file    Gets together: Not on file    Attends religious service: Not on file    Active member of club or organization: Not on file    Attends meetings of clubs or organizations: Not on file    Relationship status: Not on file  . Intimate partner violence:    Fear of current or ex partner: Not on file    Emotionally abused: Not on file    Physically abused: Not on file    Forced sexual activity: Not on file  Other Topics Concern  . 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 No pertinent surgical history.  Family History  Problem Relation Age of Onset  . Cancer Mother        multiple  myeloma, ovarian  . Hypertension Mother   . Hypertension Father   . Arrhythmia Father   . Kidney disease Father        ESRD on HD    No Known Allergies  No current outpatient medications on file prior to visit.   No current facility-administered medications on file prior to visit.     BP (!) 220/140   Pulse 85   Temp 98.6 F (37 C) (Oral)   Resp 18   Ht 6' 1"  (1.854 m)   Wt 236 lb 3.2 oz (107.1 kg)   SpO2 100%   BMI 31.16 kg/m       Objective:   Physical Exam  Constitutional: He is oriented to person, place, and time. He appears well-developed and well-nourished. No distress.  HENT:  Head: Normocephalic and atraumatic.  Cardiovascular: Normal rate and regular rhythm.  No murmur heard. Pulmonary/Chest: Effort normal and breath sounds normal. No respiratory distress. He has no wheezes. He has no rales.  Musculoskeletal: He exhibits no edema.  Neurological: He is alert and oriented to person, place, and time.  Skin: Skin is warm and dry.  Psychiatric: He has a  normal mood and affect. His behavior is normal. Thought content normal.          Assessment & Plan:  Uncontrolled HTN- reinforced the importance of compliance with BP meds. We discussed risks of end organ damage with uncontrolled HTN.  Over the course of 2 hours pt was given 0.78m of clonidine and 531mof metoprolol. BP came down from 220/140 to 200/138. We discussed having him go to the ER but he prefers to work on improving his med compliance.  Unfortunately, I suspect his BP has been running high at this level for months. I have advised the patient as follows:    Please complete lab work prior to leaving.  Please begin Tribenzor this evening.  Check your blood pressure in the AM. Send me your reading via mychart.   Go to the ER or call 911 if you develop changes in vision, numbness, weakness, slurred speech, shortness of breath or chest pain.  Hx of Alcohol abuse- commended pt on cutting down on his alcohol use and being motivated to work on his health.     Follow up in 1 week.

## 2018-08-02 ENCOUNTER — Telehealth: Payer: Self-pay | Admitting: Family

## 2018-08-02 NOTE — Telephone Encounter (Signed)
Please contact pt and let him know that his kidneys are showing signs of damage.  How is his BP today?

## 2018-08-06 ENCOUNTER — Encounter: Payer: Self-pay | Admitting: Family

## 2018-08-06 NOTE — Telephone Encounter (Signed)
Notified pt. He states he is going to buy a BP monitor today and will call us back with his reading. [facility] triage to discuss when pt returns call.

## 2018-08-08 ENCOUNTER — Encounter: Payer: Self-pay | Admitting: Family

## 2018-08-08 ENCOUNTER — Ambulatory Visit: Payer: PRIVATE HEALTH INSURANCE | Admitting: Family

## 2018-08-08 VITALS — BP 162/120 | HR 91 | Temp 98.0°F | Resp 16 | Ht 73.0 in | Wt 233.4 lb

## 2018-08-08 DIAGNOSIS — I10 Essential (primary) hypertension: Secondary | ICD-10-CM | POA: Diagnosis not present

## 2018-08-08 LAB — BASIC METABOLIC PANEL
BUN: 29 mg/dL — ABNORMAL HIGH (ref 6–23)
CALCIUM: 10.3 mg/dL (ref 8.4–10.5)
CO2: 29 mEq/L (ref 19–32)
Chloride: 98 mEq/L (ref 96–112)
Creatinine, Ser: 1.76 mg/dL — ABNORMAL HIGH (ref 0.40–1.50)
GFR: 55.49 mL/min — ABNORMAL LOW (ref >60.00)
Glucose, Bld: 96 mg/dL (ref 70–99)
POTASSIUM: 3.6 meq/L (ref 3.5–5.1)
SODIUM: 136 meq/L (ref 135–145)

## 2018-08-08 MED ORDER — OLMESARTAN-AMLODIPINE-HCTZ 40-10-12.5 MG PO TABS: 1.0000 | ORAL_TABLET | Freq: Every day | ORAL | 2 refills | Status: DC

## 2018-08-08 NOTE — Progress Notes (Signed)
Subjective:    Patient ID: Taylor Herrera, male    DOB: [birth year], 42 y.o.   MRN: [id number]  HPI  Patient is a 42 yr old male who presents today for follow up of his hypertension. Currently on tribenzor. Denies changes in vision or headache. Reports good compliance with medication.  BP Readings from Last 3 Encounters:  08/08/18 (!) 180/120  08/01/18 (!) 200/138  06/14/17 131/77   Home bps reports to range high of 173/113 to low 140's/90'.    Reports that he took his meds at 11 oclock   Review of Systems See HPI  Past Medical History:  Diagnosis Date  . History of kidney stones   . Hypertension   . Migraine      Social History   Socioeconomic History  . Marital status: Married    Spouse name: Not on file  . Number of children: Not on file  . Years of education: Not on file  . Highest education level: Not on file  Occupational History  . Not on file  Social Needs  . Financial resource strain: Not on file  . Food insecurity:    Worry: Not on file    Inability: Not on file  . Transportation needs:    Medical: Not on file    Non-medical: Not on file  Tobacco Use  . Smoking status: Never Smoker  . Smokeless tobacco: Never Used  Substance and Sexual Activity  . Alcohol use: No    Comment: occasional  . Drug use: No  . Sexual activity: Not on file  Lifestyle  . Physical activity:    Days per week: Not on file    Minutes per session: Not on file  . Stress: Not on file  Relationships  . Social connections:    Talks on phone: Not on file    Gets together: Not on file    Attends religious service: Not on file    Active member of club or organization: Not on file    Attends meetings of clubs or organizations: Not on file    Relationship status: Not on file  . Intimate partner violence:    Fear of current or ex partner: Not on file    Emotionally abused: Not on file    Physically abused: Not on file    Forced sexual activity: Not on file  Other Topics  Concern  . 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  Family History  Problem Relation Age of Onset  . Cancer Mother        multiple myeloma, ovarian  . Hypertension Mother   . Hypertension Father   . Arrhythmia Father   . Kidney disease Father        ESRD on HD    No Known Allergies  Current Outpatient Medications on File Prior to Visit  Medication Sig Dispense Refill  . potassium chloride SA (K-DUR,KLOR-CON) 20 MEQ tablet Take 1 tablet (20 mEq total) by mouth daily. 30 tablet 5   No current facility-administered medications on file prior to visit.     BP (!) 162/120   Pulse 91   Temp 98 F (36.7 C) (Oral)   Resp 16   Ht 6' 1"  (1.854 m)   Wt  233 lb 6.4 oz (105.9 kg)   SpO2 98%   BMI 30.79 kg/m       Objective:   Physical Exam  Constitutional: He is oriented to person, place, and time. He appears well-developed and well-nourished. No distress.  HENT:  Head: Normocephalic and atraumatic.  Cardiovascular: Normal rate and regular rhythm.  No murmur heard. Pulmonary/Chest: Effort normal and breath sounds normal. No respiratory distress. He has no wheezes. He has no rales.  Musculoskeletal: He exhibits no edema.  Neurological: He is alert and oriented to person, place, and time.  Skin: Skin is warm and dry.  Psychiatric: He has a normal mood and affect. His behavior is normal. Thought content normal.          Assessment & Plan:  HTN- still uncontrolled, but improving. Obtain follow up  Bmet. Advised pt as follows:  Please complete lab work prior to leaving. Increase tribenzor to 40-1-12.104m. Continue to monitor your blood pressure daily at home. Call is >180/120.

## 2018-08-08 NOTE — Telephone Encounter (Signed)
See pt emails from 08/06/18.

## 2018-08-08 NOTE — Patient Instructions (Signed)
Please complete lab work prior to leaving. Increase tribenzor to 40-1-12.5mg . Continue to monitor your blood pressure daily at home. Call is >180/120.

## 2018-08-22 ENCOUNTER — Encounter: Payer: Self-pay | Admitting: Family

## 2018-08-22 ENCOUNTER — Ambulatory Visit: Payer: PRIVATE HEALTH INSURANCE | Admitting: Family

## 2018-08-22 VITALS — BP 134/73 | HR 83 | Temp 98.3°F | Resp 16 | Ht 73.0 in | Wt 233.0 lb

## 2018-08-22 DIAGNOSIS — I10 Essential (primary) hypertension: Secondary | ICD-10-CM

## 2018-08-22 DIAGNOSIS — N289 Disorder of kidney and ureter, unspecified: Secondary | ICD-10-CM

## 2018-08-22 LAB — BASIC METABOLIC PANEL
BUN: 30 mg/dL — AB (ref 6–23)
CO2: 27 mEq/L (ref 19–32)
CREATININE: 1.83 mg/dL — AB (ref 0.40–1.50)
Calcium: 10.1 mg/dL (ref 8.4–10.5)
Chloride: 100 mEq/L (ref 96–112)
GFR: 53.04 mL/min — AB (ref >60.00)
Glucose, Bld: 96 mg/dL (ref 70–99)
Potassium: 3.8 mEq/L (ref 3.5–5.1)
Sodium: 136 mEq/L (ref 135–145)

## 2018-08-22 MED ORDER — OLMESARTAN-AMLODIPINE-HCTZ 40-10-12.5 MG PO TABS: 1.0000 | ORAL_TABLET | Freq: Every day | ORAL | 0 refills | Status: DC

## 2018-08-22 NOTE — Progress Notes (Signed)
Subjective:    Patient ID: Taylor Herrera, male    DOB: 1976-05-10, 42 y.o.   MRN: [id number]  HPI  Patient is a 42 yr old male who presents today for follow up of his hypertension. Last visit we increased his tribenzor dose. Denies dizziness.  Denies HA/SOB or leg swelling.  Reports that he feels well. He is continuing to avoid alcohol.  BP Readings from Last 3 Encounters:  08/22/18 134/73  08/08/18 (!) 162/120  08/01/18 (!) 200/138     Review of Systems    see HPI  Past Medical History:  Diagnosis Date  . History of kidney stones   . Hypertension   . Migraine      Social History   Socioeconomic History  . Marital status: Married    Spouse name: Not on file  . Number of children: Not on file  . Years of education: Not on file  . Highest education level: Not on file  Occupational History  . Not on file  Social Needs  . Financial resource strain: Not on file  . Food insecurity:    Worry: Not on file    Inability: Not on file  . Transportation needs:    Medical: Not on file    Non-medical: Not on file  Tobacco Use  . Smoking status: Never Smoker  . Smokeless tobacco: Never Used  Substance and Sexual Activity  . Alcohol use: No    Comment: occasional  . Drug use: No  . Sexual activity: Not on file  Lifestyle  . Physical activity:    Days per week: Not on file    Minutes per session: Not on file  . Stress: Not on file  Relationships  . Social connections:    Talks on phone: Not on file    Gets together: Not on file    Attends religious service: Not on file    Active member of club or organization: Not on file    Attends meetings of clubs or organizations: Not on file    Relationship status: Not on file  . Intimate partner violence:    Fear of current or ex partner: Not on file    Emotionally abused: Not on file    Physically abused: Not on file    Forced sexual activity: Not on file  Other Topics Concern  . 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  Family History  Problem Relation Age of Onset  . Cancer Mother        multiple myeloma, ovarian  . Hypertension Mother   . Hypertension Father   . Arrhythmia Father   . Kidney disease Father        ESRD on HD    No Known Allergies  Current Outpatient Medications on File Prior to Visit  Medication Sig Dispense Refill  . potassium chloride SA (K-DUR,KLOR-CON) 20 MEQ tablet Take 1 tablet (20 mEq total) by mouth daily. 30 tablet 5   No current facility-administered medications on file prior to visit.     BP 134/73 (BP Location: Left Arm, Patient Position: Sitting, Cuff Size: Large)   Pulse 83   Temp 98.3 F (36.8 C) (Oral)   Resp 16   Ht 6' 1" (1.854 m)  Wt 233 lb (105.7 kg)   SpO2 100%   BMI 30.74 kg/m    Objective:   Physical Exam  Constitutional: He is oriented to person, place, and time. He appears well-developed and well-nourished. No distress.  HENT:  Head: Normocephalic and atraumatic.  Cardiovascular: Normal rate and regular rhythm.  No murmur heard. Pulmonary/Chest: Effort normal and breath sounds normal. No respiratory distress. He has no wheezes. He has no rales.  Musculoskeletal: He exhibits no edema.  Neurological: He is alert and oriented to person, place, and time.  Skin: Skin is warm and dry.  Psychiatric: He has a normal mood and affect. His behavior is normal. Thought content normal.          Assessment & Plan:  HTN- at goal. Reinforced importance of med compliance. Continue current dose of tribenzor. Obtain follow up bmet.  Renal insufficiency-  Likely related to hx of uncontrolled HTN. I hope it will improve now that his bp is improved. However, I did stress the importance of strict bp control to protect his kidneys from further damage.  

## 2018-08-22 NOTE — Patient Instructions (Signed)
Please complete lab work prior to leaving.   

## 2018-08-23 ENCOUNTER — Encounter: Payer: Self-pay | Admitting: Family

## 2018-11-26 ENCOUNTER — Ambulatory Visit: Payer: PRIVATE HEALTH INSURANCE | Admitting: Family

## 2018-12-24 ENCOUNTER — Ambulatory Visit (INDEPENDENT_AMBULATORY_CARE_PROVIDER_SITE_OTHER): Payer: Managed Care, Other (non HMO) | Admitting: Family

## 2018-12-24 ENCOUNTER — Ambulatory Visit: Payer: PRIVATE HEALTH INSURANCE | Admitting: Family

## 2018-12-24 VITALS — BP 116/64 | HR 92 | Temp 98.6°F | Resp 16 | Ht 73.0 in | Wt 240.0 lb

## 2018-12-24 DIAGNOSIS — I10 Essential (primary) hypertension: Secondary | ICD-10-CM

## 2018-12-24 DIAGNOSIS — J069 Acute upper respiratory infection, unspecified: Secondary | ICD-10-CM

## 2018-12-24 DIAGNOSIS — F1011 Alcohol abuse, in remission: Secondary | ICD-10-CM

## 2018-12-24 DIAGNOSIS — B9789 Other viral agents as the cause of diseases classified elsewhere: Secondary | ICD-10-CM | POA: Diagnosis not present

## 2018-12-24 LAB — BASIC METABOLIC PANEL
BUN: 27 mg/dL — ABNORMAL HIGH (ref 6–23)
CHLORIDE: 99 meq/L (ref 96–112)
CO2: 27 mEq/L (ref 19–32)
CREATININE: 2.11 mg/dL — AB (ref 0.40–1.50)
Calcium: 9.4 mg/dL (ref 8.4–10.5)
GFR: 42.27 mL/min — ABNORMAL LOW (ref >60.00)
Glucose, Bld: 93 mg/dL (ref 70–99)
Potassium: 3.8 mEq/L (ref 3.5–5.1)
Sodium: 136 mEq/L (ref 135–145)

## 2018-12-24 MED ORDER — POTASSIUM CHLORIDE CRYS ER 20 MEQ PO TBCR: 20.0000 meq | EXTENDED_RELEASE_TABLET | Freq: Every day | ORAL | 1 refills | Status: DC

## 2018-12-24 MED ORDER — OLMESARTAN-AMLODIPINE-HCTZ 40-10-12.5 MG PO TABS: 1.0000 | ORAL_TABLET | Freq: Every day | ORAL | 1 refills | Status: DC

## 2018-12-24 NOTE — Patient Instructions (Signed)
Please complete lab work prior to leaving.   

## 2018-12-24 NOTE — Progress Notes (Signed)
Subjective:    Patient ID: Taylor Herrera, male    DOB: 08/27/1976, 43 y.o.   MRN: [id number]  HPI   Patient is a 43 yr old male who presents today for follow up of his hypertension. He reports feeling well on current blood pressure medications. Denies LE edema.    BP Readings from Last 3 Encounters:  12/24/18 116/64  08/22/18 134/73  08/08/18 (!) 162/120   Notes nasal congestion, itchy ears. Report mild productive cough.   Hx of ETOH abuse- reports that he remains alcohol free.   Review of Systems See HPI  Past Medical History:  Diagnosis Date  . History of kidney stones   . Hypertension   . Migraine      Social History   Socioeconomic History  . Marital status: Married    Spouse name: Not on file  . Number of children: Not on file  . Years of education: Not on file  . Highest education level: Not on file  Occupational History  . Not on file  Social Needs  . Financial resource strain: Not on file  . Food insecurity:    Worry: Not on file    Inability: Not on file  . Transportation needs:    Medical: Not on file    Non-medical: Not on file  Tobacco Use  . Smoking status: Never Smoker  . Smokeless tobacco: Never Used  Substance and Sexual Activity  . Alcohol use: No    Comment: occasional  . Drug use: No  . Sexual activity: Not on file  Lifestyle  . Physical activity:    Days per week: Not on file    Minutes per session: Not on file  . Stress: Not on file  Relationships  . Social connections:    Talks on phone: Not on file    Gets together: Not on file    Attends religious service: Not on file    Active member of club or organization: Not on file    Attends meetings of clubs or organizations: Not on file    Relationship status: Not on file  . Intimate partner violence:    Fear of current or ex partner: Not on file    Emotionally abused: Not on file    Physically abused: Not on file    Forced sexual activity: Not on file  Other Topics Concern    . 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  Family History  Problem Relation Age of Onset  . Cancer Mother        multiple myeloma, ovarian  . Hypertension Mother   . Hypertension Father   . Arrhythmia Father   . Kidney disease Father        ESRD on HD    No Known Allergies  Current Outpatient Medications on File Prior to Visit  Medication Sig Dispense Refill  . Olmesartan-amLODIPine-HCTZ 40-10-12.5 MG TABS Take 1 tablet by mouth daily. 90 tablet 0  . potassium chloride SA (K-DUR,KLOR-CON) 20 MEQ tablet Take 1 tablet (20 mEq total) by mouth daily. 30 tablet 5   No current facility-administered medications on file prior to visit.     BP 116/64 (BP Location: Right Arm, Patient Position: Sitting, Cuff Size: Large)   Pulse  92   Temp 98.6 F (37 C) (Oral)   Resp 16   Ht 6' 1"  (1.854 m)   Wt 240 lb (108.9 kg)   SpO2 98%   BMI 31.66 kg/m       Objective:   Physical Exam Constitutional:      General: He is not in acute distress.    Appearance: He is well-developed.  HENT:     Head: Normocephalic and atraumatic.     Ears:     Comments: R ear canal- skin appears dry. R TM is mildly pink in color, no bulging.   R ear canal also appears to have dry skin.     Mouth/Throat:     Pharynx: No posterior oropharyngeal erythema.     Tonsils: No tonsillar exudate or tonsillar abscesses.  Cardiovascular:     Rate and Rhythm: Normal rate and regular rhythm.     Heart sounds: No murmur.  Pulmonary:     Effort: Pulmonary effort is normal. No respiratory distress.     Breath sounds: Normal breath sounds. No wheezing or rales.  Lymphadenopathy:     Cervical: No cervical adenopathy.  Skin:    General: Skin is warm and dry.  Neurological:     Mental Status: He is alert and oriented  to person, place, and time.  Psychiatric:        Behavior: Behavior normal.        Thought Content: Thought content normal.           Assessment & Plan:  Viral URI with cough- mild symptoms. C/o itching in his ears. Will add zyrtec once daily. He is advised to call if symptoms fail to improve.  HTN- bp looks great. Continue current medications. Obtain follow up bmet.  Hx of ETOH abuse - remains alcohol free. I commended him for this.

## 2018-12-25 ENCOUNTER — Telehealth: Payer: Self-pay | Admitting: Family

## 2018-12-25 DIAGNOSIS — N289 Disorder of kidney and ureter, unspecified: Secondary | ICD-10-CM

## 2018-12-25 DIAGNOSIS — I10 Essential (primary) hypertension: Secondary | ICD-10-CM

## 2018-12-25 NOTE — Telephone Encounter (Signed)
See mychart.  

## 2018-12-26 NOTE — Addendum Note (Signed)
Addended by: Sandford Craze on: 12/26/2018 08:12 AM   Modules accepted: Orders

## 2019-01-02 ENCOUNTER — Encounter: Payer: Self-pay | Admitting: Cardiology

## 2019-01-02 ENCOUNTER — Ambulatory Visit (INDEPENDENT_AMBULATORY_CARE_PROVIDER_SITE_OTHER): Payer: Managed Care, Other (non HMO) | Admitting: Cardiology

## 2019-01-02 VITALS — Ht 74.0 in | Wt 241.1 lb

## 2019-01-02 DIAGNOSIS — I10 Essential (primary) hypertension: Secondary | ICD-10-CM | POA: Diagnosis not present

## 2019-01-02 MED ORDER — EPLERENONE 25 MG PO TABS: 25.0000 mg | ORAL_TABLET | Freq: Every day | ORAL | 1 refills | Status: DC

## 2019-01-02 NOTE — Patient Instructions (Addendum)
Medication Instructions:  Your physician has recommended you make the following change in your medication:  DISCONTINUE taking potassium chloride START taking eplerenone (Inspra) 25mg -1 tablet daily   If you need a refill on your cardiac medications before your next appointment, please call your pharmacy.   Lab work: NONE    Testing/Procedures: An EKG was performed today.  Follow-Up: At [facility], you and your health needs are our priority.  As part of our continuing mission to provide you with exceptional heart care, we have created designated Provider Care Teams.  These Care Teams include your primary Cardiologist (physician) and Advanced Practice Providers (APPs -  Physician Assistants and Nurse Practitioners) who all work together to provide you with the care you need, when you need it. You will need a follow up appointment in 6 weeks.    Any Other Special Instructions Will Be Listed Below (If Applicable). Please start checking you blood pressure at home daily. Please record and bring this record back to your next appointment with Dr. Dulce Sellar.     Eplerenone tablets What is this medicine? EPLERENONE (e PLER en one) is used to treat high blood pressure. This medicine is also used to improve symptoms of heart failure. This medicine may be used for other purposes; ask your health care provider or pharmacist if you have questions. COMMON BRAND NAME(S): Inspra What should I tell my health care provider before I take this medicine? They need to know if you have any of these conditions: -diabetes -high blood level of potassium -if you are on a special diet, such as a low-salt diet and are using dietary salt substitutes -kidney disease -liver disease -an unusual or allergic reaction to eplerenone, other medicines, foods, dyes, or preservatives -pregnant or trying to get pregnant -breast-feeding How should I use this medicine? Take this medicine by mouth with a glass of  water. Follow the directions on the prescription label. You may take this medicine with or without food. Take your doses at regular intervals. Do not take your medicine more often than directed. Do not stop taking except on the advice of your doctor or health care professional. Talk to your pediatrician regarding the use of this medicine in children. Special care may be needed. Overdosage: If you think you have taken too much of this medicine contact a poison control center or emergency room at once. NOTE: This medicine is only for you. Do not share this medicine with others. What if I miss a dose? If you miss a dose, take it as soon as you can. If it is almost time for your next dose, take only that dose. Do not take double or extra doses. What may interact with this medicine? Do not take this medicine with any of the following medications: -boceprevir -certain antibiotics like chloramphenicol, clarithromycin, dalfopristin; quinupristin, and telithromycin -certain diuretics like amiloride, spironolactone, and triamterene -certain medicines for fungal infections like itraconazole, ketoconazole, posaconazole, and voriconazole -certain medicines for HIV or AIDS like atazanavir, cobicistat, darunavir, delavirdine, fosamprenavir, indinavir, nelfinavir, ritonavir, saquinavir boosted with ritonavir, and tipranavir -conivaptan -grapefruit and grapefruit juice -idelalisib -mifepristone -nefazodone -potassium salts or supplements This medicine may also interact with the following medications: -certain medicines for high blood pressure like enalapril, candesartan, lisinopril, and valsartan -erythromycin -fluconazole -lithium -NSAIDs, medicines for pain and inflammation, like ibuprofen or naproxen -verapamil This list may not describe all possible interactions. Give your health care provider a list of all the medicines, herbs, non-prescription drugs, or dietary supplements you use. Also  tell them if  you smoke, drink alcohol, or use illegal drugs. Some items may interact with your medicine. What should I watch for while using this medicine? Visit your doctor or health care professional for regular checks on your progress. Check your blood pressure as directed. Ask your doctor or health care professional what your blood pressure should be and when you should contact him or her. You may need to be on a special diet while taking this medicine. Ask your doctor. Also, ask how many glasses of fluid you need to drink each day. You must not get dehydrated. You may get drowsy or dizzy. Do not drive, use machinery, or do anything that needs mental alertness until you know how this medicine affects you. Do not stand or sit up quickly, especially if you are an older patient. This reduces the risk of dizzy or fainting spells. Alcohol may interfere with the effect of this medicine. Avoid alcoholic drinks. What side effects may I notice from receiving this medicine? Side effects that you should report to your doctor or health care professional as soon as possible: -allergic reactions like skin rash, itching or hives, swelling of the face, lips, or tongue -chest pain -confusion -enlarged breasts or breast pain in males -fast or irregular heartbeat, palpitations -increased hair growth in females -irregular menstrual periods -sexual difficulty -unusually weak or tired Side effects that usually do not require medical attention (report to your doctor or health care professional if they continue or are bothersome): -cough -diarrhea -fatigue -headache -stomach pain This list may not describe all possible side effects. Call your doctor for medical advice about side effects. You may report side effects to FDA at 1-800-FDA-1088. Where should I keep my medicine? Keep out of the reach of children. Store at room temperature between 15 and 30 degrees C (59 and 86 degrees F). Throw away any unused medicine after the  expiration date. NOTE: This sheet is a summary. It may not cover all possible information. If you have questions about this medicine, talk to your doctor, pharmacist, or health care provider.  2019 Elsevier/Gold Standard (2016-12-07 14:22:43)          [employer]  Tips to measure your blood pressure correctly  To determine whether you have hypertension, a medical professional will take a blood pressure reading. How you prepare for the test, the position of your arm, and other factors can change a blood pressure reading by 10% or more. That could be enough to hide high blood pressure, start you on a drug you don't really need, or lead your doctor to incorrectly adjust your medications. National and international guidelines offer specific instructions for measuring blood pressure. If a doctor, nurse, or medical assistant isn't doing it right, don't hesitate to ask him or her to get with the guidelines. Here's what you can do to ensure a correct reading: . Don't drink a caffeinated beverage or smoke during the 30 minutes before the test. . Sit quietly for five minutes before the test begins. . During the measurement, sit in a chair with your feet on the floor and your arm supported so your elbow is at about heart level. . The inflatable part of the cuff should completely cover at least 80% of your upper arm, and the cuff should be placed on bare skin, not over a shirt. . Don't talk during the measurement. . Have your blood pressure measured twice, with a brief break in between. If the readings are different by 5 points  or more, have it done a third time. There are times to break these rules. If you sometimes feel lightheaded when getting out of bed in the morning or when you stand after sitting, you should have your blood pressure checked while seated and then while standing to see if it falls from one position to the next. Because blood pressure varies throughout the day, your doctor will  rarely diagnose hypertension on the basis of a single reading. Instead, he or she will want to confirm the measurements on at least two occasions, usually within a few weeks of one another. The exception to this rule is if you have a blood pressure reading of 180/110 mm Hg or higher. A result this high usually calls for prompt treatment. It's also a good idea to have your blood pressure measured in both arms at least once, since the reading in one arm (usually the right) may be higher than that in the left. A 2014 study in The American Journal of Medicine of nearly 3,400 people found average arm- to-arm differences in systolic blood pressure of about 5 points. The higher number should be used to make treatment decisions. In 2017, new guidelines from the American Heart Association, the [employer] of [employer], and nine other health organizations lowered the diagnosis of high blood pressure to 130/80 mm Hg or higher for all adults. The guidelines also redefined the various blood pressure categories to now include normal, elevated, Stage 1 hypertension, Stage 2 hypertension, and hypertensive crisis (see "Blood pressure categories"). Blood pressure categories  Blood pressure category SYSTOLIC (upper number)  DIASTOLIC (lower number)  Normal Less than 120 mm Hg and Less than 80 mm Hg  Elevated 120-129 mm Hg and Less than 80 mm Hg  High blood pressure: Stage 1 hypertension 130-139 mm Hg or 80-89 mm Hg  High blood pressure: Stage 2 hypertension 140 mm Hg or higher or 90 mm Hg or higher  Hypertensive crisis (consult your doctor immediately) Higher than 180 mm Hg and/or Higher than 120 mm Hg  Source: American Heart Association and American Stroke Association. For more on getting your blood pressure under control, buy Controlling Your Blood Pressure, a Special Health Report from [facility].DASH diet: Healthy eating to lower your blood pressure The DASH diet emphasizes portion size, eating a  variety of foods and getting the right amount of nutrients. Discover how DASH can improve your health and lower your blood pressure. By [facility] Staff  DASH stands for Dietary Approaches to Stop Hypertension. The DASH diet is a lifelong approach to healthy eating that's designed to help treat or prevent high blood pressure (hypertension). The DASH diet encourages you to reduce the sodium in your diet and eat a variety of foods rich in nutrients that help lower blood pressure, such as potassium, calcium and magnesium. By following the DASH diet, you may be able to reduce your blood pressure by a few points in just two weeks. Over time, your systolic blood pressure could drop by eight to 14 points, which can make a significant difference in your health risks. Because the DASH diet is a healthy way of eating, it offers health benefits besides just lowering blood pressure. The DASH diet is also in line with dietary recommendations to prevent osteoporosis, cancer, heart disease, stroke and diabetes. DASH diet: Sodium levels The DASH diet emphasizes vegetables, fruits and low-fat dairy foods - and moderate amounts of whole grains, fish, poultry and nuts. In addition to the standard DASH  diet, there is also a lower sodium version of the diet. You can choose the version of the diet that meets your health needs: Standard DASH diet. You can consume up to 2,300 milligrams (mg) of sodium a day.  Lower sodium DASH diet. You can consume up to 1,500 mg of sodium a day. Both versions of the DASH diet aim to reduce the amount of sodium in your diet compared with what you might get in a typical American diet, which can amount to a whopping 3,400 mg of sodium a day or more. The standard DASH diet meets the recommendation from the Dietary Guidelines for Americans to keep daily sodium intake to less than 2,300 mg a day. The American Heart Association recommends 1,500 mg a day of sodium as an upper limit for all adults. If  you aren't sure what sodium level is right for you, talk to your doctor. DASH diet: What to eat Both versions of the DASH diet include lots of whole grains, fruits, vegetables and low-fat dairy products. The DASH diet also includes some fish, poultry and legumes, and encourages a small amount of nuts and seeds a few times a week.  You can eat red meat, sweets and fats in small amounts. The DASH diet is low in saturated fat, cholesterol and total fat. Here's a look at the recommended servings from each food group for the 2,000-calorie-a-day DASH diet. Grains: 6 to 8 servings a day Grains include bread, cereal, rice and pasta. Examples of one serving of grains include 1 slice whole-wheat bread, 1 ounce dry cereal, or 1/2 cup cooked cereal, rice or pasta. Focus on whole grains because they have more fiber and nutrients than do refined grains. For instance, use brown rice instead of white rice, whole-wheat pasta instead of regular pasta and whole-grain bread instead of white bread. Look for products labeled "100 percent whole grain" or "100 percent whole wheat."  Grains are naturally low in fat. Keep them this way by avoiding butter, cream and cheese sauces. Vegetables: 4 to 5 servings a day Tomatoes, carrots, broccoli, sweet potatoes, greens and other vegetables are full of fiber, vitamins, and such minerals as potassium and magnesium. Examples of one serving include 1 cup raw leafy green vegetables or 1/2 cup cut-up raw or cooked vegetables. Don't think of vegetables only as side dishes - a hearty blend of vegetables served over brown rice or whole-wheat noodles can serve as the main dish for a meal.  Fresh and frozen vegetables are both good choices. When buying frozen and canned vegetables, choose those labeled as low sodium or without added salt.  To increase the number of servings you fit in daily, be creative. In a stir-fry, for instance, cut the amount of meat in half and double up on the  vegetables. Fruits: 4 to 5 servings a day Many fruits need little preparation to become a healthy part of a meal or snack. Like vegetables, they're packed with fiber, potassium and magnesium and are typically low in fat - coconuts are an exception. Examples of one serving include one medium fruit, 1/2 cup fresh, frozen or canned fruit, or 4 ounces of juice. Have a piece of fruit with meals and one as a snack, then round out your day with a dessert of fresh fruits topped with a dollop of low-fat yogurt.  Leave on edible peels whenever possible. The peels of apples, pears and most fruits with pits add interesting texture to recipes and contain healthy nutrients and fiber.  Remember that citrus fruits and juices, such as grapefruit, can interact with certain medications, so check with your doctor or pharmacist to see if they're OK for you.  If you choose canned fruit or juice, make sure no sugar is added. Dairy: 2 to 3 servings a day Milk, yogurt, cheese and other dairy products are major sources of calcium, vitamin D and protein. But the key is to make sure that you choose dairy products that are low fat or fat-free because otherwise they can be a major source of fat - and most of it is saturated. Examples of one serving include 1 cup skim or 1 percent milk, 1 cup low fat yogurt, or 1 1/2 ounces part-skim cheese. Low-fat or fat-free frozen yogurt can help you boost the amount of dairy products you eat while offering a sweet treat. Add fruit for a healthy twist.  If you have trouble digesting dairy products, choose lactose-free products or consider taking an over-the-counter product that contains the enzyme lactase, which can reduce or prevent the symptoms of lactose intolerance.  Go easy on regular and even fat-free cheeses because they are typically high in sodium. Lean meat, poultry and fish: 6 servings or fewer a day Meat can be a rich source of protein, B vitamins, iron and zinc. Choose lean varieties  and aim for no more than 6 ounces a day. Cutting back on your meat portion will allow room for more vegetables. Trim away skin and fat from poultry and meat and then bake, broil, grill or roast instead of frying in fat.  Eat heart-healthy fish, such as salmon, herring and tuna. These types of fish are high in omega-3 fatty acids, which can help lower your total cholesterol. Nuts, seeds and legumes: 4 to 5 servings a week Almonds, sunflower seeds, kidney beans, peas, lentils and other foods in this family are good sources of magnesium, potassium and protein. They're also full of fiber and phytochemicals, which are plant compounds that may protect against some cancers and cardiovascular disease. Serving sizes are small and are intended to be consumed only a few times a week because these foods are high in calories. Examples of one serving include 1/3 cup nuts, 2 tablespoons seeds, or 1/2 cup cooked beans or peas.  Nuts sometimes get a bad rap because of their fat content, but they contain healthy types of fat - monounsaturated fat and omega-3 fatty acids. They're high in calories, however, so eat them in moderation. Try adding them to stir-fries, salads or cereals.  Soybean-based products, such as tofu and tempeh, can be a good alternative to meat because they contain all of the amino acids your body needs to make a complete protein, just like meat. Fats and oils: 2 to 3 servings a day Fat helps your body absorb essential vitamins and helps your body's immune system. But too much fat increases your risk of heart disease, diabetes and obesity. The DASH diet strives for a healthy balance by limiting total fat to less than 30 percent of daily calories from fat, with a focus on the healthier monounsaturated fats. Examples of one serving include 1 teaspoon soft margarine, 1 tablespoon mayonnaise or 2 tablespoons salad dressing. Saturated fat and trans fat are the main dietary culprits in increasing your risk of  coronary artery disease. DASH helps keep your daily saturated fat to less than 6 percent of your total calories by limiting use of meat, butter, cheese, whole milk, cream and eggs in your diet, along with  foods made from lard, solid shortenings, and palm and coconut oils.  Avoid trans fat, commonly found in such processed foods as crackers, baked goods and fried items.  Read food labels on margarine and salad dressing so that you can choose those that are lowest in saturated fat and free of trans fat. Sweets: 5 servings or fewer a week You don't have to banish sweets entirely while following the DASH diet - just go easy on them. Examples of one serving include 1 tablespoon sugar, jelly or jam, 1/2 cup sorbet, or 1 cup lemonade. When you eat sweets, choose those that are fat-free or low-fat, such as sorbets, fruit ices, jelly beans, hard candy, graham crackers or low-fat cookies.  Artificial sweeteners such as aspartame (NutraSweet, Equal) and sucralose (Splenda) may help satisfy your sweet tooth while sparing the sugar. But remember that you still must use them sensibly. It's OK to swap a diet cola for a regular cola, but not in place of a more nutritious beverage such as low-fat milk or even plain water.  Cut back on added sugar, which has no nutritional value but can pack on calories. DASH diet: Alcohol and caffeine Drinking too much alcohol can increase blood pressure. The Dietary Guidelines for Americans recommends that men limit alcohol to no more than two drinks a day and women to one or less. The DASH diet doesn't address caffeine consumption. The influence of caffeine on blood pressure remains unclear. But caffeine can cause your blood pressure to rise at least temporarily. If you already have high blood pressure or if you think caffeine is affecting your blood pressure, talk to your doctor about your caffeine consumption. DASH diet and weight loss While the DASH diet is not a weight-loss  program, you may indeed lose unwanted pounds because it can help guide you toward healthier food choices. The DASH diet generally includes about 2,000 calories a day. If you're trying to lose weight, you may need to eat fewer calories. You may also need to adjust your serving goals based on your individual circumstances - something your health care team can help you decide. Tips to cut back on sodium The foods at the core of the DASH diet are naturally low in sodium. So just by following the DASH diet, you're likely to reduce your sodium intake. You also reduce sodium further by: Using sodium-free spices or flavorings with your food instead of salt  Not adding salt when cooking rice, pasta or hot cereal  Rinsing canned foods to remove some of the sodium  Buying foods labeled "no salt added," "sodium-free," "low sodium" or "very low sodium" One teaspoon of table salt has 2,325 mg of sodium. When you read food labels, you may be surprised at just how much sodium some processed foods contain. Even low-fat soups, canned vegetables, ready-to-eat cereals and sliced [place] from the local deli - foods you may have considered healthy - often have lots of sodium. You may notice a difference in taste when you choose low-sodium food and beverages. If things seem too bland, gradually introduce low-sodium foods and cut back on table salt until you reach your sodium goal. That'll give your palate time to adjust. Using salt-free seasoning blends or herbs and spices may also ease the transition. It can take several weeks for your taste buds to get used to less salty foods. Putting the pieces of the DASH diet together Try these strategies to get started on the DASH diet:  Change gradually. If you now  eat only one or two servings of fruits or vegetables a day, try to add a serving at lunch and one at dinner. Rather than switching to all whole grains, start by making one or two of your grain servings whole grains.  Increasing fruits, vegetables and whole grains gradually can also help prevent bloating or diarrhea that may occur if you aren't used to eating a diet with lots of fiber. You can also try over-the-counter products to help reduce gas from beans and vegetables.  Reward successes and forgive slip-ups. Reward yourself with a nonfood treat for your accomplishments - rent a movie, purchase a book or get together with a friend. Everyone slips, especially when learning something new. Remember that changing your lifestyle is a long-term process. Find out what triggered your setback and then just pick up where you left off with the DASH diet.  Add physical activity. To boost your blood pressure lowering efforts even more, consider increasing your physical activity in addition to following the DASH diet. Combining both the DASH diet and physical activity makes it more likely that you'll reduce your blood pressure.  Get support if you need it. If you're having trouble sticking to your diet, talk to your doctor or dietitian about it. You might get some tips that will help you stick to the DASH diet. Remember, healthy eating isn't an all-or-nothing proposition. What's most important is that, on average, you eat healthier foods with plenty of variety - both to keep your diet nutritious and to avoid boredom or extremes. And with the DASH diet, you can have both.

## 2019-01-02 NOTE — Progress Notes (Signed)
Cardiology Office Note:    Date:  01/02/2019   ID:  Taylor Herrera, DOB [birth year], MRN [id number]  PCP:  Taylor Herrera, Melissa, NP  Cardiologist:  Norman HerrlichBrian Carsten Carstarphen, MD   Referring MD: Taylor Herrera, Melissa, NP  ASSESSMENT:    1. Essential hypertension    PLAN:    In order of problems listed above:  1. Repeat blood pressure by me 144/80 in the office.  Unfortunately despite 3 drugs and good compliance he remains above target after discussion will place him on a low-dose of distal diuretic Inspra follow home blood pressures and I suspect that he will come in 2. He will stop potassium to recheck renal function next visit.  If blood pressure remains uncontrolled further evaluation including consideration of a renal artery duplex and a renal and aldosterone level I do not think these are needed at this time.  I encouraged him to continue exercise sodium restriction plant-based diet  Next appointment   Medication Adjustments/Labs and Tests Ordered: Current medicines are reviewed at length with the patient today.  Concerns regarding medicines are outlined above.  No orders of the defined types were placed in this encounter.  No orders of the defined types were placed in this encounter.    Chief Complaint  Patient presents with  . Hypertension    History of Present Illness:    Taylor Herrera is a 43 y.o. male who is being seen today for the evaluation of hypertension at the request of Taylor Herrera, Melissa, NP.  He has had hypertension for about 4 years and his home blood pressure tends around the range of 1 40-1 50 systolic.  He is compliant with medications does not smoke or drink does not add salt to his diet exercises is initiating a plant-based diet to achieve ideal body weight.  No headache chest pain shortness of breath palpitation or syncope no history of kidney disease congenital rheumatic heart disease and renal function is normal.  Presently is taking a combination ARB CCB  thiazide diuretic Past Medical History:  Diagnosis Date  . Essential hypertension 09/16/2014  . History of alcohol abuse 08/01/2018  . History of kidney stones   . HTN (hypertension) 04/23/2015  . Hypertension   . Migraine   . Preventative health care 04/23/2015    History reviewed. No pertinent surgical history.  Current Medications: No outpatient medications have been marked as taking for the 01/02/19 encounter (Office Visit) with Baldo DaubMunley, Tawfiq Favila J, MD.     Allergies:   Patient has no known allergies.   Social History   Socioeconomic History  . Marital status: Married    Spouse name: Not on file  . Number of children: Not on file  . Years of education: Not on file  . Highest education level: Not on file  Occupational History  . Not on file  Social Needs  . Financial resource strain: Not on file  . Food insecurity:    Worry: Not on file    Inability: Not on file  . Transportation needs:    Medical: Not on file    Non-medical: Not on file  Tobacco Use  . Smoking status: Never Smoker  . Smokeless tobacco: Never Used  Substance and Sexual Activity  . Alcohol use: No    Comment: occasional  . Drug use: No  . Sexual activity: Not on file  Lifestyle  . Physical activity:    Days per week: Not on file    Minutes per session: Not on file  . Stress:  Not on file  Relationships  . Social connections:    Talks on phone: Not on file    Gets together: Not on file    Attends religious service: Not on file    Active member of club or organization: Not on file    Attends meetings of clubs or organizations: Not on file    Relationship status: Not on file  Other Topics Concern  . 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  ROS:   ROS Please see the history of present illness.     All other systems reviewed and are negative.  EKGs/Labs/Other Studies Reviewed:    The following studies were reviewed today:   EKG:  EKG is  ordered today.  The ekg ordered today demonstrates SRTH LVH and   Recent Labs: 12/24/2018: BUN 27; Creatinine, Ser 2.11; Potassium 3.8; Sodium 136  Recent Lipid Panel    Component Value Date/Time   CHOL 148 06/14/2017 1222   TRIG 104.0 06/14/2017 1222   HDL 47.40 06/14/2017 1222   CHOLHDL 3 06/14/2017 1222   VLDL 20.8 06/14/2017 1222   LDLCALC 80 06/14/2017 1222    Physical Exam:    VS:  Ht 6\' 2"  (1.88 m)   Wt 241 lb 1.9 oz (109.4 kg)   SpO2 98%   BMI 30.96 kg/m     Wt Readings from Last 3 Encounters:  01/02/19 241 lb 1.9 oz (109.4 kg)  12/24/18 240 lb (108.9 kg)  08/22/18 233 lb (105.7 kg)     GEN:  Well nourished, well developed in no acute distress HEENT: Normal NECK: No JVD; No carotid bruits LYMPHATICS: No lymphadenopathy CARDIAC: RRR, no murmurs, rubs, gallops RESPIRATORY:  Clear to auscultation without rales, wheezing or rhonchi  ABDOMEN: Soft, non-tender, non-distended MUSCULOSKELETAL:  No edema; No deformity  SKIN: Warm and dry NEUROLOGIC:  Alert and oriented x 3 PSYCHIATRIC:  Normal affect     Signed, Norman HerrlichBrian Janece Laidlaw, MD  01/02/2019 1:50 PM    [facility]

## 2019-01-03 NOTE — Addendum Note (Signed)
Addended by: Roosvelt Harps R on: 01/03/2019 04:40 PM   Modules accepted: Orders

## 2019-02-22 ENCOUNTER — Telehealth: Payer: Self-pay | Admitting: *Deleted

## 2019-02-22 NOTE — Telephone Encounter (Signed)
Patient has verbally consented to a Webex virtual visit on Tuesday, 02/26/2019, at 1:20 pm. Consent form for patient to review has been sent via MyChart. Patient is driving so he is unable to review right now or download the app, but patient just helped his father do a Webex visit with his PCP yesterday so he is familiar with the process. He is agreeable to download the app before his appointment on Tuesday and will contact our office with any further questions or concerns.         Cardiac Questionnaire:    Since your last visit or hospitalization:    1. Have you been having new or worsening chest pain? No   2. Have you been having new or worsening shortness of breath? No 3. Have you been having new or worsening leg swelling, wt gain, or increase in abdominal girth (pants fitting more tightly)? No   4. Have you had any passing out spells? No     _____________   COVID-19 Pre-Screening Questions:  . Do you currently have a fever? No . Have you recently travelled on a cruise, internationally, or to [place], [place], [place], [place], [place], or [place], [place] [employer]) ? Yes, patient has been to [place] recently.  . Have you been in contact with someone that is currently pending confirmation of Covid19 testing or has been confirmed to have the Covid19 virus?  No Are you currently experiencing fatigue or cough? No

## 2019-02-26 ENCOUNTER — Telehealth: Payer: Self-pay | Admitting: Cardiology

## 2019-03-04 ENCOUNTER — Encounter: Payer: Self-pay | Admitting: Family

## 2019-03-13 ENCOUNTER — Encounter: Payer: Self-pay | Admitting: Family

## 2019-04-11 ENCOUNTER — Encounter: Payer: Self-pay | Admitting: Family

## 2019-06-24 ENCOUNTER — Encounter: Payer: Managed Care, Other (non HMO) | Admitting: Family

## 2019-06-27 ENCOUNTER — Telehealth: Payer: Self-pay | Admitting: Family

## 2019-06-27 NOTE — Telephone Encounter (Signed)
See mychart.  

## 2019-06-28 ENCOUNTER — Encounter: Payer: Self-pay | Admitting: Family

## 2019-10-03 ENCOUNTER — Other Ambulatory Visit: Payer: Self-pay

## 2019-10-03 MED ORDER — OLMESARTAN-AMLODIPINE-HCTZ 40-10-12.5 MG PO TABS: 1.0000 | ORAL_TABLET | Freq: Every day | ORAL | 1 refills | Status: DC

## 2019-11-12 ENCOUNTER — Encounter: Payer: Self-pay | Admitting: Family

## 2019-12-25 ENCOUNTER — Encounter: Payer: Self-pay | Admitting: Family

## 2020-01-31 ENCOUNTER — Encounter: Payer: Self-pay | Admitting: Family

## 2020-01-31 ENCOUNTER — Other Ambulatory Visit: Payer: Self-pay

## 2020-01-31 ENCOUNTER — Ambulatory Visit (INDEPENDENT_AMBULATORY_CARE_PROVIDER_SITE_OTHER): Payer: BLUE CROSS/BLUE SHIELD | Admitting: Family

## 2020-01-31 VITALS — BP 149/82 | HR 105 | Temp 97.1°F | Resp 16 | Ht 74.0 in | Wt 261.0 lb

## 2020-01-31 DIAGNOSIS — I10 Essential (primary) hypertension: Secondary | ICD-10-CM

## 2020-01-31 DIAGNOSIS — Z0000 Encounter for general adult medical examination without abnormal findings: Secondary | ICD-10-CM

## 2020-01-31 DIAGNOSIS — Z125 Encounter for screening for malignant neoplasm of prostate: Secondary | ICD-10-CM

## 2020-01-31 LAB — HEPATIC FUNCTION PANEL
ALT: 24 U/L (ref 0–53)
AST: 23 U/L (ref 0–37)
Albumin: 4 g/dL (ref 3.5–5.2)
Alkaline Phosphatase: 109 U/L (ref 39–117)
Bilirubin, Direct: 0.1 mg/dL (ref 0.0–0.3)
Total Bilirubin: 0.7 mg/dL (ref 0.2–1.2)
Total Protein: 7.1 g/dL (ref 6.0–8.3)

## 2020-01-31 LAB — CBC WITH DIFFERENTIAL/PLATELET
Basophils Absolute: 0.1 10*3/uL (ref 0.0–0.1)
Basophils Relative: 0.6 % (ref 0.0–3.0)
Eosinophils Absolute: 0.3 10*3/uL (ref 0.0–0.7)
Eosinophils Relative: 3 % (ref 0.0–5.0)
HCT: 41.8 % (ref 39.0–52.0)
Hemoglobin: 14.3 g/dL (ref 13.0–17.0)
Lymphocytes Relative: 16.5 % (ref 12.0–46.0)
Lymphs Abs: 1.5 10*3/uL (ref 0.7–4.0)
MCHC: 34.3 g/dL (ref 30.0–36.0)
MCV: 87.7 fl (ref 78.0–100.0)
Monocytes Absolute: 0.5 10*3/uL (ref 0.1–1.0)
Monocytes Relative: 5.2 % (ref 3.0–12.0)
Neutro Abs: 7 10*3/uL (ref 1.4–7.7)
Neutrophils Relative %: 74.7 % (ref 43.0–77.0)
Platelets: 299 10*3/uL (ref 150.0–400.0)
RBC: 4.77 Mil/uL (ref 4.22–5.81)
RDW: 13.9 % (ref 11.5–15.5)
WBC: 9.4 10*3/uL (ref 4.0–10.5)

## 2020-01-31 LAB — LIPID PANEL
Cholesterol: 176 mg/dL (ref 0–200)
HDL: 55.7 mg/dL (ref >39.00)
LDL Cholesterol: 104 mg/dL — ABNORMAL HIGH (ref 0–99)
NonHDL: 120.69
Total CHOL/HDL Ratio: 3
Triglycerides: 84 mg/dL (ref 0.0–149.0)
VLDL: 16.8 mg/dL (ref 0.0–40.0)

## 2020-01-31 LAB — BASIC METABOLIC PANEL
BUN: 31 mg/dL — ABNORMAL HIGH (ref 6–23)
CO2: 27 mEq/L (ref 19–32)
Calcium: 9.5 mg/dL (ref 8.4–10.5)
Chloride: 100 mEq/L (ref 96–112)
Creatinine, Ser: 1.89 mg/dL — ABNORMAL HIGH (ref 0.40–1.50)
GFR: 47.73 mL/min — ABNORMAL LOW (ref >60.00)
Glucose, Bld: 108 mg/dL — ABNORMAL HIGH (ref 70–99)
Potassium: 3.6 mEq/L (ref 3.5–5.1)
Sodium: 137 mEq/L (ref 135–145)

## 2020-01-31 LAB — TSH: TSH: 0.75 u[IU]/mL (ref 0.35–4.50)

## 2020-01-31 LAB — PSA: PSA: 0.23 ng/mL (ref 0.10–4.00)

## 2020-01-31 MED ORDER — METOPROLOL SUCCINATE ER 50 MG PO TB24: 50.0000 mg | ORAL_TABLET | Freq: Every day | ORAL | 3 refills | Status: DC

## 2020-01-31 NOTE — Patient Instructions (Addendum)
Please schedule a routine vision and dental exams.  Please work on healthy diet, exercise and weight loss.  Start toprol xl once daily for blood pressure.    Preventive Care 63-42 Years Old, Male Preventive care refers to lifestyle choices and visits with your health care provider that can promote health and wellness. This includes:  A yearly physical exam. This is also called an annual well check.  Regular dental and eye exams.  Immunizations.  Screening for certain conditions.  Healthy lifestyle choices, such as eating a healthy diet, getting regular exercise, not using drugs or products that contain nicotine and tobacco, and limiting alcohol use. What can I expect for my preventive care visit? Physical exam Your health care provider will check:  Height and weight. These may be used to calculate body mass index (BMI), which is a measurement that tells if you are at a healthy weight.  Heart rate and blood pressure.  Your skin for abnormal spots. Counseling Your health care provider may ask you questions about:  Alcohol, tobacco, and drug use.  Emotional well-being.  Home and relationship well-being.  Sexual activity.  Eating habits.  Work and work Statistician. What immunizations do I need?  Influenza (flu) vaccine  This is recommended every year. Tetanus, diphtheria, and pertussis (Tdap) vaccine  You may need a Td booster every 10 years. Varicella (chickenpox) vaccine  You may need this vaccine if you have not already been vaccinated. Zoster (shingles) vaccine  You may need this after age 42. Measles, mumps, and rubella (MMR) vaccine  You may need at least one dose of MMR if you were born in 1957 or later. You may also need a second dose. Pneumococcal conjugate (PCV13) vaccine  You may need this if you have certain conditions and were not previously vaccinated. Pneumococcal polysaccharide (PPSV23) vaccine  You may need one or two doses if you smoke  cigarettes or if you have certain conditions. Meningococcal conjugate (MenACWY) vaccine  You may need this if you have certain conditions. Hepatitis A vaccine  You may need this if you have certain conditions or if you travel or work in places where you may be exposed to hepatitis A. Hepatitis B vaccine  You may need this if you have certain conditions or if you travel or work in places where you may be exposed to hepatitis B. Haemophilus influenzae type b (Hib) vaccine  You may need this if you have certain risk factors. Human papillomavirus (HPV) vaccine  If recommended by your health care provider, you may need three doses over 6 months. You may receive vaccines as individual doses or as more than one vaccine together in one shot (combination vaccines). Talk with your health care provider about the risks and benefits of combination vaccines. What tests do I need? Blood tests  Lipid and cholesterol levels. These may be checked every 5 years, or more frequently if you are over 42 years old.  Hepatitis C test.  Hepatitis B test. Screening  Lung cancer screening. You may have this screening every year starting at age 42 if you have a 30-pack-year history of smoking and currently smoke or have quit within the past 15 years.  Prostate cancer screening. Recommendations will vary depending on your family history and other risks.  Colorectal cancer screening. All adults should have this screening starting at age 42 and continuing until age 42. Your health care provider may recommend screening at age 42 if you are at increased risk. You will have tests  every 1-10 years, depending on your results and the type of screening test.  Diabetes screening. This is done by checking your blood sugar (glucose) after you have not eaten for a while (fasting). You may have this done every 1-3 years.  Sexually transmitted disease (STD) testing. Follow these instructions at home: Eating and  drinking  Eat a diet that includes fresh fruits and vegetables, whole grains, lean protein, and low-fat dairy products.  Take vitamin and mineral supplements as recommended by your health care provider.  Do not drink alcohol if your health care provider tells you not to drink.  If you drink alcohol: ? Limit how much you have to 0-2 drinks a day. ? Be aware of how much alcohol is in your drink. In the [place] one drink equals one 12 oz bottle of beer (355 mL), one 5 oz glass of wine (148 mL), or one 1 oz glass of hard liquor (44 mL). Lifestyle  Take daily care of your teeth and gums.  Stay active. Exercise for at least 30 minutes on 5 or more days each week.  Do not use any products that contain nicotine or tobacco, such as cigarettes, e-cigarettes, and chewing tobacco. If you need help quitting, ask your health care provider.  If you are sexually active, practice safe sex. Use a condom or other form of protection to prevent STIs (sexually transmitted infections).  Talk with your health care provider about taking a low-dose aspirin every day starting at age 42. What's next?  Go to your health care provider once a year for a well check visit.  Ask your health care provider how often you should have your eyes and teeth checked.  Stay up to date on all vaccines. This information is not intended to replace advice given to you by your health care provider. Make sure you discuss any questions you have with your health care provider. Document Revised: 11/01/2018 Document Reviewed: 11/01/2018 Elsevier Patient Education  2020 [employer].

## 2020-01-31 NOTE — Progress Notes (Addendum)
Subjective:    Patient ID: Taylor Herrera, male    DOB: 1976/05/30, 44 y.o.   MRN: [id number]  HPI  Patient presents today for complete physical.  Immunizations:  Plans to get a covid vaccine. tdap up to date Diet: not eating healthy Wt Readings from Last 3 Encounters:  01/31/20 261 lb (118.4 kg)  01/02/19 241 lb 1.9 oz (109.4 kg)  12/24/18 240 lb (108.9 kg)  Exercise:  Starting to exercise again- walking Vision:  Due Dental:  Due Reports that his evening bp reading have been as high as 169.  In the AM bp usually 135-140/80.  BP Readings from Last 3 Encounters:  01/31/20 (!) 149/82  12/24/18 116/64  08/22/18 134/73   + snoring.  Wakes up feeling rested. No witnessed apneas.    Review of Systems  Constitutional: Negative for unexpected weight change.  HENT: Negative for hearing loss and rhinorrhea.   Eyes: Negative for visual disturbance.  Respiratory: Negative for cough and shortness of breath.   Cardiovascular: Negative for chest pain.  Gastrointestinal: Negative for constipation.  Genitourinary: Negative for dysuria, frequency and hematuria.  Musculoskeletal: Negative for arthralgias and myalgias.  Skin: Negative for rash.  Neurological: Negative for headaches.  Hematological: Negative for adenopathy.  Psychiatric/Behavioral:       Denies depression anxiety   Past Medical History:  Diagnosis Date  . Essential hypertension 09/16/2014  . History of alcohol abuse 08/01/2018  . History of kidney stones   . HTN (hypertension) 04/23/2015  . Hypertension   . Migraine   . Preventative health care 04/23/2015     Social History   Socioeconomic History  . Marital status: Married    Spouse name: Not on file  . Number of children: Not on file  . Years of education: Not on file  . Highest education level: Not on file  Occupational History  . Not on file  Tobacco Use  . Smoking status: Never Smoker  . Smokeless tobacco: Never Used  Substance and Sexual Activity  .  Alcohol use: No    Comment: occasional  . Drug use: No  . Sexual activity: Not on file  Other Topics Concern  . 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 Difficulty of Paying Living Expenses:   Food Insecurity:   . Worried About Charity fundraiser in the Last Year:   . Arboriculturist in the Last Year:   Transportation Needs:   . Film/video editor (Medical):   Marland Kitchen Lack of Transportation (Non-Medical):   Physical Activity:   . Days of Exercise per Week:   . Minutes of Exercise per Session:   Stress:   . Feeling of Stress :   Social Connections:   . Frequency of Communication with Friends and Family:   . Frequency of Social Gatherings with Friends and Family:   . Attends Religious Services:   . Active Member of Clubs or Organizations:   . Attends Archivist Meetings:   Marland Kitchen Marital Status:   Intimate Partner Violence:   . Fear of Current or Ex-Partner:   . Emotionally Abused:   Marland Kitchen Physically Abused:   . Sexually Abused:     No past surgical history on  file.  Family History  Problem Relation Age of Onset  . Cancer Mother        multiple myeloma, ovarian  . Hypertension Mother   . Hypertension Father   . Arrhythmia Father   . Kidney disease Father        ESRD on HD    No Known Allergies  Current Outpatient Medications on File Prior to Visit  Medication Sig Dispense Refill  . eplerenone (INSPRA) 25 MG tablet Take 1 tablet (25 mg total) by mouth daily. 30 tablet 1  . Olmesartan-amLODIPine-HCTZ 40-10-12.5 MG TABS Take 1 tablet by mouth daily. 90 tablet 1   No current facility-administered medications on file prior to visit.    BP (!) 149/82 (BP Location: Right Arm, Patient Position: Sitting, Cuff Size: Large)   Pulse (!) 105    Temp (!) 97.1 F (36.2 C) (Temporal)   Resp 16   Ht 6' 2"  (1.88 m)   Wt 261 lb (118.4 kg)   SpO2 97%   BMI 33.51 kg/m       Objective:   Physical Exam  Physical Exam  Constitutional: He is oriented to person, place, and time. He appears well-developed and well-nourished. No distress.  HENT:  Head: Normocephalic and atraumatic.  Right Ear: Tympanic membrane and ear canal normal.  Left Ear: Tympanic membrane and ear canal normal.  Mouth/Throat: Not examined- pt wearing mask. Eyes: Pupils are equal, round, and reactive to light. No scleral icterus.  Neck: Normal range of motion. No thyromegaly present.  Cardiovascular: Normal rate and regular rhythm.   No murmur heard. Pulmonary/Chest: Effort normal and breath sounds normal. No respiratory distress. He has no wheezes. He has no rales. He exhibits no tenderness.  Abdominal: Soft. Bowel sounds are normal. He exhibits no distension and no mass. There is no tenderness. There is no rebound and no guarding.  Musculoskeletal: He exhibits no edema.  Lymphadenopathy:    He has no cervical adenopathy.  Neurological: He is alert and oriented to person, place, and time. He has normal patellar reflexes. He exhibits normal muscle tone. Coordination normal.  Skin: Skin is warm and dry.  Psychiatric: He has a normal mood and affect. His behavior is normal. Judgment and thought content normal.           Assessment & Plan:   Preventative- he has has gained 20 pounds this year. We discussed the importance of healthy diet, exercise and weight loss.  Will obtain routine lab work including PSA.   HTN- above goal. Will add toprol xl. Follow up in 2 weeks.   This visit occurred during the SARS-CoV-2 public health emergency.  Safety protocols were in place, including screening questions prior to the visit, additional usage of staff PPE, and extensive cleaning of exam room while observing appropriate contact time as indicated for disinfecting  solutions.          Assessment & Plan:

## 2020-02-17 ENCOUNTER — Encounter: Payer: Self-pay | Admitting: Family

## 2020-02-17 ENCOUNTER — Other Ambulatory Visit: Payer: Self-pay

## 2020-02-17 ENCOUNTER — Ambulatory Visit (INDEPENDENT_AMBULATORY_CARE_PROVIDER_SITE_OTHER): Payer: BLUE CROSS/BLUE SHIELD | Admitting: Family

## 2020-02-17 DIAGNOSIS — I10 Essential (primary) hypertension: Secondary | ICD-10-CM

## 2020-02-17 NOTE — Progress Notes (Signed)
Virtual Visit via Video Note  I connected with Taylor Herrera on 02/17/20 at  7:00 AM EDT  the correct person using two identifiers. We were unable to use video due to audio failure, therefore we transitioned to telephone.   Location: Patient: home Provider: work   I discussed the limitations of evaluation and management by telemedicine and the availability of in person appointments. The patient expressed understanding and agreed to proceed.  History of Present Illness:  Patient is a 44 yr old male who presents today for follow up.  Last visit bp was noted to be elevated and we added toprol xl once daily. Denies side effects.  130/70 this AM reports max bp 150/90-95 in the evenings.  HR 80  BP Readings from Last 3 Encounters:  01/31/20 (!) 149/82  12/24/18 116/64  08/22/18 134/73     Observations/Objective:   Gen: Awake, alert, no acute distress Resp: Breathing is even and non-labored Psych: calm/pleasant demeanor Neuro: Alert and Oriented x 3, + facial symmetry, speech is clear.   Assessment and Plan:  HTN- improving. Continue current blood pressure is improving.  Continue current meds/doses. I have asked him to check blood pressure 1-2 x daily for the next week and then send me his readings via mychart for review. Follow up in person in 3 months.    Follow Up Instructions:    I discussed the assessment and treatment plan with the patient. The patient was provided an opportunity to ask questions and all were answered. The patient agreed with the plan and demonstrated an understanding of the instructions.   The patient was advised to call back or seek an in-person evaluation if the symptoms worsen or if the condition fails to improve as anticipated.  Lemont Fillers, NP

## 2020-02-26 ENCOUNTER — Encounter: Payer: Self-pay | Admitting: Family

## 2020-02-26 MED ORDER — OLMESARTAN-AMLODIPINE-HCTZ 40-10-12.5 MG PO TABS: 1.0000 | ORAL_TABLET | Freq: Every day | ORAL | 1 refills | Status: DC

## 2020-04-09 ENCOUNTER — Encounter: Payer: Self-pay | Admitting: Family

## 2020-04-09 MED ORDER — METOPROLOL SUCCINATE ER 50 MG PO TB24: 75.0000 mg | ORAL_TABLET | Freq: Every day | ORAL | 3 refills | Status: DC

## 2020-04-24 ENCOUNTER — Other Ambulatory Visit: Payer: Self-pay | Admitting: Family

## 2020-05-11 ENCOUNTER — Ambulatory Visit: Payer: BLUE CROSS/BLUE SHIELD | Admitting: Family

## 2020-05-18 ENCOUNTER — Encounter: Payer: Self-pay | Admitting: Family

## 2020-05-18 ENCOUNTER — Ambulatory Visit (INDEPENDENT_AMBULATORY_CARE_PROVIDER_SITE_OTHER): Payer: 59 | Admitting: Family

## 2020-05-18 ENCOUNTER — Other Ambulatory Visit: Payer: Self-pay

## 2020-05-18 VITALS — BP 176/120 | HR 74 | Resp 18 | Ht 74.0 in | Wt 254.0 lb

## 2020-05-18 DIAGNOSIS — I10 Essential (primary) hypertension: Secondary | ICD-10-CM | POA: Diagnosis not present

## 2020-05-18 DIAGNOSIS — R0683 Snoring: Secondary | ICD-10-CM

## 2020-05-18 MED ORDER — CLONIDINE HCL 0.1 MG PO TABS: 0.1000 mg | ORAL_TABLET | Freq: Two times a day (BID) | ORAL | 5 refills | Status: DC

## 2020-05-18 MED ORDER — METOPROLOL SUCCINATE ER 50 MG PO TB24: 75.0000 mg | ORAL_TABLET | Freq: Every day | ORAL | 1 refills | Status: DC

## 2020-05-18 NOTE — Patient Instructions (Signed)
Please add clonidine 0.1mg twice daily.  

## 2020-05-18 NOTE — Progress Notes (Signed)
Subjective:    Patient ID: Taylor Herrera, male    DOB: 27-Nov-1975, 44 y.o.   MRN: [id number]  HPI  Patient is a 44 yr old male who presents today for follow up.  HTN-he brings with him today his home blood pressure readings.  All of his home blood pressure readings have a systolic pressure greater than 90.  He reports good compliance with his medications.  He reports improvement in his leg swelling.  BP Readings from Last 3 Encounters:  05/18/20 (!) 176/120  01/31/20 (!) 149/82  12/24/18 116/64   He is also interested in obtaining a sleep study for further evaluation.  His father had a history of sleep apnea.  The patient notes + daytime somnolence.  + witnessed apneas,+ snoring.   Review of Systems    see HPI  Past Medical History:  Diagnosis Date  . Essential hypertension 09/16/2014  . History of alcohol abuse 08/01/2018  . History of kidney stones   . HTN (hypertension) 04/23/2015  . Hypertension   . Migraine   . Preventative health care 04/23/2015     Social History   Socioeconomic History  . Marital status: Married    Spouse name: Not on file  . Number of children: Not on file  . Years of education: Not on file  . Highest education level: Not on file  Occupational History  . Not on file  Tobacco Use  . Smoking status: Never Smoker  . Smokeless tobacco: Never Used  Substance and Sexual Activity  . Alcohol use: No    Comment: occasional  . Drug use: No  . Sexual activity: Not on file  Other Topics Concern  . Not on file  Social History Narrative   Driver for [employer]   Married- two kids (one son and one daughter) both healthy   Originally from [place]   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   Completed associates degree   Social Determinants of Health   Financial Resource Strain:   . Difficulty of Paying Living Expenses:   Food Insecurity:   . Worried About Charity fundraiser in the Last Year:   . Arboriculturist in the Last Year:   Transportation  Needs:   . Film/video editor (Medical):   Marland Kitchen Lack of Transportation (Non-Medical):   Physical Activity:   . Days of Exercise per Week:   . Minutes of Exercise per Session:   Stress:   . Feeling of Stress :   Social Connections:   . Frequency of Communication with Friends and Family:   . Frequency of Social Gatherings with Friends and Family:   . Attends Religious Services:   . Active Member of Clubs or Organizations:   . Attends Archivist Meetings:   Marland Kitchen Marital Status:   Intimate Partner Violence:   . Fear of Current or Ex-Partner:   . Emotionally Abused:   Marland Kitchen Physically Abused:   . Sexually Abused:     No past surgical history on file.  Family History  Problem Relation Age of Onset  . Cancer Mother        multiple myeloma, ovarian  . Hypertension Mother   . Hypertension Father   . Arrhythmia Father   . Kidney disease Father        ESRD on HD  . Hypertension Sister     No Known Allergies  Current Outpatient Medications on File Prior to Visit  Medication Sig Dispense Refill  . eplerenone (  INSPRA) 25 MG tablet Take 1 tablet (25 mg total) by mouth daily. 30 tablet 1  . metoprolol succinate (TOPROL-XL) 50 MG 24 hr tablet TAKE 1 TABLET (50 MG TOTAL) BY MOUTH DAILY. TAKE WITH OR IMMEDIATELY FOLLOWING A MEAL. 90 tablet 1  . Olmesartan-amLODIPine-HCTZ 40-10-12.5 MG TABS Take 1 tablet by mouth daily. 90 tablet 1   No current facility-administered medications on file prior to visit.    BP (!) 176/120 (BP Location: Right Arm, Patient Position: Sitting, Cuff Size: Large)   Pulse 74   Resp 18   Ht _0  (1.88 m)   Wt 254 lb (115.2 kg)   SpO2 99%   BMI 32.61 kg/m    Objective:   Physical Exam Constitutional:      General: He is not in acute distress.    Appearance: He is well-developed.  HENT:     Head: Normocephalic and atraumatic.  Cardiovascular:     Rate and Rhythm: Normal rate and regular rhythm.     Heart sounds: No murmur heard.   Pulmonary:       Effort: Pulmonary effort is normal. No respiratory distress.     Breath sounds: Normal breath sounds. No wheezing or rales.  Musculoskeletal:     Right lower leg: No edema.     Left lower leg: No edema.  Skin:    General: Skin is warm and dry.  Neurological:     Mental Status: He is alert and oriented to person, place, and time.  Psychiatric:        Behavior: Behavior normal.        Thought Content: Thought content normal.           Assessment & Plan:  Hypertension-uncontrolled.  Last visit we increased his Toprol-XL from 50 mg to 75 mg once daily.  We will add clonidine 0.1 mg twice daily.  Snoring-will refer to sleep specialist for sleep apnea evaluation.  This visit occurred during the SARS-CoV-2 public health emergency.  Safety protocols were in place, including screening questions prior to the visit, additional usage of staff PPE, and extensive cleaning of exam room while observing appropriate contact time as indicated for disinfecting solutions.

## 2020-06-09 ENCOUNTER — Ambulatory Visit: Payer: 59 | Admitting: Family

## 2020-06-15 ENCOUNTER — Other Ambulatory Visit: Payer: Self-pay

## 2020-06-15 ENCOUNTER — Encounter: Payer: Self-pay | Admitting: Family

## 2020-06-15 ENCOUNTER — Ambulatory Visit (INDEPENDENT_AMBULATORY_CARE_PROVIDER_SITE_OTHER): Payer: 59 | Admitting: Family

## 2020-06-15 VITALS — BP 169/113 | HR 76 | Temp 98.3°F | Resp 16 | Ht 74.0 in | Wt 245.0 lb

## 2020-06-15 DIAGNOSIS — I10 Essential (primary) hypertension: Secondary | ICD-10-CM | POA: Diagnosis not present

## 2020-06-15 MED ORDER — METOPROLOL SUCCINATE ER 100 MG PO TB24: 100.0000 mg | ORAL_TABLET | Freq: Every day | ORAL | 1 refills | Status: DC

## 2020-06-15 MED ORDER — OLMESARTAN-AMLODIPINE-HCTZ 40-10-25 MG PO TABS: 1.0000 | ORAL_TABLET | Freq: Every day | ORAL | 1 refills | Status: DC

## 2020-06-15 NOTE — Progress Notes (Signed)
Subjective:    Patient ID: Taylor Herrera, male    DOB: [birth year], 44 y.o.   MRN: [id number]  HPI  Patient is a 44 yr old male who presents today for follow up of his blood pressure. Last visit we added clonidine 0.9m bid.  He is maintained on tribenzor  BP Readings from Last 3 Encounters:  06/15/20 (!) 169/113  05/18/20 (!) 176/120  01/31/20 (!) 149/82   Home BP readings are as follows:   SBP 125-170/ DBP 90-110     Review of Systems See HPI  Past Medical History:  Diagnosis Date  . Essential hypertension 09/16/2014  . History of alcohol abuse 08/01/2018  . History of kidney stones   . HTN (hypertension) 04/23/2015  . Hypertension   . Migraine   . Preventative health care 04/23/2015     Social History   Socioeconomic History  . Marital status: Married    Spouse name: Not on file  . Number of children: Not on file  . Years of education: Not on file  . Highest education level: Not on file  Occupational History  . Not on file  Tobacco Use  . Smoking status: Never Smoker  . Smokeless tobacco: Never Used  Substance and Sexual Activity  . Alcohol use: No    Comment: occasional  . Drug use: No  . Sexual activity: Not on file  Other Topics Concern  . 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 Difficulty of Paying Living Expenses:   Food Insecurity:   . Worried About RCharity fundraiserin the Last Year:   . RArboriculturistin the Last Year:   Transportation Needs:   . LFilm/video editor(Medical):   .Marland KitchenLack of Transportation (Non-Medical):   Physical Activity:   . Days of Exercise per Week:   . Minutes of Exercise per Session:   Stress:   . Feeling of Stress :   Social Connections:   . Frequency of Communication  with Friends and Family:   . Frequency of Social Gatherings with Friends and Family:   . Attends Religious Services:   . Active Member of Clubs or Organizations:   . Attends CArchivistMeetings:   .Marland KitchenMarital Status:   Intimate Partner Violence:   . Fear of Current or Ex-Partner:   . Emotionally Abused:   .Marland KitchenPhysically Abused:   . Sexually Abused:     No past surgical history on file.  Family History  Problem Relation Age of Onset  . Cancer Mother        multiple myeloma, ovarian  . Hypertension Mother   . Hypertension Father   . Arrhythmia Father   . Kidney disease Father        ESRD on HD  . Hypertension Sister     No Known Allergies  Current Outpatient Medications on File Prior to Visit  Medication Sig Dispense Refill  . cloNIDine (CATAPRES) 0.1 MG tablet Take 1 tablet (0.1 mg total) by mouth 2 (two) times daily. 60 tablet 5  . eplerenone (INSPRA) 25 MG tablet Take 1 tablet (25 mg total) by mouth daily. 30 tablet 1  . metoprolol succinate (TOPROL XL) 50 MG  24 hr tablet Take 1.5 tablets (75 mg total) by mouth daily. Take with or immediately following a meal. 135 tablet 1  . Olmesartan-amLODIPine-HCTZ 40-10-12.5 MG TABS Take 1 tablet by mouth daily. 90 tablet 1   No current facility-administered medications on file prior to visit.    BP (!) 169/113 (BP Location: Right Arm, Patient Position: Sitting, Cuff Size: Large)   Pulse 76   Temp 98.3 F (36.8 C) (Oral)   Resp 16   Ht _0  (1.88 m)   Wt (!) 245 lb (111.1 kg)   SpO2 99%   BMI 31.46 kg/m       Objective:   Physical Exam Constitutional:      General: He is not in acute distress.    Appearance: He is well-developed.  HENT:     Head: Normocephalic and atraumatic.  Cardiovascular:     Rate and Rhythm: Normal rate and regular rhythm.     Heart sounds: No murmur heard.   Pulmonary:     Effort: Pulmonary effort is normal. No respiratory distress.     Breath sounds: Normal breath sounds. No  wheezing or rales.  Skin:    General: Skin is warm and dry.  Neurological:     Mental Status: He is alert and oriented to person, place, and time.  Psychiatric:        Behavior: Behavior normal.        Thought Content: Thought content normal.           Assessment & Plan:  HTN- uncontrolled. Will change tribenzor from 40/10/12.5 to 40/10/25mg and increase toprol xl from 61m to 108m He has not heard back from pulmonology re:  Consult for sleep study.  I gave him the number to call.  This visit occurred during the SARS-CoV-2 public health emergency.  Safety protocols were in place, including screening questions prior to the visit, additional usage of staff PPE, and extensive cleaning of exam room while observing appropriate contact time as indicated for disinfecting solutions.

## 2020-06-15 NOTE — Patient Instructions (Addendum)
Please change tribenzor to new dose 40/10/25 Increase toprol xl to 100mg   Once daily. Please send blood pressure reading and heart rate in 1 week.

## 2020-06-29 ENCOUNTER — Other Ambulatory Visit (INDEPENDENT_AMBULATORY_CARE_PROVIDER_SITE_OTHER): Payer: 59

## 2020-06-29 ENCOUNTER — Other Ambulatory Visit: Payer: Self-pay

## 2020-06-29 DIAGNOSIS — I10 Essential (primary) hypertension: Secondary | ICD-10-CM | POA: Diagnosis not present

## 2020-06-29 LAB — BASIC METABOLIC PANEL
BUN: 17 mg/dL (ref 6–23)
CO2: 23 mEq/L (ref 19–32)
Calcium: 9.7 mg/dL (ref 8.4–10.5)
Chloride: 101 mEq/L (ref 96–112)
Creatinine, Ser: 1.47 mg/dL (ref 0.40–1.50)
GFR: 63.66 mL/min (ref >60.00)
Glucose, Bld: 102 mg/dL — ABNORMAL HIGH (ref 70–99)
Potassium: 3.6 mEq/L (ref 3.5–5.1)
Sodium: 137 mEq/L (ref 135–145)

## 2020-08-03 ENCOUNTER — Ambulatory Visit: Payer: 59 | Admitting: Family

## 2020-08-03 DIAGNOSIS — Z0289 Encounter for other administrative examinations: Secondary | ICD-10-CM

## 2020-08-14 ENCOUNTER — Other Ambulatory Visit: Payer: Self-pay

## 2020-08-14 ENCOUNTER — Encounter: Payer: Self-pay | Admitting: Pulmonary Disease

## 2020-08-14 ENCOUNTER — Ambulatory Visit (INDEPENDENT_AMBULATORY_CARE_PROVIDER_SITE_OTHER): Payer: 59 | Admitting: Pulmonary Disease

## 2020-08-14 DIAGNOSIS — G4733 Obstructive sleep apnea (adult) (pediatric): Secondary | ICD-10-CM

## 2020-08-14 DIAGNOSIS — I10 Essential (primary) hypertension: Secondary | ICD-10-CM | POA: Diagnosis not present

## 2020-08-14 NOTE — Addendum Note (Signed)
Addended by: Maurene Capes on: 08/14/2020 02:26 PM   Modules accepted: Orders

## 2020-08-14 NOTE — Assessment & Plan Note (Signed)
Given excessive daytime somnolence, narrow pharyngeal exam, witnessed apneas & loud snoring, obstructive sleep apnea is probable & an overnight polysomnogram will be scheduled as a home study. The pathophysiology of obstructive sleep apnea , it's cardiovascular consequences & modes of treatment including CPAP were discused with the patient in detail & they evidenced understanding.  Pretest probability is intermediate, he would be willing to use a CPAP if needed.  Red flags include difficulty treat hypertension, loud snoring.

## 2020-08-14 NOTE — Assessment & Plan Note (Signed)
Requiring 3-4 meds, well controlled today. Weight loss of 20 pounds encouraged

## 2020-08-14 NOTE — Progress Notes (Signed)
Subjective:    Patient ID: Taylor Herrera, male    DOB: 1976-10-27, 44 y.o.   MRN: [id number]  HPI   Chief Complaint  Patient presents with   Consult    Referred by PCP for history for snoring. Denies ever having a SS before. Spouse reports that he snores at night.    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 Wife reports loud snoring for many years, short sleep.  This.  He reports nonrefreshing sleep. Epworth sleepiness score is 18 reports sleepiness as a passenger in a car lying down to rest in the afternoon when circumstances permit.  He drives in the afternoons mostly from 3 PM sometimes as late in 2 AM for the last 4 to 5 years. Bedtime can be as late as 4 AM, sleep latency is about 30 minutes, he sleeps on his side with 1 pillow, reports 3-4 awakenings including nocturia and is out of bed by noon feeling a little tired with dryness of mouth but denies headaches.  When he is on the road, sometimes he is able to take power naps for 20 to 30 minutes and such a nap is refreshing.  On his days off he sleeps intermittently between 8 PM and 8 AM. He has gained 5 pounds over the last 2 years. He has hypertension requiring 3-4 medications to control for the last 5 years  History is also obtained from his wife.  He has 2 sons, youngest is 31 months old  Family history-hypertension and A. fib and dad, hypertension and myeloma in mom   Past Medical History:  Diagnosis Date   Essential hypertension 09/16/2014   History of alcohol abuse 08/01/2018   History of kidney stones    HTN (hypertension) 04/23/2015   Hypertension    Migraine    Preventative health care 04/23/2015   History reviewed. No pertinent surgical history.  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 (one son and one daughter) both healthy   Originally from [place]   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   Completed associates degree   Social Determinants of Health   Financial Resource Strain:    Difficulty of Paying Living Expenses: Not on file  Food Insecurity:    Worried About Charity fundraiser in the Last Year: Not on file   [employer] of Food in the Last Year: Not on file  Transportation Needs:    Lack of Transportation (Medical): Not on file   Lack of Transportation (Non-Medical): Not on file  Physical Activity:    Days of Exercise per Week: Not on file   Minutes of Exercise per Session: Not on file  Stress:    Feeling of Stress : Not on file  Social Connections:    Frequency of Communication with Friends and Family: Not on file  Frequency of Social Gatherings with Friends and Family: Not on file   Attends Religious Services: Not on file   Active Member of Clubs or Organizations: Not on file   Attends Archivist Meetings: Not on file   Marital Status: Not on file  Intimate Partner Violence:    Fear of Current or Ex-Partner: Not on file   Emotionally Abused: Not on file   Physically Abused: Not on file   Sexually Abused: Not on file    Family History  Problem Relation Age of Onset   Cancer Mother        multiple myeloma, ovarian   Hypertension Mother    Hypertension Father    Arrhythmia Father    Kidney disease Father        ESRD on HD   Hypertension Sister       Review of Systems Constitutional: negative for anorexia, fevers and sweats  Eyes: negative for irritation,  redness and visual disturbance  Ears, nose, mouth, throat, and face: negative for earaches, epistaxis, nasal congestion and sore throat  Respiratory: negative for cough, dyspnea on exertion, sputum and wheezing  Cardiovascular: negative for chest pain, dyspnea, lower extremity edema, orthopnea, palpitations and syncope  Gastrointestinal: negative for abdominal pain, constipation, diarrhea, melena, nausea and vomiting  Genitourinary:negative for dysuria, frequency and hematuria  Hematologic/lymphatic: negative for bleeding, easy bruising and lymphadenopathy  Musculoskeletal:negative for arthralgias, muscle weakness and stiff joints  Neurological: negative for coordination problems, gait problems, headaches and weakness  Endocrine: negative for diabetic symptoms including polydipsia, polyuria and weight loss     Objective:   Physical Exam  Gen. Pleasant, obese, in no distress, normal affect ENT - no pallor,icterus, no post nasal drip Neck: No JVD, no thyromegaly, no carotid bruits Lungs: no use of accessory muscles, no dullness to percussion, clear without rales or rhonchi  Cardiovascular: Rhythm regular, heart sounds  normal, no murmurs or gallops, no peripheral edema Abdomen: soft and non-tender, no hepatosplenomegaly, BS normal. Musculoskeletal: No deformities, no cyanosis or clubbing Neuro:  alert, non focal       Assessment & Plan:

## 2020-08-14 NOTE — Patient Instructions (Signed)
  Schedule home sleep test We discussed treatment options 

## 2020-11-06 ENCOUNTER — Other Ambulatory Visit: Payer: Self-pay | Admitting: Family

## 2020-11-06 MED ORDER — METOPROLOL SUCCINATE ER 100 MG PO TB24: 100.0000 mg | ORAL_TABLET | Freq: Every day | ORAL | 0 refills | Status: DC

## 2020-11-09 ENCOUNTER — Other Ambulatory Visit: Payer: Self-pay | Admitting: Family

## 2020-11-23 ENCOUNTER — Other Ambulatory Visit: Payer: Self-pay | Admitting: Family

## 2020-12-11 ENCOUNTER — Encounter: Payer: Self-pay | Admitting: Pulmonary Disease

## 2022-02-04 ENCOUNTER — Ambulatory Visit (INDEPENDENT_AMBULATORY_CARE_PROVIDER_SITE_OTHER): Payer: BC Managed Care – PPO | Admitting: Family

## 2022-02-04 VITALS — BP 170/112 | HR 83 | Temp 98.4°F | Resp 16 | Ht 74.0 in | Wt 239.0 lb

## 2022-02-04 DIAGNOSIS — I10 Essential (primary) hypertension: Secondary | ICD-10-CM | POA: Diagnosis not present

## 2022-02-04 DIAGNOSIS — F32A Depression, unspecified: Secondary | ICD-10-CM | POA: Insufficient documentation

## 2022-02-04 DIAGNOSIS — G4733 Obstructive sleep apnea (adult) (pediatric): Secondary | ICD-10-CM | POA: Diagnosis not present

## 2022-02-04 MED ORDER — CLONIDINE HCL 0.1 MG PO TABS: 0.1000 mg | ORAL_TABLET | Freq: Two times a day (BID) | ORAL | 1 refills | Status: DC

## 2022-02-04 MED ORDER — OLMESARTAN-AMLODIPINE-HCTZ 40-10-25 MG PO TABS: 1.0000 | ORAL_TABLET | Freq: Every day | ORAL | 1 refills | Status: DC

## 2022-02-04 MED ORDER — METOPROLOL SUCCINATE ER 100 MG PO TB24: 100.0000 mg | ORAL_TABLET | Freq: Every day | ORAL | 1 refills | Status: DC

## 2022-02-04 NOTE — Progress Notes (Addendum)
? ?Subjective:  ? ?By signing my name below, I, Taylor Herrera, attest that this documentation has been prepared under the direction and in the presence of [name] NP, 02/04/2022   ? ? Patient ID: Taylor Herrera, male    DOB: Jun 29, 1976, 46 y.o.   MRN: [id number] ? ?Chief Complaint  ?Patient presents with  ? Hypertension  ?  Here for follow up  ? ? ?HPI ?Patient is in today for an office visit. ? ?Blood Pressure - As of today's visit his blood pressure reads 194/133 upon arrival. Repeat bp manually 180/115. He has not been taking 0.1 MG of Clonidine and 100 MG of Metoprolol succinate. His blood pressure readings have elevated. He has not been taking his blood pressure medications. He denies having headaches, chest pains and swelling.  ?BP Readings from Last 3 Encounters:  ?02/04/22 (!) 170/112  ?08/14/20 110/80  ?06/15/20 (!) 169/113  ? ? ?Health Maintenance Due  ?Topic Date Due  ? COVID-19 Vaccine (1) Never done  ? Hepatitis C Screening  Never done  ? INFLUENZA VACCINE  Never done  ? ? ?Past Medical History:  ?Diagnosis Date  ? Essential hypertension 09/16/2014  ? History of alcohol abuse 08/01/2018  ? History of kidney stones   ? HTN (hypertension) 04/23/2015  ? Hypertension   ? Migraine   ? Preventative health care 04/23/2015  ? ? ?No past surgical history on file. ? ?Family History  ?Problem Relation Age of Onset  ? Cancer Mother   ?     multiple myeloma, ovarian  ? Hypertension Mother   ? Hypertension Father   ? Arrhythmia Father   ? Kidney disease Father   ?     ESRD on HD  ? Hypertension Sister   ? ? ?Social History  ? ?Socioeconomic History  ? Marital status: Married  ?  Spouse name: Not on file  ? Number of children: Not on file  ? Years of education: Not on file  ? Highest education level: Not on file  ?Occupational History  ? Not on file  ?Tobacco Use  ? Smoking status: Never  ? Smokeless tobacco: Never  ?Substance and Sexual Activity  ? Alcohol use: No  ?  Comment: occasional  ? Drug use: No  ?  Sexual activity: Not on file  ?Other Topics Concern  ? Not on file  ?Social History Narrative  ? Driver for [employer]  ? 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 Originally from [place]  ? 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 Completed associates degree  ? ?Social Determinants of Health  ? ?Financial Resource Strain: Not on file  ?Food Insecurity: Not on file  ?Transportation Needs: Not on file  ?Physical Activity: Not on file  ?Stress: Not on file  ?Social Connections: Not on file  ?Intimate Partner Violence: Not on file  ? ? ?Outpatient Medications Prior to Visit  ?Medication Sig Dispense Refill  ? cloNIDine (CATAPRES) 0.1 MG tablet Take 1 tablet (0.1 mg total) by mouth 2 (two) times daily. 60 tablet 0  ? metoprolol succinate (TOPROL-XL) 100 MG 24 hr tablet Take 1 tablet (100 mg total) by mouth daily. Take with or immediately following a meal. 30 tablet 0  ? Olmesartan-amLODIPine-HCTZ 40-10-25 MG TABS Take 1 tablet by mouth daily. 90 tablet 1  ? ?No facility-administered medications prior to visit.  ? ? ?No Known Allergies ? ?Review of Systems  ?Cardiovascular:  Negative for chest pain and leg  swelling.  ?Neurological:  Negative for headaches.  ? ?   ?Objective:  ?  ?Physical Exam ?Constitutional:   ?   General: He is not in acute distress. ?   Appearance: Normal appearance. He is not ill-appearing.  ?HENT:  ?   Head: Normocephalic and atraumatic.  ?   Right Ear: External ear normal.  ?   Left Ear: External ear normal.  ?Eyes:  ?   Extraocular Movements: Extraocular movements intact.  ?   Pupils: Pupils are equal, round, and reactive to light.  ?Cardiovascular:  ?   Rate and Rhythm: Normal rate and regular rhythm.  ?   Heart sounds: Normal heart sounds. No murmur heard. ?  No gallop.  ?Pulmonary:  ?   Effort: Pulmonary effort is normal. No respiratory distress.  ?   Breath sounds: Normal breath sounds. No wheezing or rales.  ?Skin: ?   General: Skin is warm and dry.  ?Neurological:  ?   Mental  Status: He is alert and oriented to person, place, and time.  ?Psychiatric:     ?   Mood and Affect: Mood normal.     ?   Behavior: Behavior normal.     ?   Judgment: Judgment normal.  ? ? ?BP (!) 170/112 (BP Location: Left Arm, Patient Position: Sitting, Cuff Size: Large)   Pulse 83   Temp 98.4 ?F (36.9 ?C) (Oral)   Resp 16   Ht 6' 2"  (1.88 m)   Wt 239 lb (108.4 kg)   SpO2 100%   BMI 30.69 kg/m?  ?Wt Readings from Last 3 Encounters:  ?02/04/22 239 lb (108.4 kg)  ?08/14/20 241 lb (109.3 kg)  ?06/15/20 (!) 245 lb (111.1 kg)  ? ? ?   ?Assessment & Plan:  ? ?Problem List Items Addressed This Visit   ? ?  ? Unprioritized  ? OSA (obstructive sleep apnea)  ?  Requesting referral back to pulmonology for possible repeat sleep study.  ?  ?  ? Relevant Orders  ? Ambulatory referral to Pulmonology  ? Essential hypertension  ?  Uncontrolled. Initial SBP>200 upon arrival. Pt was given clonidine 0.13m PO x 1. Repeat bp 30 minutes later was 170/112.  Will obtain CMET to evaluate renal function.   ? ?Pt is advised as follows: ? ?Please go pick up your medications now and be sure to take your metoprolol and tribenzor ASAP.  ?Follow up in 1 week.  ? ?He asked for information on counseling and I gave [place] [facility]  ?  ?  ? Relevant Medications  ? cloNIDine (CATAPRES) 0.1 MG tablet  ? Olmesartan-amLODIPine-HCTZ 40-10-25 MG TABS  ? metoprolol succinate (TOPROL-XL) 100 MG 24 hr tablet  ? ?Other Visit Diagnoses   ? ? Primary hypertension    -  Primary  ? Relevant Medications  ? cloNIDine (CATAPRES) 0.1 MG tablet  ? Olmesartan-amLODIPine-HCTZ 40-10-25 MG TABS  ? metoprolol succinate (TOPROL-XL) 100 MG 24 hr tablet  ? Other Relevant Orders  ? Comp Met (CMET)  ? ?  ? ? ? ? ?Meds ordered this encounter  ?Medications  ? cloNIDine (CATAPRES) 0.1 MG tablet  ?  Sig: Take 1 tablet (0.1 mg total) by mouth 2 (two) times daily.  ?  Dispense:  180 tablet  ?  Refill:  1  ?  Requested drug refills  are authorized, however, the patient needs further evaluation and/or laboratory testing before further refills are given. Ask him to  make an appointment for this.  ?  Order Specific Question:   Supervising Provider  ?  Answer:   Penni Homans A [[id number]]  ? Olmesartan-amLODIPine-HCTZ 40-10-25 MG TABS  ?  Sig: Take 1 tablet by mouth daily.  ?  Dispense:  90 tablet  ?  Refill:  1  ?  Order Specific Question:   Supervising Provider  ?  Answer:   Penni Homans A [[id number]]  ? metoprolol succinate (TOPROL-XL) 100 MG 24 hr tablet  ?  Sig: Take 1 tablet (100 mg total) by mouth daily. Take with or immediately following a meal.  ?  Dispense:  90 tablet  ?  Refill:  1  ?  Requested drug refills are authorized, however, the patient needs further evaluation and/or laboratory testing before further refills are given. Ask him to make an appointment for this.  ?  Order Specific Question:   Supervising Provider  ?  Answer:   Penni Homans A [[id number]]  ? ? ?I, Nance Pear, NP, personally preformed the services described in this documentation.  All medical record entries made by the scribe were at my direction and in my presence.  I have reviewed the chart and discharge instructions (if applicable) and agree that the record reflects my personal performance and is accurate and complete. 02/04/2022 ? ? ?I,Amber Collins,acting as a Education administrator for [employer], NP.,have documented all relevant documentation on the behalf of Nance Pear, NP,as directed by  Nance Pear, NP while in the presence of Nance Pear, NP. ? ? ? ?Nance Pear, NP ? ?

## 2022-02-04 NOTE — Assessment & Plan Note (Deleted)
Uncontrolled. Initial SBP>200 upon arrival. Pt was given clonidine 0.1mg  PO x 1. Repeat bp 30 minutes later was 170/112.  Will obtain CMET to evaluate renal function.   ? ?Pt is advised as follows: ? ?Please go pick up your medications now and be sure to take your metoprolol and tribenzor ASAP.  ?Follow up in 1 week.  ? ?He requested information on counseling and I gave him a pamphlet for [place].  ?

## 2022-02-04 NOTE — Assessment & Plan Note (Signed)
Uncontrolled. Initial SBP>200 upon arrival. Pt was given clonidine 0.1mg  PO x 1. Repeat bp 30 minutes later was 170/112.  Will obtain CMET to evaluate renal function.   ? ?Pt is advised as follows: ? ?Please go pick up your medications now and be sure to take your metoprolol and tribenzor ASAP.  ?Follow up in 1 week.  ? ?He asked for information on counseling and I gave him a pamphlet with information on [facility].  ?

## 2022-02-04 NOTE — Assessment & Plan Note (Signed)
Requesting referral back to pulmonology for possible repeat sleep study.  ?

## 2022-02-04 NOTE — Patient Instructions (Addendum)
Please complete lab work prior to leaving.  ?Please go pick up your medications now and be sure to take your metoprolol and tribenzor ASAP.  ?

## 2022-02-04 NOTE — Assessment & Plan Note (Signed)
Depression screen [facility] 2/9 02/04/2022 01/31/2020 08/01/2018 02/27/2017  ?Decreased Interest 1 0 0 1  ?Down, Depressed, Hopeless 2 0 0 1  ?PHQ - 2 Score 3 0 0 2  ?Altered sleeping 3 - 1 1  ?Tired, decreased energy 3 - 1 1  ?Change in appetite 2 - 0 0  ?Feeling bad or failure about yourself  1 - 0 0  ?Trouble concentrating 2 - 0 0  ?Moving slowly or fidgety/restless 0 - 0 0  ?Suicidal thoughts 0 - 0 0  ?PHQ-9 Score 14 - 2 4  ? ?New. Referred for counseling.  ? ?

## 2022-02-05 LAB — COMPREHENSIVE METABOLIC PANEL
AG Ratio: 1.1 (calc) (ref 1.0–2.5)
ALT: 19 U/L (ref 9–46)
AST: 19 U/L (ref 10–40)
Albumin: 4.1 g/dL (ref 3.6–5.1)
Alkaline phosphatase (APISO): 101 U/L (ref 36–130)
BUN/Creatinine Ratio: 14 (calc) (ref 6–22)
BUN: 37 mg/dL — ABNORMAL HIGH (ref 7–25)
CO2: 24 mmol/L (ref 20–32)
Calcium: 9.8 mg/dL (ref 8.6–10.3)
Chloride: 101 mmol/L (ref 98–110)
Creat: 2.59 mg/dL — ABNORMAL HIGH (ref 0.60–1.29)
Globulin: 3.6 g/dL (calc) (ref 1.9–3.7)
Glucose, Bld: 95 mg/dL (ref 65–99)
Potassium: 4.4 mmol/L (ref 3.5–5.3)
Sodium: 139 mmol/L (ref 135–146)
Total Bilirubin: 0.6 mg/dL (ref 0.2–1.2)
Total Protein: 7.7 g/dL (ref 6.1–8.1)

## 2022-02-06 ENCOUNTER — Telehealth: Payer: Self-pay | Admitting: Family

## 2022-02-06 DIAGNOSIS — N1832 Chronic kidney disease, stage 3b: Secondary | ICD-10-CM

## 2022-02-06 NOTE — Telephone Encounter (Signed)
See mychart.  

## 2022-02-14 ENCOUNTER — Ambulatory Visit: Payer: BC Managed Care – PPO | Admitting: Family

## 2022-02-14 ENCOUNTER — Ambulatory Visit (INDEPENDENT_AMBULATORY_CARE_PROVIDER_SITE_OTHER): Payer: BC Managed Care – PPO | Admitting: Family

## 2022-02-14 DIAGNOSIS — F32A Depression, unspecified: Secondary | ICD-10-CM | POA: Diagnosis not present

## 2022-02-14 DIAGNOSIS — I10 Essential (primary) hypertension: Secondary | ICD-10-CM | POA: Diagnosis not present

## 2022-02-14 MED ORDER — CARVEDILOL 25 MG PO TABS: 25.0000 mg | ORAL_TABLET | Freq: Two times a day (BID) | ORAL | 3 refills | Status: DC

## 2022-02-14 MED ORDER — ESCITALOPRAM OXALATE 10 MG PO TABS: ORAL_TABLET | ORAL | 0 refills | Status: DC

## 2022-02-14 NOTE — Progress Notes (Signed)
? ?Subjective:  ? ?By signing my name below, I, Shehryar Baig, attest that this documentation has been prepared under the direction and in the presence of [name], NP. 02/14/2022 ? ? ? Patient ID: Taylor Herrera, male    DOB: [birth year], 46 y.o.   MRN: [id number] ? ?Chief Complaint  ?Patient presents with  ? Hypertension  ? ? ?HPI ?Patient is in today for a office visit.  ? ?His blood pressure is elevated during this visit. It has improved since last visit. He is willing to switch metoprolol succinate daily PO to carvedilol to manage his blood pressure. He continues taking 0.1 mg clonidine 2x daily PO, 100 mg metoprolol succinate daily PO and reports no new issues while taking it. He denies having any headaches, chest pain, SOB at this time.  ?BP Readings from Last 3 Encounters:  ?02/14/22 (!) 154/99  ?02/04/22 (!) 170/112  ?08/14/20 110/80  ? ?Pulse Readings from Last 3 Encounters:  ?02/14/22 (!) 57  ?02/04/22 83  ?08/14/20 68  ? ?Stress- His stress has stabilized better since last visit. He reports his stress might have contributed to his blood pressure during last visit. He reports having trouble controlling his stress. He is interested in taking medication to help him manage his stress and depression. He has not taken medication to manage his depression in the past. He is also interested in counseling. He is controlling his alcohol intake and does not drink regularly.  ? ? ?Health Maintenance Due  ?Topic Date Due  ? COVID-19 Vaccine (1) Never done  ? Hepatitis C Screening  Never done  ? INFLUENZA VACCINE  Never done  ? ? ?Past Medical History:  ?Diagnosis Date  ? Essential hypertension 09/16/2014  ? History of alcohol abuse 08/01/2018  ? History of kidney stones   ? HTN (hypertension) 04/23/2015  ? Hypertension   ? Migraine   ? Preventative health care 04/23/2015  ? ? ?No past surgical history on file. ? ?Family History  ?Problem Relation Age of Onset  ? Cancer Mother   ?     multiple myeloma, ovarian  ?  Hypertension Mother   ? Hypertension Father   ? Arrhythmia Father   ? Kidney disease Father   ?     ESRD on HD  ? Hypertension Sister   ? ? ?Social History  ? ?Socioeconomic History  ? Marital status: Married  ?  Spouse name: Not on file  ? Number of children: Not on file  ? Years of education: Not on file  ? Highest education level: Not on file  ?Occupational History  ? Not on file  ?Tobacco Use  ? Smoking status: Never  ? Smokeless tobacco: Never  ?Substance and Sexual Activity  ? Alcohol use: No  ?  Comment: occasional  ? Drug use: No  ? Sexual activity: Not on file  ?Other Topics Concern  ? Not on file  ?Social History Narrative  ? Driver for [employer]  ? 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 Originally from [place]  ? 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 Completed associates degree  ? ?Social Determinants of Health  ? ?Financial Resource Strain: Not on file  ?Food Insecurity: Not on file  ?Transportation Needs: Not on file  ?Physical Activity: Not on file  ?Stress: Not on file  ?Social Connections: Not on file  ?Intimate Partner Violence: Not on file  ? ? ?Outpatient Medications Prior to Visit  ?Medication Sig Dispense  Refill  ? cloNIDine (CATAPRES) 0.1 MG tablet Take 1 tablet (0.1 mg total) by mouth 2 (two) times daily. 180 tablet 1  ? Olmesartan-amLODIPine-HCTZ 40-10-25 MG TABS Take 1 tablet by mouth daily. 90 tablet 1  ? metoprolol succinate (TOPROL-XL) 100 MG 24 hr tablet Take 1 tablet (100 mg total) by mouth daily. Take with or immediately following a meal. 90 tablet 1  ? ?No facility-administered medications prior to visit.  ? ? ?No Known Allergies ? ?ROS ? ?   ?Objective:  ?  ?Physical Exam ?Constitutional:   ?   General: He is not in acute distress. ?   Appearance: Normal appearance. He is not ill-appearing.  ?HENT:  ?   Head: Normocephalic and atraumatic.  ?   Right Ear: External ear normal.  ?   Left Ear: External ear normal.  ?Eyes:  ?   Extraocular Movements: Extraocular  movements intact.  ?   Pupils: Pupils are equal, round, and reactive to light.  ?Cardiovascular:  ?   Rate and Rhythm: Normal rate and regular rhythm.  ?   Heart sounds: Normal heart sounds. No murmur heard. ?  No gallop.  ?Pulmonary:  ?   Effort: Pulmonary effort is normal. No respiratory distress.  ?   Breath sounds: Normal breath sounds. No wheezing or rales.  ?Skin: ?   General: Skin is warm and dry.  ?Neurological:  ?   Mental Status: He is alert and oriented to person, place, and time.  ?Psychiatric:     ?   Judgment: Judgment normal.  ? ? ?BP (!) 154/99 (BP Location: Right Arm, Patient Position: Sitting, Cuff Size: Large)   Pulse (!) 57   Temp 98.6 ?F (37 ?C) (Oral)   Resp 16   Wt 241 lb (109.3 kg)   SpO2 100%   BMI 30.94 kg/m?  ?Wt Readings from Last 3 Encounters:  ?02/14/22 241 lb (109.3 kg)  ?02/04/22 239 lb (108.4 kg)  ?08/14/20 241 lb (109.3 kg)  ? ? ?   ?Assessment & Plan:  ? ?Problem List Items Addressed This Visit   ? ?  ? Unprioritized  ? Essential hypertension  ?  BP Readings from Last 3 Encounters:  ?02/14/22 (!) 154/99  ?02/04/22 (!) 170/112  ?08/14/20 110/80  ?BP is improving but still above goal. Pt is advised as follows: ? ?Stop metoprolol. ?Start carvedilol 74m twice daily. ?If BP still above goal next visit, will plan referral to the HTN clinic.  ?  ?  ? Relevant Medications  ? carvedilol (COREG) 25 MG tablet  ? Depression  ?   ?  02/04/2022  ?  2:09 PM 01/31/2020  ?  7:01 AM 08/01/2018  ?  1:08 PM 02/27/2017  ?  7:48 AM  ?Depression screen PHQ 2/9  ?Decreased Interest 1 0 0 1  ?Down, Depressed, Hopeless 2 0 0 1  ?PHQ - 2 Score 3 0 0 2  ?Altered sleeping _0 ?Tired, decreased energy _1 ?Change in appetite 2  0 0  ?Feeling bad or failure about yourself  1  0 0  ?Trouble concentrating 2  0 0  ?Moving slowly or fidgety/restless 0  0 0  ?Suicidal thoughts 0  0 0  ?PHQ-9 Score _2 ?Uncontrolled. He is interested in trying a medication. Will initiate lexapro 164m  I instructed pt  to start 1/2 tablet once daily for 1 week and then increase to a full tablet  once daily on week two as tolerated.  We discussed common side effects such as nausea, drowsiness and weight gain.   He is instructed to discontinue medication go directly to ED if this occurs.  Pt verbalizes understanding.  Plan follow up in 1 month to evaluate progress.   ? ? ?  ?  ? Relevant Medications  ? escitalopram (LEXAPRO) 10 MG tablet  ? ? ? ?Meds ordered this encounter  ?Medications  ? carvedilol (COREG) 25 MG tablet  ?  Sig: Take 1 tablet (25 mg total) by mouth 2 (two) times daily with a meal.  ?  Dispense:  60 tablet  ?  Refill:  3  ?  Order Specific Question:   Supervising Provider  ?  Answer:   Penni Homans A [[id number]]  ? escitalopram (LEXAPRO) 10 MG tablet  ?  Sig: 1/2 tab by mouth once daily for 1 week, then increase to a full tab once daily on week two.  ?  Dispense:  30 tablet  ?  Refill:  0  ?  Order Specific Question:   Supervising Provider  ?  Answer:   Penni Homans A [[id number]]  ? ? ?I, Nance Pear, NP, personally preformed the services described in this documentation.  All medical record entries made by the scribe were at my direction and in my presence.  I have reviewed the chart and discharge instructions (if applicable) and agree that the record reflects my personal performance and is accurate and complete. 02/14/2022 ? ? ?Engineering geologist as a Education administrator for [employer], NP.,have documented all relevant documentation on the behalf of Nance Pear, NP,as directed by  Nance Pear, NP while in the presence of Nance Pear, NP. ? ? ?Nance Pear, NP ? ?

## 2022-02-14 NOTE — Assessment & Plan Note (Addendum)
BP Readings from Last 3 Encounters:  ?02/14/22 (!) 154/99  ?02/04/22 (!) 170/112  ?08/14/20 110/80  ? ?BP is improving but still above goal. Pt is advised as follows: ? ?Stop metoprolol. ?Start carvedilol 25mg  twice daily. ?If BP still above goal next visit, will plan referral to the HTN clinic.  ?

## 2022-02-14 NOTE — Assessment & Plan Note (Addendum)
?    02/04/2022  ?  2:09 PM 01/31/2020  ?  7:01 AM 08/01/2018  ?  1:08 PM 02/27/2017  ?  7:48 AM  ?Depression screen PHQ 2/9  ?Decreased Interest 1 0 0 1  ?Down, Depressed, Hopeless 2 0 0 1  ?PHQ - 2 Score 3 0 0 2  ?Altered sleeping 3  1 1   ?Tired, decreased energy 3  1 1   ?Change in appetite 2  0 0  ?Feeling bad or failure about yourself  1  0 0  ?Trouble concentrating 2  0 0  ?Moving slowly or fidgety/restless 0  0 0  ?Suicidal thoughts 0  0 0  ?PHQ-9 Score 14  2 4   ? ?Uncontrolled. He is interested in trying a medication. Will initiate lexapro 10mg .  I instructed pt to start 1/2 tablet once daily for 1 week and then increase to a full tablet once daily on week two as tolerated.  We discussed common side effects such as nausea, drowsiness and weight gain.   He is instructed to discontinue medication go directly to ED if this occurs.  Pt verbalizes understanding.  Plan follow up in 1 month to evaluate progress.   ? ? ?

## 2022-02-14 NOTE — Patient Instructions (Signed)
Stop metoprolol. ?Start carvedilol twice daily. ?Start lexapro 10mg  1/2 tab once daily at bedtime for 1 week, then increase to a full tablet once daily.  ? ?

## 2022-03-07 ENCOUNTER — Ambulatory Visit: Payer: BC Managed Care – PPO | Admitting: Family

## 2022-03-07 ENCOUNTER — Ambulatory Visit (INDEPENDENT_AMBULATORY_CARE_PROVIDER_SITE_OTHER): Payer: BC Managed Care – PPO | Admitting: Family

## 2022-03-07 DIAGNOSIS — I10 Essential (primary) hypertension: Secondary | ICD-10-CM | POA: Diagnosis not present

## 2022-03-07 DIAGNOSIS — F32A Depression, unspecified: Secondary | ICD-10-CM

## 2022-03-07 MED ORDER — ESCITALOPRAM OXALATE 10 MG PO TABS: 10.0000 mg | ORAL_TABLET | Freq: Every day | ORAL | 0 refills | Status: DC

## 2022-03-07 NOTE — Patient Instructions (Signed)
Continue current meds & doses 

## 2022-03-07 NOTE — Progress Notes (Signed)
? ?Subjective:  ? ?By signing my name below, I, Taylor Herrera, attest that this documentation has been prepared under the direction and in the presence of   [name], NP 03/07/2022 ? ? Patient ID: Taylor Herrera, male    DOB: [birth year], 46 y.o.   MRN: [id number] ? ?Chief Complaint  ?Patient presents with  ? Hypertension  ?  Here for Follow Up   ? ? ?HPI ?Patient is in today for an office visit and 3 week f/u ? ?Hypertension- His blood pressure is stable at today's visit. Since starting 25 mg carvedilol, his readings at home have been stable and he reports no side effects.  ?BP Readings from Last 3 Encounters:  ?03/07/22 138/78  ?02/14/22 (!) 154/99  ?02/04/22 (!) 170/112  ?  ?Pulse Readings from Last 3 Encounters:  ?03/07/22 68  ?02/14/22 (!) 57  ?02/04/22 83  ?  ? ?Depression- He started 10 mg lexapro and is doing well on it. He takes it in the night and is satisfied with the results because he is able to sleep better now.  ? ?Past Medical History:  ?Diagnosis Date  ? Essential hypertension 09/16/2014  ? History of alcohol abuse 08/01/2018  ? History of kidney stones   ? HTN (hypertension) 04/23/2015  ? Hypertension   ? Migraine   ? Preventative health care 04/23/2015  ? ? ?No past surgical history on file. ? ?Family History  ?Problem Relation Age of Onset  ? Cancer Mother   ?     multiple myeloma, ovarian  ? Hypertension Mother   ? Hypertension Father   ? Arrhythmia Father   ? Kidney disease Father   ?     ESRD on HD  ? Hypertension Sister   ? ? ?Social History  ? ?Socioeconomic History  ? Marital status: Married  ?  Spouse name: Not on file  ? Number of children: Not on file  ? Years of education: Not on file  ? Highest education level: Not on file  ?Occupational History  ? Not on file  ?Tobacco Use  ? Smoking status: Never  ? Smokeless tobacco: Never  ?Substance and Sexual Activity  ? Alcohol use: No  ?  Comment: occasional  ? Drug use: No  ? Sexual activity: Not on file  ?Other Topics Concern  ? Not on  file  ?Social History Narrative  ? Driver for [employer]  ? 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 Originally from [place]  ? 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 Completed associates degree  ? ?Social Determinants of Health  ? ?Financial Resource Strain: Not on file  ?Food Insecurity: Not on file  ?Transportation Needs: Not on file  ?Physical Activity: Not on file  ?Stress: Not on file  ?Social Connections: Not on file  ?Intimate Partner Violence: Not on file  ? ? ?Outpatient Medications Prior to Visit  ?Medication Sig Dispense Refill  ? carvedilol (COREG) 25 MG tablet Take 1 tablet (25 mg total) by mouth 2 (two) times daily with a meal. 60 tablet 3  ? cloNIDine (CATAPRES) 0.1 MG tablet Take 1 tablet (0.1 mg total) by mouth 2 (two) times daily. 180 tablet 1  ? Olmesartan-amLODIPine-HCTZ 40-10-25 MG TABS Take 1 tablet by mouth daily. 90 tablet 1  ? escitalopram (LEXAPRO) 10 MG tablet 1/2 tab by mouth once daily for 1 week, then increase to a full tab once daily on week two. 30 tablet 0  ? ?No  facility-administered medications prior to visit.  ? ? ?No Known Allergies ? ?ROS ? ?   ?Objective:  ?  ?Physical Exam ? ?BP 138/78 (BP Location: Left Arm, Patient Position: Sitting, Cuff Size: Normal)   Pulse 68   Temp 97.7 ?F (36.5 ?C) (Oral)   Resp 16   Ht _0  (1.854 m)   Wt 231 lb (104.8 kg)   SpO2 97%   BMI 30.48 kg/m?  ?Wt Readings from Last 3 Encounters:  ?03/07/22 231 lb (104.8 kg)  ?02/14/22 241 lb (109.3 kg)  ?02/04/22 239 lb (108.4 kg)  ? ? ?Diabetic Foot Exam - Simple   ?No data filed ?  ? ?Lab Results  ?Component Value Date  ? WBC 9.4 01/31/2020  ? HGB 14.3 01/31/2020  ? HCT 41.8 01/31/2020  ? PLT 299.0 01/31/2020  ? GLUCOSE 95 02/04/2022  ? CHOL 176 01/31/2020  ? TRIG 84.0 01/31/2020  ? HDL 55.70 01/31/2020  ? LDLCALC 104 (H) 01/31/2020  ? ALT 19 02/04/2022  ? AST 19 02/04/2022  ? NA 139 02/04/2022  ? K 4.4 02/04/2022  ? CL 101 02/04/2022  ? CREATININE 2.59 (H) 02/04/2022  ? BUN 37 (H)  02/04/2022  ? CO2 24 02/04/2022  ? TSH 0.75 01/31/2020  ? PSA 0.23 01/31/2020  ? ? ?Lab Results  ?Component Value Date  ? TSH 0.75 01/31/2020  ? ?Lab Results  ?Component Value Date  ? WBC 9.4 01/31/2020  ? HGB 14.3 01/31/2020  ? HCT 41.8 01/31/2020  ? MCV 87.7 01/31/2020  ? PLT 299.0 01/31/2020  ? ?Lab Results  ?Component Value Date  ? NA 139 02/04/2022  ? K 4.4 02/04/2022  ? CO2 24 02/04/2022  ? GLUCOSE 95 02/04/2022  ? BUN 37 (H) 02/04/2022  ? CREATININE 2.59 (H) 02/04/2022  ? BILITOT 0.6 02/04/2022  ? ALKPHOS 109 01/31/2020  ? AST 19 02/04/2022  ? ALT 19 02/04/2022  ? PROT 7.7 02/04/2022  ? ALBUMIN 4.0 01/31/2020  ? CALCIUM 9.8 02/04/2022  ? ANIONGAP 12 03/19/2016  ? GFR 63.66 06/29/2020  ? ?Lab Results  ?Component Value Date  ? CHOL 176 01/31/2020  ? ?Lab Results  ?Component Value Date  ? HDL 55.70 01/31/2020  ? ?Lab Results  ?Component Value Date  ? LDLCALC 104 (H) 01/31/2020  ? ?Lab Results  ?Component Value Date  ? TRIG 84.0 01/31/2020  ? ?Lab Results  ?Component Value Date  ? CHOLHDL 3 01/31/2020  ? ?No results found for: HGBA1C ? ?   ?Assessment & Plan:  ? ?Problem List Items Addressed This Visit   ? ?  ? Unprioritized  ? Essential hypertension  ?  BP looks great today!  I commended him on this. Will continue carvedilol, clonidine, tribenzor.  ? ?BP Readings from Last 3 Encounters:  ?03/07/22 138/78  ?02/14/22 (!) 154/99  ?02/04/22 (!) 170/112  ? ? ?  ?  ? Depression  ?  Already noting improvement in his mood and sleep since he began lexapro 2 weeks ago. Continue 35m PO QHS.  ? ?  ?  ? Relevant Medications  ? escitalopram (LEXAPRO) 10 MG tablet  ? ?Meds ordered this encounter  ?Medications  ? escitalopram (LEXAPRO) 10 MG tablet  ?  Sig: Take 1 tablet (10 mg total) by mouth at bedtime. 1/2 tab by mouth once daily for 1 week, then increase to a full tab once daily on week two.  ?  Dispense:  90 tablet  ?  Refill:  0  ?  Order Specific Question:   Supervising Provider  ?  Answer:   Penni Homans A [[id number]]   ? ? ?I, Nance Pear, NP, personally preformed the services described in this documentation.  All medical record entries made by the scribe were at my direction and in my presence.  I have reviewed the chart and discharge instructions (if applicable) and agree that the record reflects my personal performance and is accurate and complete. _0 @ ? ? ? ? ?Nance Pear, NP ? ?

## 2022-03-07 NOTE — Assessment & Plan Note (Signed)
Already noting improvement in his mood and sleep since he began lexapro 2 weeks ago. Continue 10mg  PO QHS.  ?

## 2022-03-07 NOTE — Progress Notes (Incomplete)
? ?Subjective:  ? ?By signing my name below, I, [name], attest that this documentation has been prepared under the direction and in the presence of [name], NP 03/07/2022  ? ? Patient ID: Taylor Herrera, male    DOB: 1976-05-11, 46 y.o.   MRN: [id number] ? ?No chief complaint on file. ? ? ?HPI ?Patient is in today for an office visit and 3 week f/u ? ?Hypertension- ?Stress- ? ?Past Medical History:  ?Diagnosis Date  ? Essential hypertension 09/16/2014  ? History of alcohol abuse 08/01/2018  ? History of kidney stones   ? HTN (hypertension) 04/23/2015  ? Hypertension   ? Migraine   ? Preventative health care 04/23/2015  ? ? ?No past surgical history on file. ? ?Family History  ?Problem Relation Age of Onset  ? Cancer Mother   ?     multiple myeloma, ovarian  ? Hypertension Mother   ? Hypertension Father   ? Arrhythmia Father   ? Kidney disease Father   ?     ESRD on HD  ? Hypertension Sister   ? ? ?Social History  ? ?Socioeconomic History  ? Marital status: Married  ?  Spouse name: Not on file  ? Number of children: Not on file  ? Years of education: Not on file  ? Highest education level: Not on file  ?Occupational History  ? Not on file  ?Tobacco Use  ? Smoking status: Never  ? Smokeless tobacco: Never  ?Substance and Sexual Activity  ? Alcohol use: No  ?  Comment: occasional  ? Drug use: No  ? Sexual activity: Not on file  ?Other Topics Concern  ? Not on file  ?Social History Narrative  ? Driver for [employer]  ? 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 Originally from [place]  ? 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 Completed associates degree  ? ?Social Determinants of Health  ? ?Financial Resource Strain: Not on file  ?Food Insecurity: Not on file  ?Transportation Needs: Not on file  ?Physical Activity: Not on file  ?Stress: Not on file  ?Social Connections: Not on file  ?Intimate Partner Violence: Not on file  ? ? ?Outpatient Medications Prior to Visit  ?Medication Sig Dispense  Refill  ? carvedilol (COREG) 25 MG tablet Take 1 tablet (25 mg total) by mouth 2 (two) times daily with a meal. 60 tablet 3  ? cloNIDine (CATAPRES) 0.1 MG tablet Take 1 tablet (0.1 mg total) by mouth 2 (two) times daily. 180 tablet 1  ? escitalopram (LEXAPRO) 10 MG tablet 1/2 tab by mouth once daily for 1 week, then increase to a full tab once daily on week two. 30 tablet 0  ? Olmesartan-amLODIPine-HCTZ 40-10-25 MG TABS Take 1 tablet by mouth daily. 90 tablet 1  ? ?No facility-administered medications prior to visit.  ? ? ?No Known Allergies ? ?Review of Systems  ?Constitutional:  Negative for fever.  ?HENT:  Negative for ear pain and hearing loss.   ?     (-)nystagmus ?(-)adenopathy  ?Eyes:  Negative for blurred vision.  ?Respiratory:  Negative for cough, shortness of breath and wheezing.   ?Cardiovascular:  Negative for chest pain and leg swelling.  ?Gastrointestinal:  Negative for blood in stool, diarrhea, nausea and vomiting.  ?Genitourinary:  Negative for dysuria and frequency.  ?Musculoskeletal:  Negative for joint pain and myalgias.  ?Skin:  Negative for rash.  ?Neurological:  Negative for headaches.  ?Psychiatric/Behavioral:  Negative for depression. The patient is not nervous/anxious.   ? ?   ?Objective:  ?  ?Physical Exam ?Constitutional:   ?   General: He is not in acute distress. ?   Appearance: Normal appearance.  ?HENT:  ?   Head: Normocephalic and atraumatic.  ?Cardiovascular:  ?   Rate and Rhythm: Normal rate and regular rhythm.  ?   Heart sounds: No murmur heard. ?Pulmonary:  ?   Effort: No respiratory distress.  ?   Breath sounds: Normal breath sounds. No wheezing or rales.  ?Skin: ?   General: Skin is warm and dry.  ?Neurological:  ?   Mental Status: He is alert and oriented to person, place, and time.  ?Psychiatric:     ?   Behavior: Behavior normal.     ?   Thought Content: Thought content normal.  ? ? ?There were no vitals taken for this visit. ?Wt Readings from Last 3 Encounters:  ?02/14/22  241 lb (109.3 kg)  ?02/04/22 239 lb (108.4 kg)  ?08/14/20 241 lb (109.3 kg)  ? ? ?Diabetic Foot Exam - Simple   ?No data filed ?  ? ?Lab Results  ?Component Value Date  ? WBC 9.4 01/31/2020  ? HGB 14.3 01/31/2020  ? HCT 41.8 01/31/2020  ? PLT 299.0 01/31/2020  ? GLUCOSE 95 02/04/2022  ? CHOL 176 01/31/2020  ? TRIG 84.0 01/31/2020  ? HDL 55.70 01/31/2020  ? LDLCALC 104 (H) 01/31/2020  ? ALT 19 02/04/2022  ? AST 19 02/04/2022  ? NA 139 02/04/2022  ? K 4.4 02/04/2022  ? CL 101 02/04/2022  ? CREATININE 2.59 (H) 02/04/2022  ? BUN 37 (H) 02/04/2022  ? CO2 24 02/04/2022  ? TSH 0.75 01/31/2020  ? PSA 0.23 01/31/2020  ? ? ?Lab Results  ?Component Value Date  ? TSH 0.75 01/31/2020  ? ?Lab Results  ?Component Value Date  ? WBC 9.4 01/31/2020  ? HGB 14.3 01/31/2020  ? HCT 41.8 01/31/2020  ? MCV 87.7 01/31/2020  ? PLT 299.0 01/31/2020  ? ?Lab Results  ?Component Value Date  ? NA 139 02/04/2022  ? K 4.4 02/04/2022  ? CO2 24 02/04/2022  ? GLUCOSE 95 02/04/2022  ? BUN 37 (H) 02/04/2022  ? CREATININE 2.59 (H) 02/04/2022  ? BILITOT 0.6 02/04/2022  ? ALKPHOS 109 01/31/2020  ? AST 19 02/04/2022  ? ALT 19 02/04/2022  ? PROT 7.7 02/04/2022  ? ALBUMIN 4.0 01/31/2020  ? CALCIUM 9.8 02/04/2022  ? ANIONGAP 12 03/19/2016  ? GFR 63.66 06/29/2020  ? ?Lab Results  ?Component Value Date  ? CHOL 176 01/31/2020  ? ?Lab Results  ?Component Value Date  ? HDL 55.70 01/31/2020  ? ?Lab Results  ?Component Value Date  ? LDLCALC 104 (H) 01/31/2020  ? ?Lab Results  ?Component Value Date  ? TRIG 84.0 01/31/2020  ? ?Lab Results  ?Component Value Date  ? CHOLHDL 3 01/31/2020  ? ?No results found for: HGBA1C ? ?   ?Assessment & Plan:  ? ?Problem List Items Addressed This Visit   ?None ? ? ?No orders of the defined types were placed in this encounter. ? ? ?I,Zite Okoli,acting as a Education administrator for [employer], NP.,have documented all relevant documentation on the behalf of Nance Pear, NP,as directed by  Nance Pear, NP while in the  presence of Nance Pear, NP.  ? ?I, [name], NP , personally preformed the services described in this documentation.  All medical record entries  made by the scribe were at my direction and in my presence.  I have reviewed the chart and discharge instructions (if applicable) and agree that the record reflects my personal performance and is accurate and complete. 03/07/2022 ?

## 2022-03-07 NOTE — Assessment & Plan Note (Addendum)
BP looks great today!  I commended him on this. Will continue carvedilol, clonidine, tribenzor.  ? ?BP Readings from Last 3 Encounters:  ?03/07/22 138/78  ?02/14/22 (!) 154/99  ?02/04/22 (!) 170/112  ? ? ?

## 2022-03-16 ENCOUNTER — Ambulatory Visit: Payer: BC Managed Care – PPO | Admitting: Pulmonary Disease

## 2022-03-18 ENCOUNTER — Other Ambulatory Visit: Payer: Self-pay | Admitting: *Deleted

## 2022-03-18 ENCOUNTER — Telehealth: Payer: Self-pay | Admitting: *Deleted

## 2022-03-18 DIAGNOSIS — G4733 Obstructive sleep apnea (adult) (pediatric): Secondary | ICD-10-CM

## 2022-03-18 NOTE — Telephone Encounter (Signed)
Called and spoke with patient regarding his f/u on 5/1.  He has not had his HST and it was cancelled.  He states that he initially had [place] and it was denied.  He was then uninsured and it was cancelled.  He now has [employer] and can now do the HST.  Advised I would order the HST and the PCCs will run it through his insurance and then he will be contacted to schedule the test.  Advised we are scheduling anywhere from 7-12 weeks. I let him know I would cancel his appointment on Monday, 5/1 and once he has the HST he can call us to reschedule his OV.  He verbalized understanding.  Nothing further needed. ?

## 2022-03-21 ENCOUNTER — Ambulatory Visit: Payer: BC Managed Care – PPO | Admitting: Adult Health

## 2022-03-28 DIAGNOSIS — I129 Hypertensive chronic kidney disease with stage 1 through stage 4 chronic kidney disease, or unspecified chronic kidney disease: Secondary | ICD-10-CM | POA: Diagnosis not present

## 2022-03-28 DIAGNOSIS — Z87442 Personal history of urinary calculi: Secondary | ICD-10-CM | POA: Diagnosis not present

## 2022-03-28 DIAGNOSIS — N1832 Chronic kidney disease, stage 3b: Secondary | ICD-10-CM | POA: Diagnosis not present

## 2022-03-29 ENCOUNTER — Other Ambulatory Visit: Payer: Self-pay | Admitting: Nephrology

## 2022-03-29 DIAGNOSIS — N1832 Chronic kidney disease, stage 3b: Secondary | ICD-10-CM

## 2022-03-29 DIAGNOSIS — I129 Hypertensive chronic kidney disease with stage 1 through stage 4 chronic kidney disease, or unspecified chronic kidney disease: Secondary | ICD-10-CM

## 2022-03-29 DIAGNOSIS — Z87442 Personal history of urinary calculi: Secondary | ICD-10-CM

## 2022-03-31 LAB — VITAMIN D 25 HYDROXY (VIT D DEFICIENCY, FRACTURES): Vit D, 25-Hydroxy: 14.2

## 2022-03-31 LAB — COMPREHENSIVE METABOLIC PANEL
Albumin: 4.2 (ref 3.5–5.0)
Calcium: 9.5 (ref 8.7–10.7)
eGFR: 31

## 2022-03-31 LAB — CBC AND DIFFERENTIAL
HCT: 39 — AB (ref 41–53)
Hemoglobin: 13.1 — AB (ref 13.5–17.5)
Neutrophils Absolute: 62
Platelets: 301 10*3/uL (ref 150–400)
WBC: 7.3

## 2022-03-31 LAB — BASIC METABOLIC PANEL
BUN: 31 — AB (ref 4–21)
CO2: 24 — AB (ref 13–22)
Chloride: 101 (ref 99–108)
Creatinine: 2.6 — AB (ref 0.6–1.3)
Glucose: 99
Potassium: 3.7 mEq/L (ref 3.5–5.1)
Sodium: 139 (ref 137–147)

## 2022-03-31 LAB — MICROALBUMIN / CREATININE URINE RATIO: Microalb Creat Ratio: 61.4

## 2022-03-31 LAB — CBC: RBC: 4.56 (ref 3.87–5.11)

## 2022-04-04 ENCOUNTER — Ambulatory Visit
Admission: RE | Admit: 2022-04-04 | Discharge: 2022-04-04 | Disposition: A | Discharge status: Home / Self Care | Payer: BC Managed Care – PPO | Source: Ambulatory Visit | Attending: Nephrology | Admitting: Nephrology

## 2022-04-04 DIAGNOSIS — Z87442 Personal history of urinary calculi: Secondary | ICD-10-CM

## 2022-04-04 DIAGNOSIS — N1832 Chronic kidney disease, stage 3b: Secondary | ICD-10-CM

## 2022-04-04 DIAGNOSIS — N189 Chronic kidney disease, unspecified: Secondary | ICD-10-CM | POA: Diagnosis not present

## 2022-04-04 DIAGNOSIS — I129 Hypertensive chronic kidney disease with stage 1 through stage 4 chronic kidney disease, or unspecified chronic kidney disease: Secondary | ICD-10-CM

## 2022-04-13 ENCOUNTER — Telehealth: Payer: Self-pay | Admitting: Pulmonary Disease

## 2022-04-13 ENCOUNTER — Encounter: Payer: Self-pay | Admitting: Family

## 2022-04-13 NOTE — Telephone Encounter (Signed)
ATC patient again and he answered. Patient voiced understanding from Dr. Angus Palms recommendations.  He wanted to know how long it would be before his HST would get scheduled and I advised him they are about 8-10 weeks out last I heard. Patients HST was ordered in April 2023. Let patient know I would route to [facility] but they would get him scheduled when they got to his chart since they are still trying to catch up. PCCs routing to you as an FYI. 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

## 2022-04-13 NOTE — Telephone Encounter (Signed)
Dr. Vassie Loll are you okay with [place] providing patient with a letter stating that he has OSA?

## 2022-04-15 NOTE — Telephone Encounter (Signed)
Spoke with pt.  See letter.

## 2022-04-25 ENCOUNTER — Ambulatory Visit: Payer: BC Managed Care – PPO | Admitting: Family

## 2022-04-25 ENCOUNTER — Ambulatory Visit: Payer: BC Managed Care – PPO

## 2022-04-25 VITALS — BP 110/76 | HR 57 | Temp 97.8°F | Resp 16 | Wt 238.0 lb

## 2022-04-25 DIAGNOSIS — E559 Vitamin D deficiency, unspecified: Secondary | ICD-10-CM

## 2022-04-25 DIAGNOSIS — I10 Essential (primary) hypertension: Secondary | ICD-10-CM

## 2022-04-25 DIAGNOSIS — G4733 Obstructive sleep apnea (adult) (pediatric): Secondary | ICD-10-CM | POA: Diagnosis not present

## 2022-04-25 DIAGNOSIS — G43809 Other migraine, not intractable, without status migrainosus: Secondary | ICD-10-CM

## 2022-04-25 DIAGNOSIS — F1011 Alcohol abuse, in remission: Secondary | ICD-10-CM

## 2022-04-25 DIAGNOSIS — F32A Depression, unspecified: Secondary | ICD-10-CM

## 2022-04-25 MED ORDER — HM VITAMIN D3 100 MCG (4000 UT) PO CAPS: ORAL_CAPSULE | ORAL | Status: AC

## 2022-04-25 NOTE — Assessment & Plan Note (Signed)
Stable.  Monitor.  

## 2022-04-25 NOTE — Assessment & Plan Note (Signed)
Stable/improved. Continue lexapro 10 mg.

## 2022-04-25 NOTE — Assessment & Plan Note (Signed)
No alcohol use currently 

## 2022-04-25 NOTE — Assessment & Plan Note (Addendum)
Stable/improved on tribenzor, clonidine, tribenzor.  BP Readings from Last 3 Encounters:  04/25/22 110/76  03/07/22 138/78  02/14/22 (!) 154/99

## 2022-04-25 NOTE — Assessment & Plan Note (Signed)
States he added vit D 4000 iu once daily per nephrology recommendations.

## 2022-04-25 NOTE — Progress Notes (Signed)
Subjective:   By signing my name below, I, Shehryar Baig, attest that this documentation has been prepared under the direction and in the presence of [name], NP 04/25/2022      Patient ID: Taylor Herrera, male    DOB: Jun 05, 1976, 46 y.o.   MRN: [id number]  Chief Complaint  Patient presents with   Depression    Here for follow up   Hypertension    Here for follow up     Patient is in today for a follow up visit.   Depression- He continues taking 10 mg lexapro daily PO and reports having a good mood while taking it. He takes it before sleeping at night and reports it helps calm his thoughts before sleeping. He is managing his daily stress at this time.  Blood pressure- His blood pressure is doing well during this visit. He continues taking 25 mg carvedilol daily PO, 40-10-25 mg olmesartan-amlodipine-HCTZ daily PO and reports no new issues while taking them.  BP Readings from Last 3 Encounters:  04/25/22 110/76  03/07/22 138/78  02/14/22 (!) 154/99   Pulse Readings from Last 3 Encounters:  04/25/22 (!) 57  03/07/22 68  02/14/22 (!) 57   Sleep- He has an appointment today for a sleep study.   Alcohol- He does not drink alcohol at this time.   Immunizations- He has 3 Covid-19 vaccines at this time. He is not interested in hepatitis C screening.   Vitamin D- During his last visit with his nephrologist his vitamin D levels were low. He is now taking 4000 units of vitamin D daily PO.    Health Maintenance Due  Topic Date Due   COVID-19 Vaccine (1) Never done    Past Medical History:  Diagnosis Date   Essential hypertension 09/16/2014   History of alcohol abuse 08/01/2018   History of kidney stones    HTN (hypertension) 04/23/2015   Hypertension    Migraine    Preventative health care 04/23/2015    No past surgical history on file.  Family History  Problem Relation Age of Onset   Cancer Mother        multiple myeloma, ovarian   Hypertension Mother     Hypertension Father    Arrhythmia Father    Kidney disease Father        ESRD on HD   Hypertension Sister     Social History   Socioeconomic History   Marital status: Married    Spouse name: Not on file   Number of children: Not on file   Years of education: Not on file   Highest education level: Not on file  Occupational History   Not on file  Tobacco Use   Smoking status: Never   Smokeless tobacco: Never  Substance and Sexual Activity   Alcohol use: No    Comment: occasional   Drug use: No   Sexual activity: Not on file  Other Topics Concern   Not on file  Social History Narrative   Driver for [employer]   Married- two kids (one son and one daughter) both healthy   Originally from [place]   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 60 tablet 3   cloNIDine (CATAPRES) 0.1 MG tablet Take 1 tablet (0.1 mg total) by mouth 2 (two) times daily. 180 tablet 1   escitalopram (LEXAPRO) 10 MG tablet Take 1 tablet (10 mg total) by mouth at bedtime. 1/2 tab by mouth once daily for 1 week, then increase to a full tab once daily on week two. 90 tablet 0   Olmesartan-amLODIPine-HCTZ 40-10-25 MG TABS Take 1 tablet by mouth daily. 90 tablet 1   No facility-administered medications prior to visit.    No Known Allergies  Review of Systems  Psychiatric/Behavioral:  Positive for depression.       Objective:    Physical Exam Constitutional:      General: He is not in acute distress.    Appearance: Normal appearance. He is  not ill-appearing.  HENT:     Head: Normocephalic and atraumatic.     Right Ear: External ear normal.     Left Ear: External ear normal.  Eyes:     Extraocular Movements: Extraocular movements intact.     Pupils: Pupils are equal, round, and reactive to light.  Cardiovascular:     Rate and Rhythm: Normal rate and regular rhythm.     Heart sounds: Normal heart sounds. No murmur heard.   No gallop.  Pulmonary:     Effort: Pulmonary effort is normal. No respiratory distress.     Breath sounds: Normal breath sounds. No wheezing or rales.  Skin:    General: Skin is warm and dry.  Neurological:     Mental Status: He is alert and oriented to person, place, and time.  Psychiatric:        Judgment: Judgment normal.    BP 110/76 (BP Location: Right Arm, Patient Position: Sitting, Cuff Size: Large)   Pulse (!) 57   Temp 97.8 F (36.6 C) (Oral)   Resp 16   Wt 238 lb (108 kg)   SpO2 100%   BMI 31.40 kg/m  Wt Readings from Last 3 Encounters:  04/25/22 238 lb (108 kg)  03/07/22 231 lb (104.8 kg)  02/14/22 241 lb (109.3 kg)       Assessment & Plan:   Problem List Items Addressed This Visit       Unprioritized   Vitamin D deficiency - Primary    States he added vit D 4000 iu once daily per nephrology recommendations.        Relevant Medications   Cholecalciferol (HM VITAMIN D3) 100 MCG (4000 UT) CAPS   Migraine    Stable.  Monitor.         History of alcohol abuse    No alcohol use currently.         Essential hypertension    Stable/improved on tribenzor, clonidine, tribenzor.  BP Readings from Last 3 Encounters:  04/25/22 110/76  03/07/22 138/78  02/14/22 (!) 154/99        Depression    Stable/improved. Continue lexapro 10 mg.          Meds ordered this encounter  Medications   Cholecalciferol (HM VITAMIN D3) 100 MCG (4000 UT) CAPS    Sig: Take 1 tablet by mouth once daily.    Dispense:  30 capsule    Order Specific Question:   Supervising Provider     Answer:  BLYTH, STACEY A [[id number]]    I, Nance Pear, NP, personally preformed the services described in this documentation.  All medical record entries made by the scribe were at my direction and in my presence.  I have reviewed the chart and discharge instructions (if applicable) and agree that the record reflects my personal performance and is accurate and complete. 04/25/2022   I,Shehryar Baig,acting as a scribe for Nance Pear, NP.,have documented all relevant documentation on the behalf of Nance Pear, NP,as directed by  Nance Pear, NP while in the presence of Nance Pear, NP.   Nance Pear, NP

## 2022-04-25 NOTE — Assessment & Plan Note (Signed)
>>  ASSESSMENT AND PLAN FOR HISTORY OF ALCOHOL ABUSE WRITTEN ON 04/25/2022 10:19 AM BY O'SULLIVAN, Audrina Marten, NP  No alcohol use currently.

## 2022-04-26 DIAGNOSIS — G4733 Obstructive sleep apnea (adult) (pediatric): Secondary | ICD-10-CM | POA: Diagnosis not present

## 2022-04-26 NOTE — Telephone Encounter (Signed)
HST showed mild  OSA with AHI 6/ hr  However O2 sat dropped significantly during the study (out of proportion to mild degree of OSA)  He needs OV to discuss - last seen 2021 Options include split study in lab (more detailed study) or treat & see how he does

## 2022-05-02 NOTE — Telephone Encounter (Signed)
Spoke with patient regarding sleep study results. They verbalized understanding. No further questions. Has been scheduled for OV tomorrow with Joellen Jersey. Nothing further needed at this time.

## 2022-05-03 ENCOUNTER — Encounter: Payer: Self-pay | Admitting: Nurse Practitioner

## 2022-05-03 ENCOUNTER — Ambulatory Visit: Payer: BC Managed Care – PPO | Admitting: Nurse Practitioner

## 2022-05-03 VITALS — BP 160/110 | HR 66 | Ht 73.0 in | Wt 240.6 lb

## 2022-05-03 DIAGNOSIS — G4733 Obstructive sleep apnea (adult) (pediatric): Secondary | ICD-10-CM

## 2022-05-03 DIAGNOSIS — G4734 Idiopathic sleep related nonobstructive alveolar hypoventilation: Secondary | ICD-10-CM | POA: Diagnosis not present

## 2022-05-03 DIAGNOSIS — I10 Essential (primary) hypertension: Secondary | ICD-10-CM | POA: Diagnosis not present

## 2022-05-03 NOTE — Patient Instructions (Signed)
Split night study ordered - someone will contact you for scheduling   Follow up with Dr. Vassie Loll or Florentina Addison Issaih Kaus,NP after split night study. If symptoms do not improve or worsen, please contact office for sooner follow up or seek emergency care.

## 2022-05-03 NOTE — Assessment & Plan Note (Addendum)
HST with very mild OSA; however, had significant drops in his O2 levels with average of 87% and SpO2 low 72%. No known etiology that would contribute to this. Recommended we evaluate further with split night study. Pt agreeable to this. We were able to get him scheduled for tomorrow night 6/14.   Patient Instructions  Split night study ordered - someone will contact you for scheduling   Follow up with Dr. Vassie Loll or Florentina Addison Jolana Runkles,NP after split night study. If symptoms do not improve or worsen, please contact office for sooner follow up or seek emergency care.

## 2022-05-03 NOTE — Assessment & Plan Note (Signed)
Very mild with AHI 5.8/h. Concern regarding nocturnal hypoxemia - split night ordered for further eval.

## 2022-05-03 NOTE — Assessment & Plan Note (Signed)
Hypertensive at visit; however, he had not taken his antihypertensives until he was sitting in the exam room. Advised that he recheck when he gets home and notify his PCP if he is still above 140/90. Upon review, his HTN was well-controlled at his most recent primary care visit.

## 2022-05-03 NOTE — Progress Notes (Signed)
@Patient  ID: Taylor Herrera, male    DOB: 1976/07/27, 46 y.o.   MRN: [id number]  Chief Complaint  Patient presents with   Follow-up    Pt is here today to discuss results from recent HST.      Referring provider: [name], NP  HPI: 46 year old male, never smoker followed for sleep disordered breathing.  He is a patient of Dr. Bari Mantis and last seen in office 08/14/2020. Past medical history significant for HTN, migraines, depression.  TEST/EVENTS:  04/25/2022 HST: AHI 5.8/h, SPO2 low 72% with average 87%  08/14/2020: OV with Dr. Elsworth Soho. 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 His wife reports loud snoring. He wakes not feeling refreshed. Epworth 18. Denies drowsy driving. Drives in late afternoons/evenings. Bedtime can be as late as 4 AM, sleep latency is about 30 minutes, sleeps on side with 1 pillow. Wakes 3-4 times a night. Out of bed by noon. HST ordered for concern for OSA.  05/03/2022: Today - follow up Patient presents today for overdue follow-up after undergoing HST.  He had some issues with insurance, at one point not having any and had to cancel previously scheduled HST.  He was finally able to complete this on 04/25/2022.  He had evidence of very mild OSA with AHI of 5.8; however, he had significant drops in his oxygen with SPO2 low of 72% and average of 87%.  He has no respiratory concerns or complaints.  Does not struggle with any shortness of breath at baseline.  His only cardiac history is hypertension.  He denies any palpitations or swelling in his lower extremities.  He continues to have issues with daytime fatigue.  Wakes feeling like he did not have a restful sleep.  Denies any morning headaches, narcolepsy or drowsy driving. He lives an active lifestyle and exercises regularly.  No Known Allergies  Immunization History  Administered Date(s) Administered   Tdap 05/06/2014    Past Medical History:  Diagnosis Date   Essential  hypertension 09/16/2014   History of alcohol abuse 08/01/2018   History of kidney stones    HTN (hypertension) 04/23/2015   Hypertension    Migraine    Preventative health care 04/23/2015    Tobacco History: Social History   Tobacco Use  Smoking Status Never  Smokeless Tobacco Never   Counseling given: Not Answered   Outpatient Medications Prior to Visit  Medication Sig Dispense Refill   carvedilol (COREG) 25 MG tablet Take 1 tablet (25 mg total) by mouth 2 (two) times daily with a meal. 60 tablet 3   Cholecalciferol (HM VITAMIN D3) 100 MCG (4000 UT) CAPS Take 1 tablet by mouth once daily. 30 capsule    cloNIDine (CATAPRES) 0.1 MG tablet Take 1 tablet (0.1 mg total) by mouth 2 (two) times daily. 180 tablet 1   escitalopram (LEXAPRO) 10 MG tablet Take 1 tablet (10 mg total) by mouth at bedtime. 1/2 tab by mouth once daily for 1 week, then increase to a full tab once daily on week two. 90 tablet 0   Olmesartan-amLODIPine-HCTZ 40-10-25 MG TABS Take 1 tablet by mouth daily. 90 tablet 1   No facility-administered medications prior to visit.     Review of Systems:   Constitutional: No weight loss or gain, night sweats, fevers, chills. +excessive daytime fatigue HEENT: No headaches, difficulty swallowing, tooth/dental problems, or sore throat. No sneezing, itching, ear ache, nasal congestion, or post nasal drip. +snoring CV:  No chest pain, orthopnea, PND, swelling  in lower extremities, anasarca, dizziness, palpitations, syncope Resp: No shortness of breath with exertion or at rest. No excess mucus or change in color of mucus. No productive or non-productive. No hemoptysis. No wheezing.  No chest wall deformity GI:  No heartburn, indigestion, abdominal pain, nausea, vomiting, diarrhea, change in bowel habits, loss of appetite, bloody stools.  Skin: No rash, lesions, ulcerations MSK:  No joint pain or swelling.  No decreased range of motion.  No back pain. Neuro: No dizziness or  lightheadedness.  Psych: No depression or anxiety. Mood stable.     Physical Exam:  BP (!) 160/110 (BP Location: Left Arm, Patient Position: Sitting, Cuff Size: Large) Comment: Pt stated he just took BP meds  Pulse 66   Ht 6\' 1"  (1.854 m)   Wt 240 lb 9.6 oz (109.1 kg)   SpO2 99% Comment: RA  BMI 31.74 kg/m   GEN: Pleasant, interactive, well-appearing; obese; in no acute distress. HEENT:  Normocephalic and atraumatic. PERRLA. Sclera white. Nasal turbinates pink, moist and patent bilaterally. No rhinorrhea present. Oropharynx pink and moist, without exudate or edema. No lesions, ulcerations, or postnasal drip.  NECK:  Supple w/ fair ROM. No JVD present. Normal carotid impulses w/o bruits. Thyroid symmetrical with no goiter or nodules palpated. No lymphadenopathy.   CV: RRR, no m/r/g, no peripheral edema. Pulses intact, +2 bilaterally. No cyanosis, pallor or clubbing. PULMONARY:  Unlabored, regular breathing. Clear bilaterally A&P w/o wheezes/rales/rhonchi. No accessory muscle use. No dullness to percussion. GI: BS present and normoactive. Soft, non-tender to palpation. No organomegaly or masses detected. No CVA tenderness. MSK: No erythema, warmth or tenderness. Cap refil <2 sec all extrem. No deformities or joint swelling noted.  Neuro: A/Ox3. No focal deficits noted.   Skin: Warm, no lesions or rashe Psych: Normal affect and behavior. Judgement and thought content appropriate.     Lab Results:  CBC    Component Value Date/Time   WBC 7.3 03/31/2022 0000   WBC 9.4 01/31/2020 0727   RBC 4.56 03/31/2022 0000   HGB 13.1 (A) 03/31/2022 0000   HCT 39 (A) 03/31/2022 0000   PLT 301 03/31/2022 0000   MCV 87.7 01/31/2020 0727   MCH 30.0 03/18/2016 0244   MCHC 34.3 01/31/2020 0727   RDW 13.9 01/31/2020 0727   LYMPHSABS 1.5 01/31/2020 0727   MONOABS 0.5 01/31/2020 0727   EOSABS 0.3 01/31/2020 0727   BASOSABS 0.1 01/31/2020 0727    BMET    Component Value Date/Time   NA 139  03/31/2022 0000   K 3.7 03/31/2022 0000   CL 101 03/31/2022 0000   CO2 24 (A) 03/31/2022 0000   GLUCOSE 95 02/04/2022 1356   BUN 31 (A) 03/31/2022 0000   CREATININE 2.6 (A) 03/31/2022 0000   CREATININE 2.59 (H) 02/04/2022 1356   CALCIUM 9.5 03/31/2022 0000   GFRNONAA 59 (L) 03/19/2016 0340   GFRAA >60 03/19/2016 0340    BNP    Component Value Date/Time   BNP 15.9 03/17/2016 0930     Imaging:  US RENAL  Result Date: 04/05/2022 CLINICAL DATA:  Chronic renal disease. EXAM: RENAL / URINARY TRACT ULTRASOUND COMPLETE COMPARISON:  None Available. FINDINGS: Right Kidney: Renal measurements: 12.4 x 4.8 x 5.0 cm = volume: 158 mL. Diffuse increased cortical echogenicity. Left Kidney: Renal measurements: 11.5 x 5.8 x 5.9 cm = volume: 207 mL. Diffuse increased echogenicity. Bladder: Appears normal for degree of bladder distention. Other: None. IMPRESSION: 1. Diffuse increased echogenicity in both kidneys is consistent with  medical renal disease. No acute abnormalities identified. Electronically Signed   By: Dorise Bullion III M.D.   On: 04/05/2022 17:05          No data to display          No results found for: "NITRICOXIDE"      Assessment & Plan:   Nocturnal hypoxemia HST with very mild OSA; however, had significant drops in his O2 levels with average of 87% and SpO2 low 72%. No known etiology that would contribute to this. Recommended we evaluate further with split night study. Pt agreeable to this. We were able to get him scheduled for tomorrow night 6/14.   Patient Instructions  Split night study ordered - someone will contact you for scheduling   Follow up with Dr. Elsworth Soho or Joellen Jersey Janney Priego,NP after split night study. If symptoms do not improve or worsen, please contact office for sooner follow up or seek emergency care.    OSA (obstructive sleep apnea) Very mild with AHI 5.8/h. Concern regarding nocturnal hypoxemia - split night ordered for further eval.   Essential  hypertension Hypertensive at visit; however, he had not taken his antihypertensives until he was sitting in the exam room. Advised that he recheck when he gets home and notify his PCP if he is still above 140/90. Upon review, his HTN was well-controlled at his most recent primary care visit.    I spent 28 minutes of dedicated to the care of this patient on the date of this encounter to include pre-visit review of records, face-to-face time with the patient discussing conditions above, post visit ordering of testing, clinical documentation with the electronic health record, making appropriate referrals as documented, and communicating necessary findings to members of the patients care team.  Clayton Bibles, NP 05/03/2022  Pt aware and understands NP's role.

## 2022-05-04 ENCOUNTER — Ambulatory Visit (HOSPITAL_BASED_OUTPATIENT_CLINIC_OR_DEPARTMENT_OTHER): Payer: BC Managed Care – PPO | Attending: Nurse Practitioner | Admitting: Pulmonary Disease

## 2022-05-04 DIAGNOSIS — G4734 Idiopathic sleep related nonobstructive alveolar hypoventilation: Secondary | ICD-10-CM | POA: Diagnosis not present

## 2022-05-04 DIAGNOSIS — I10 Essential (primary) hypertension: Secondary | ICD-10-CM | POA: Insufficient documentation

## 2022-05-04 DIAGNOSIS — R0683 Snoring: Secondary | ICD-10-CM | POA: Insufficient documentation

## 2022-05-04 DIAGNOSIS — G4733 Obstructive sleep apnea (adult) (pediatric): Secondary | ICD-10-CM | POA: Insufficient documentation

## 2022-05-05 DIAGNOSIS — G4734 Idiopathic sleep related nonobstructive alveolar hypoventilation: Secondary | ICD-10-CM | POA: Diagnosis not present

## 2022-05-05 NOTE — Procedures (Signed)
Patient Name: Taylor Herrera, Taylor Herrera Date: 05/04/2022 Gender: Male D.O.B: 1976/07/07 Age (years): 47 Referring Provider: Clayton Bibles NP Height (inches): 74 Interpreting Physician: Chesley Mires MD, ABSM Weight (lbs): 230 RPSGT: Jorge Ny BMI: 30 MRN: [id number] Neck Size: 18.00  CLINICAL INFORMATION Sleep Study Type: Split Night CPAP  Indication for sleep study: Hypertension, OSA, Snoring, Witnessed Apneas  Epworth Sleepiness Score: 4  SLEEP STUDY TECHNIQUE As per the AASM Manual for the Scoring of Sleep and Associated Events v2.3 (April 2016) with a hypopnea requiring 4% desaturations.  The channels recorded and monitored were frontal, central and occipital EEG, electrooculogram (EOG), submentalis EMG (chin), nasal and oral airflow, thoracic and abdominal wall motion, anterior tibialis EMG, snore microphone, electrocardiogram, and pulse oximetry. Continuous positive airway pressure (CPAP) was initiated when the patient met split night criteria and was titrated according to treat sleep-disordered breathing.  MEDICATIONS Medications self-administered by patient taken the night of the study : N/A  RESPIRATORY PARAMETERS Diagnostic  Total AHI (/hr): 16.4 RDI (/hr): 16.7 OA Index (/hr): - CA Index (/hr): 0.0 REM AHI (/hr): 31.9 NREM AHI (/hr): 10.4 Supine AHI (/hr): 16.4 Non-supine AHI (/hr): 0 Min O2 Sat (%): 72.0 Mean O2 (%): 90.8 Time below 88% (min): 14.5   Titration  Optimal Pressure (cm):  AHI at Optimal Pressure (/hr): N/A Min O2 at Optimal Pressure (%): 93.0 Supine % at Optimal (%): N/A Sleep % at Optimal (%): N/A   He was tried on CPAP 6 to 8 cm H2O.  He had difficulty with sleep initiation and sleep maintenance while on CPAP.  As such he did not have adequate sleep time on CPAP to make an accurate assessment regarding optimal pressure setting.  SLEEP ARCHITECTURE The recording time for the entire night was 418 minutes.  During a baseline  period of 176.6 minutes, the patient slept for 168.5 minutes in REM and nonREM, yielding a sleep efficiency of 95.4%%. Sleep onset after lights out was 5.2 minutes with a REM latency of 58.0 minutes. The patient spent 1.5%% of the night in stage N1 sleep, 57.9%% in stage N2 sleep, 12.8%% in stage N3 and 27.9% in REM.  During the titration period of 233.7 minutes, the patient slept for 42.0 minutes in REM and nonREM, yielding a sleep efficiency of 18.0%%. Sleep onset after CPAP initiation was 167.9 minutes with a REM latency of N/A minutes. The patient spent 40.5%% of the night in stage N1 sleep, 59.5%% in stage N2 sleep, 0.0%% in stage N3 and 0% in REM.  CARDIAC DATA The 2 lead EKG demonstrated sinus rhythm. The mean heart rate was 100.0 beats per minute. Other EKG findings include: PVCs.  LEG MOVEMENT DATA The total Periodic Limb Movements of Sleep (PLMS) were 0. The PLMS index was 0.0 .  IMPRESSIONS - Moderate obstructive sleep apnea with an AHI of 16.4 and SpO2 low of 72%. - Sub-optimal titration portion of study due to difficulty with sleep initiation and sleep maintenance after applying CPAP therapy.  DIAGNOSIS - Obstructive Sleep Apnea (G47.33)  RECOMMENDATIONS - Additional therapies include CPAP, oral appliance, or surgical assessment.  If CPAP is considered, then options for set up would be auto CPAP 5 to 15 cm H2O or arrange for full night CPAP titration study. - Avoid alcohol, sedatives and other CNS depressants that may worsen sleep apnea and disrupt normal sleep architecture. - Sleep hygiene should be reviewed to assess factors that may improve sleep quality. - Weight management and regular exercise should  be initiated or continued.  [Electronically signed] 05/05/2022 02:20 PM  Chesley Mires MD, ABSM Diplomate, American Board of Sleep Medicine NPI: [id number]  [place] PH: [phone]   FX: [phone] [place]

## 2022-05-06 ENCOUNTER — Telehealth: Payer: Self-pay | Admitting: Nurse Practitioner

## 2022-05-06 DIAGNOSIS — G4733 Obstructive sleep apnea (adult) (pediatric): Secondary | ICD-10-CM

## 2022-05-12 ENCOUNTER — Other Ambulatory Visit: Payer: Self-pay | Admitting: Family

## 2022-05-19 NOTE — Telephone Encounter (Signed)
Called and spoke with patient. He verbalized understanding of results. He wishes to proceed with the cpap machine. I explained to him the process of getting the cpap as well as expecting a call from the DME to go over the details. He does not have a preference of the DME company as long as they are in network with his insurance. She verbalized understanding.   Order has been placed.   Nothing further needed at time of call.

## 2022-05-23 ENCOUNTER — Ambulatory Visit: Payer: BC Managed Care – PPO | Admitting: Nurse Practitioner

## 2022-06-02 ENCOUNTER — Ambulatory Visit: Payer: BC Managed Care – PPO | Admitting: Nurse Practitioner

## 2022-06-26 ENCOUNTER — Other Ambulatory Visit: Payer: Self-pay

## 2022-06-26 ENCOUNTER — Emergency Department (HOSPITAL_BASED_OUTPATIENT_CLINIC_OR_DEPARTMENT_OTHER): Payer: BC Managed Care – PPO

## 2022-06-26 ENCOUNTER — Encounter (HOSPITAL_BASED_OUTPATIENT_CLINIC_OR_DEPARTMENT_OTHER): Payer: Self-pay | Admitting: Emergency Medicine

## 2022-06-26 ENCOUNTER — Emergency Department (HOSPITAL_BASED_OUTPATIENT_CLINIC_OR_DEPARTMENT_OTHER)
Admission: EM | Admit: 2022-06-26 | Discharge: 2022-06-26 | Disposition: A | Discharge status: Home / Self Care | Payer: BC Managed Care – PPO | Attending: Emergency Medicine | Admitting: Emergency Medicine

## 2022-06-26 DIAGNOSIS — Y93I9 Activity, other involving external motion: Secondary | ICD-10-CM | POA: Diagnosis not present

## 2022-06-26 DIAGNOSIS — M7989 Other specified soft tissue disorders: Secondary | ICD-10-CM | POA: Diagnosis not present

## 2022-06-26 DIAGNOSIS — M25531 Pain in right wrist: Secondary | ICD-10-CM | POA: Insufficient documentation

## 2022-06-26 DIAGNOSIS — N189 Chronic kidney disease, unspecified: Secondary | ICD-10-CM | POA: Diagnosis not present

## 2022-06-26 HISTORY — DX: Disorder of kidney and ureter, unspecified: N28.9

## 2022-06-26 MED ORDER — PREDNISONE 20 MG PO TABS: 40.0000 mg | ORAL_TABLET | Freq: Every day | ORAL | 0 refills | Status: DC

## 2022-06-26 MED ORDER — ACETAMINOPHEN 500 MG PO TABS
1000.0000 mg | ORAL_TABLET | Freq: Once | ORAL | Status: AC
  Administered 2022-06-26: 1000 mg via ORAL
  Filled 2022-06-26: qty 2

## 2022-06-26 MED ORDER — PREDNISONE 50 MG PO TABS
60.0000 mg | ORAL_TABLET | Freq: Once | ORAL | Status: AC
  Administered 2022-06-26: 60 mg via ORAL
  Filled 2022-06-26: qty 1

## 2022-06-26 NOTE — Discharge Instructions (Addendum)
You were seen in the emergency department for right wrist swelling and pain.  Your x-ray showed an age-indeterminate fracture of your scaphoid.  There was also some inflammatory changes in this area.  Please use the splint, ice the area.  Tylenol for pain.  We are prescribing you a few days of steroids for possible gout flare.  Contact Dr. Frazier Butt and surgery for outpatient follow-up.  Return if any worsening or concerning symptoms

## 2022-06-26 NOTE — ED Provider Notes (Signed)
[facility] EMERGENCY DEPARTMENT Provider Note   CSN: [id number] Arrival date & time: 06/26/22  7628     History  Chief Complaint  Patient presents with   Wrist Pain    Taylor Herrera is a 46 y.o. male.  He is here with a complaint of right wrist pain for 3 days.  Right-hand-dominant works as a Naval architect.  No known trauma.  Worse with any type of movement.  Is also swollen.  He said he has had problems with his joints before, that they become inflamed at times.  Never diagnosed with gout.  No fevers or chills.  The history is provided by the patient.  Wrist Pain This is a new problem. The current episode started more than 2 days ago. The problem occurs constantly. The problem has not changed since onset.Pertinent negatives include no chest pain, no abdominal pain, no headaches and no shortness of breath. The symptoms are aggravated by bending and twisting. Nothing relieves the symptoms. He has tried rest for the symptoms. The treatment provided no relief.       Home Medications Prior to Admission medications   Medication Sig Start Date End Date Taking? Authorizing Provider  carvedilol (COREG) 25 MG tablet TAKE 1 TABLET (25 MG TOTAL) BY MOUTH TWICE A DAY WITH MEALS 05/12/22   Sandford Craze, NP  Cholecalciferol (HM VITAMIN D3) 100 MCG (4000 UT) CAPS Take 1 tablet by mouth once daily. 04/25/22   Sandford Craze, NP  cloNIDine (CATAPRES) 0.1 MG tablet Take 1 tablet (0.1 mg total) by mouth 2 (two) times daily. 02/04/22   Sandford Craze, NP  escitalopram (LEXAPRO) 10 MG tablet Take 1 tablet (10 mg total) by mouth at bedtime. 1/2 tab by mouth once daily for 1 week, then increase to a full tab once daily on week two. 03/07/22   Sandford Craze, NP  Olmesartan-amLODIPine-HCTZ 40-10-25 MG TABS Take 1 tablet by mouth daily. 02/04/22   Sandford Craze, NP      Allergies    Patient has no known allergies.    Review of Systems   Review of Systems   Constitutional:  Negative for fever.  Respiratory:  Negative for shortness of breath.   Cardiovascular:  Negative for chest pain.  Gastrointestinal:  Negative for abdominal pain.  Musculoskeletal:  Positive for joint swelling.  Skin:  Negative for rash and wound.  Neurological:  Negative for headaches.    Physical Exam Updated Vital Signs BP (!) 171/111 (BP Location: Right Arm) Comment: PT has high BP and has not be taking meds  Pulse 73   Temp 98.1 F (36.7 C) (Oral)   Resp 18   Wt 104.3 kg   SpO2 98%   BMI 29.53 kg/m  Physical Exam Vitals and nursing note reviewed.  Constitutional:      Appearance: Normal appearance. He is well-developed.  HENT:     Head: Normocephalic and atraumatic.  Eyes:     Conjunctiva/sclera: Conjunctivae normal.  Pulmonary:     Effort: Pulmonary effort is normal.  Musculoskeletal:        General: Swelling and tenderness present.     Cervical back: Neck supple.     Comments: Right upper extremity shoulder elbow and hand nontender.  He has moderate swelling on the lateral side of the dorsum of his right wrist with exquisite tenderness.  Limited range of motion of wrist due to pain.  Distal pulses motor and sensation intact.  No overlying erythema or open wounds no warmth.  Skin:  General: Skin is warm and dry.     Capillary Refill: Capillary refill takes less than 2 seconds.  Neurological:     General: No focal deficit present.     Mental Status: He is alert.     GCS: GCS eye subscore is 4. GCS verbal subscore is 5. GCS motor subscore is 6.     Sensory: No sensory deficit.     Motor: No weakness.     ED Results / Procedures / Treatments   Labs (all labs ordered are listed, but only abnormal results are displayed) Labs Reviewed - No data to display  EKG None  Radiology DG Wrist Complete Right  Result Date: 06/26/2022 CLINICAL DATA:  Wrist pain.  No known injury. EXAM: RIGHT WRIST - COMPLETE 3+ VIEW COMPARISON:  None Available.  FINDINGS: There is mild soft tissue swelling identified. Age indeterminate fracture deformity is noted involving the proximal pole of the scaphoid bone. The proximal fracture fragment appears well corticated suggesting sequelae of an old injury. However, there is a cortical lucency involving the radial styloid which may reflect underlying bone erosion. No additional acute fracture or dislocation. IMPRESSION: 1. Age indeterminate fracture deformity involving the proximal pole of the scaphoid bone. 2. Cortical lucency involving the radial styloid may reflect underlying bone erosion. Correlate for any clinical signs or symptoms of inflammatory arthropathy. Electronically Signed   By: Signa Kell M.D.   On: 06/26/2022 09:04    Procedures Procedures    Medications Ordered in ED Medications  acetaminophen (TYLENOL) tablet 1,000 mg (has no administration in time range)  predniSONE (DELTASONE) tablet 60 mg (has no administration in time range)    ED Course/ Medical Decision Making/ A&P Clinical Course as of 06/26/22 1704  Sun Jun 26, 2022  0901 X-ray of right wrist showing possible abnormal scaphoid.  Awaiting radiology reading. [MB]  0911 X-ray showing age-indeterminate scaphoid fracture and some inflammatory changes around the radial styloid.  Patient does recall injuring it back in college.  No recent trauma.  Cannot exclude gout cannot use NSAIDs we will put him on some steroids.  He will continue Tylenol and will provide wrist splint.  We will give contact information to follow-up outpatient with hand surgery.  Patient agreeable to plan. [MB]    Clinical Course User Index [MB] Terrilee Files, MD                           Medical Decision Making Amount and/or Complexity of Data Reviewed Radiology: ordered.  Risk OTC drugs. Prescription drug management.   Differential diagnosis includes fracture, sprain, dislocation, subluxation, arthritis including septic joint, gout, osteoarthritis.   X-rays ordered interpreted by me as degenerative changes around scaphoid questionable fracture.  Patient is otherwise nontoxic-appearing, doubt septic wrist.  Will place in splint and have him use steroids.  Patient has a history of CKD and creatinine in 2.5 last time was checked so we will hold off on NSAIDs.  Given contact information for hand surgery follow-up outpatient.  Return instructions discussed        Final Clinical Impression(s) / ED Diagnoses Final diagnoses:  Acute pain of right wrist    Rx / DC Orders ED Discharge Orders          Ordered    predniSONE (DELTASONE) 20 MG tablet  Daily        06/26/22 0912              Charm Barges,  Kayleen Memos, MD 06/26/22 [phone]

## 2022-06-26 NOTE — ED Notes (Signed)
X-ray at bedside

## 2022-06-26 NOTE — ED Triage Notes (Signed)
Pt drove to ED, c/o RT wrist pain x 3 days, denies injury.

## 2022-07-16 ENCOUNTER — Other Ambulatory Visit: Payer: Self-pay | Admitting: Family

## 2022-08-02 ENCOUNTER — Other Ambulatory Visit: Payer: Self-pay | Admitting: Family

## 2022-08-04 DIAGNOSIS — F419 Anxiety disorder, unspecified: Secondary | ICD-10-CM | POA: Diagnosis not present

## 2022-08-04 DIAGNOSIS — F32A Depression, unspecified: Secondary | ICD-10-CM | POA: Diagnosis not present

## 2022-08-04 DIAGNOSIS — G4733 Obstructive sleep apnea (adult) (pediatric): Secondary | ICD-10-CM | POA: Diagnosis not present

## 2022-08-04 DIAGNOSIS — M79671 Pain in right foot: Secondary | ICD-10-CM | POA: Diagnosis not present

## 2022-08-04 DIAGNOSIS — M778 Other enthesopathies, not elsewhere classified: Secondary | ICD-10-CM | POA: Diagnosis not present

## 2022-08-04 DIAGNOSIS — L089 Local infection of the skin and subcutaneous tissue, unspecified: Secondary | ICD-10-CM | POA: Diagnosis not present

## 2022-08-04 DIAGNOSIS — A419 Sepsis, unspecified organism: Secondary | ICD-10-CM | POA: Insufficient documentation

## 2022-08-04 DIAGNOSIS — M7989 Other specified soft tissue disorders: Secondary | ICD-10-CM | POA: Diagnosis not present

## 2022-08-04 DIAGNOSIS — R609 Edema, unspecified: Secondary | ICD-10-CM | POA: Diagnosis not present

## 2022-08-04 DIAGNOSIS — N1832 Chronic kidney disease, stage 3b: Secondary | ICD-10-CM | POA: Diagnosis not present

## 2022-08-04 DIAGNOSIS — D72829 Elevated white blood cell count, unspecified: Secondary | ICD-10-CM | POA: Diagnosis not present

## 2022-08-04 DIAGNOSIS — L03115 Cellulitis of right lower limb: Secondary | ICD-10-CM | POA: Diagnosis not present

## 2022-08-04 DIAGNOSIS — E559 Vitamin D deficiency, unspecified: Secondary | ICD-10-CM | POA: Diagnosis not present

## 2022-08-04 DIAGNOSIS — M71571 Other bursitis, not elsewhere classified, right ankle and foot: Secondary | ICD-10-CM | POA: Diagnosis not present

## 2022-08-04 DIAGNOSIS — I129 Hypertensive chronic kidney disease with stage 1 through stage 4 chronic kidney disease, or unspecified chronic kidney disease: Secondary | ICD-10-CM | POA: Diagnosis not present

## 2022-08-04 DIAGNOSIS — M779 Enthesopathy, unspecified: Secondary | ICD-10-CM | POA: Diagnosis not present

## 2022-08-04 DIAGNOSIS — G5761 Lesion of plantar nerve, right lower limb: Secondary | ICD-10-CM | POA: Diagnosis not present

## 2022-08-04 DIAGNOSIS — R600 Localized edema: Secondary | ICD-10-CM | POA: Diagnosis not present

## 2022-08-04 HISTORY — DX: Sepsis, unspecified organism: A41.9

## 2022-08-08 ENCOUNTER — Telehealth: Payer: Self-pay | Admitting: Family

## 2022-08-08 ENCOUNTER — Telehealth: Payer: Self-pay

## 2022-08-08 NOTE — Telephone Encounter (Signed)
Pt scheduled for tmr.

## 2022-08-08 NOTE — Telephone Encounter (Signed)
Transition Care Management Unsuccessful Follow-up Telephone Call  Date of discharge and from where:  [facility] 08-06-22 Dx: Sepsis  Attempts:  1st Attempt  Reason for unsuccessful TCM follow-up call:  Left voice message   Transition Care Management Unsuccessful Follow-up Telephone Call  Date of discharge and from where:  [facility] 08-06-22 Dx: Sepsis  Attempts:  2nd Attempt  Reason for unsuccessful TCM follow-up call:  Left voice message

## 2022-08-08 NOTE — Telephone Encounter (Signed)
Please contact pt to schedule post hospital follow up visit.

## 2022-08-09 ENCOUNTER — Ambulatory Visit: Payer: BC Managed Care – PPO | Admitting: Family

## 2022-08-09 VITALS — BP 103/64 | HR 72 | Temp 98.4°F | Resp 16 | Wt 239.0 lb

## 2022-08-09 DIAGNOSIS — E559 Vitamin D deficiency, unspecified: Secondary | ICD-10-CM

## 2022-08-09 DIAGNOSIS — L0390 Cellulitis, unspecified: Secondary | ICD-10-CM | POA: Diagnosis not present

## 2022-08-09 DIAGNOSIS — N1832 Chronic kidney disease, stage 3b: Secondary | ICD-10-CM | POA: Diagnosis not present

## 2022-08-09 DIAGNOSIS — R739 Hyperglycemia, unspecified: Secondary | ICD-10-CM

## 2022-08-09 DIAGNOSIS — E790 Hyperuricemia without signs of inflammatory arthritis and tophaceous disease: Secondary | ICD-10-CM | POA: Insufficient documentation

## 2022-08-09 DIAGNOSIS — I10 Essential (primary) hypertension: Secondary | ICD-10-CM

## 2022-08-09 DIAGNOSIS — M109 Gout, unspecified: Secondary | ICD-10-CM | POA: Insufficient documentation

## 2022-08-09 LAB — CBC WITH DIFFERENTIAL/PLATELET
Basophils Absolute: 0.1 10*3/uL (ref 0.0–0.1)
Basophils Relative: 0.5 % (ref 0.0–3.0)
Eosinophils Absolute: 0.2 10*3/uL (ref 0.0–0.7)
Eosinophils Relative: 1.2 % (ref 0.0–5.0)
HCT: 33.7 % — ABNORMAL LOW (ref 39.0–52.0)
Hemoglobin: 11.4 g/dL — ABNORMAL LOW (ref 13.0–17.0)
Lymphocytes Relative: 7.9 % — ABNORMAL LOW (ref 12.0–46.0)
Lymphs Abs: 1 10*3/uL (ref 0.7–4.0)
MCHC: 33.8 g/dL (ref 30.0–36.0)
MCV: 87 fl (ref 78.0–100.0)
Monocytes Absolute: 1.2 10*3/uL — ABNORMAL HIGH (ref 0.1–1.0)
Monocytes Relative: 9.9 % (ref 3.0–12.0)
Neutro Abs: 9.8 10*3/uL — ABNORMAL HIGH (ref 1.4–7.7)
Neutrophils Relative %: 80.5 % — ABNORMAL HIGH (ref 43.0–77.0)
Platelets: 361 10*3/uL (ref 150.0–400.0)
RBC: 3.87 Mil/uL — ABNORMAL LOW (ref 4.22–5.81)
RDW: 13.3 % (ref 11.5–15.5)
WBC: 12.2 10*3/uL — ABNORMAL HIGH (ref 4.0–10.5)

## 2022-08-09 LAB — COMPREHENSIVE METABOLIC PANEL
ALT: 34 U/L (ref 0–53)
AST: 26 U/L (ref 0–37)
Albumin: 3.5 g/dL (ref 3.5–5.2)
Alkaline Phosphatase: 107 U/L (ref 39–117)
BUN: 34 mg/dL — ABNORMAL HIGH (ref 6–23)
CO2: 26 mEq/L (ref 19–32)
Calcium: 9.4 mg/dL (ref 8.4–10.5)
Chloride: 94 mEq/L — ABNORMAL LOW (ref 96–112)
Creatinine, Ser: 2.62 mg/dL — ABNORMAL HIGH (ref 0.40–1.50)
GFR: 28.96 mL/min — ABNORMAL LOW (ref >60.00)
Glucose, Bld: 108 mg/dL — ABNORMAL HIGH (ref 70–99)
Potassium: 4 mEq/L (ref 3.5–5.1)
Sodium: 131 mEq/L — ABNORMAL LOW (ref 135–145)
Total Bilirubin: 0.5 mg/dL (ref 0.2–1.2)
Total Protein: 7.2 g/dL (ref 6.0–8.3)

## 2022-08-09 LAB — VITAMIN D 25 HYDROXY (VIT D DEFICIENCY, FRACTURES): VITD: 18.01 ng/mL — ABNORMAL LOW (ref 30.00–100.00)

## 2022-08-09 LAB — HEMOGLOBIN A1C: Hgb A1c MFr Bld: 5.9 % (ref 4.6–6.5)

## 2022-08-09 LAB — URIC ACID: Uric Acid, Serum: 10 mg/dL — ABNORMAL HIGH (ref 4.0–7.8)

## 2022-08-09 NOTE — Assessment & Plan Note (Signed)
Elevated glucose noted during hospitalization. Will check A1C.

## 2022-08-09 NOTE — Assessment & Plan Note (Signed)
Clinically resolved.  Advised him to complete his doxy/amoxicillin provided at discharge.

## 2022-08-09 NOTE — Assessment & Plan Note (Signed)
BP looks good today. Continue current medication.

## 2022-08-09 NOTE — Progress Notes (Signed)
Subjective:   By signing my name below, I, Carylon Perches, attest that this documentation has been prepared under the direction and in the presence of [place], NP 08/09/2022   Patient ID: Taylor Herrera, male    DOB: [birth year], 46 y.o.   MRN: [id number]  Chief Complaint  Patient presents with   Hospitalization Follow-up    Here for hospital follow up    HPI Patient is in today for a hospital follow-up   ED Follow-up: He was admitted to the ED on 08/04/2022 for right foot pain and right foot swelling. He states that it started with an infection in his right foot and the area was swollen. He was not able to walk at the time. He was admitted to [facility] for 2 days and discharged on a 5 day course of doxycycline and amoxicillin. He reports that no swelling is present at this time in his right foot and he is feeling much better.  His blood pressure during his time in the hospital was elevated which he contributes to not the hospital team changing his bp meds.   After discharge, he has since taken his medications. His blood pressure at the time of this visit is normal. His significant other reports that the night before last, the patient had a low grade fever but as of today, the fever is not present. He is also taking vitamin D supplements.  BP Readings from Last 3 Encounters:  08/09/22 103/64  06/26/22 (!) 171/111  05/03/22 (!) 160/110   Pulse Readings from Last 3 Encounters:  08/09/22 72  06/26/22 73  05/03/22 66   Swollen Vein (Right Hand): Vein in right hand is currently swollen. Believes that symptoms might be due to IV placement in his hand. He reports that the area is improving.  Health Maintenance Due  Topic Date Due   COVID-19 Vaccine (1) Never done   INFLUENZA VACCINE  Never done    Past Medical History:  Diagnosis Date   Essential hypertension 09/16/2014   History of alcohol abuse 08/01/2018   History of kidney stones    HTN (hypertension) 04/23/2015    Hypertension    Migraine    Preventative health care 04/23/2015   Renal disorder     No past surgical history on file.  Family History  Problem Relation Age of Onset   Cancer Mother        multiple myeloma, ovarian   Hypertension Mother    Hypertension Father    Arrhythmia Father    Kidney disease Father        ESRD on HD   Hypertension Sister     Social History   Socioeconomic History   Marital status: Married    Spouse name: Not on file   Number of children: Not on file   Years of education: Not on file   Highest education level: Not on file  Occupational History   Not on file  Tobacco Use   Smoking status: Never   Smokeless tobacco: Never  Substance and Sexual Activity   Alcohol use: Not Currently    Comment: occasional   Drug use: No   Sexual activity: Not on file  Other Topics Concern   Not on file  Social History Narrative   Driver for [employer]   Married- two kids (one son and one daughter) both healthy   Originally from [place]   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     carvedilol (COREG) 25 MG tablet TAKE 1 TABLET (25 MG TOTAL) BY MOUTH TWICE A DAY WITH MEALS 180 tablet 1   Cholecalciferol (HM VITAMIN D3) 100 MCG (4000 UT) CAPS Take 1 tablet by mouth once daily. 30 capsule    cloNIDine (CATAPRES) 0.1 MG tablet TAKE 1 TABLET BY MOUTH 2 TIMES DAILY. 180 tablet 1   escitalopram (LEXAPRO) 10 MG tablet TAKE 1 TABLET (10 MG TOTAL) BY MOUTH AT BEDTIME. 1/2 TAB BY MOUTH ONCE DAILY FOR 1 WEEK, THEN INCREASE TO A FULL TAB ONCE  DAILY ON WEEK TWO. 90 tablet 0   Olmesartan-amLODIPine-HCTZ 40-10-25 MG TABS TAKE 1 TABLET BY MOUTH EVERY DAY 90 tablet 1   predniSONE (DELTASONE) 20 MG tablet Take 2 tablets (40 mg total) by mouth daily. (Patient not taking: Reported on 08/09/2022) 8 tablet 0   No facility-administered medications prior to visit.    No Known Allergies  Review of Systems  Musculoskeletal:        (+) Swollen Vein (Right Hand)       Objective:    Physical Exam Constitutional:      General: He is not in acute distress.    Appearance: Normal appearance. He is not ill-appearing.  HENT:     Head: Normocephalic and atraumatic.     Right Ear: External ear normal.     Left Ear: External ear normal.  Eyes:     Extraocular Movements: Extraocular movements intact.     Pupils: Pupils are equal, round, and reactive to light.  Cardiovascular:     Rate and Rhythm: Normal rate and regular rhythm.     Heart sounds: Normal heart sounds. No murmur heard.    No gallop.  Pulmonary:     Effort: Pulmonary effort is normal. No respiratory distress.     Breath sounds: Normal breath sounds. No wheezing or rales.  Feet:     Right foot:     Skin integrity: No erythema.     Comments: No swelling or erythema right foot Skin:    General: Skin is warm and dry.     Comments: Trace swelling, mild erythema of right dorsal hand   Neurological:     Mental Status: He is alert and oriented to person, place, and time.  Psychiatric:        Mood and Affect: Mood normal.        Behavior: Behavior normal.        Judgment: Judgment normal.     BP 103/64 (BP Location: Right Arm, Patient Position: Sitting, Cuff Size: Large)   Pulse 72   Temp 98.4 F (36.9 C) (Oral)   Resp 16   Wt 239 lb (108.4 kg)   SpO2 100%   BMI 30.69 kg/m  Wt Readings from Last 3 Encounters:  08/09/22 239 lb (108.4 kg)  06/26/22 230 lb (104.3 kg)  05/04/22 230 lb (104.3 kg)       Assessment & Plan:   Problem List Items Addressed This Visit        Unprioritized   Vitamin D deficiency  Taking otc supplement. Will check follow up level.       Relevant Orders   Vitamin D (25 hydroxy)   Hyperglycemia    Elevated glucose noted during hospitalization. Will check A1C.       Relevant Orders   Hemoglobin A1c   Essential hypertension    BP looks good today. Continue current medication.       Elevated blood uric acid level - Primary   Relevant Orders   Uric acid   Cellulitis    Clinically resolved.  Advised him to complete his doxy/amoxicillin provided at discharge.       Relevant Orders   CBC with Differential/Platelet   Other Visit Diagnoses     Stage 3b chronic kidney disease ([place])       Relevant Orders   Comp Met (CMET)      No orders of the defined types were placed in this encounter.   I, Nance Pear, NP, personally preformed the services described in this documentation.  All medical record entries made by the scribe were at my direction and in my presence.  I have reviewed the chart and discharge instructions (if applicable) and agree that the record reflects my personal performance and is accurate and complete. 08/09/2022   I,Amber Collins,acting as a Education administrator for Nance Pear, NP.,have documented all relevant documentation on the behalf of Nance Pear, NP,as directed by  Nance Pear, NP while in the presence of Nance Pear, NP.    Nance Pear, NP

## 2022-08-09 NOTE — Assessment & Plan Note (Signed)
Taking otc supplement. Will check follow up level.

## 2022-08-10 ENCOUNTER — Telehealth: Payer: Self-pay | Admitting: Family

## 2022-08-10 DIAGNOSIS — D649 Anemia, unspecified: Secondary | ICD-10-CM

## 2022-08-10 MED ORDER — ALLOPURINOL 100 MG PO TABS: 50.0000 mg | ORAL_TABLET | ORAL | 1 refills | Status: DC

## 2022-08-10 MED ORDER — VITAMIN D (ERGOCALCIFEROL) 1.25 MG (50000 UNIT) PO CAPS: 50000.0000 [IU] | ORAL_CAPSULE | ORAL | 0 refills | Status: DC

## 2022-08-10 NOTE — Telephone Encounter (Signed)
Uric acid level is high.  I would like for him to add allopurinol 50mg  one tab every other day to try to lower his uric acid an prevent gout flares.   He is anemic, this could be related to his kidneys but I would like him to complete an IFOB to make sure that he is not losing any blood through the GI tract.  White blood cell count is mildly elevated but has come down compared to last level at the hospital.    Vitamin D level is low.  Advise patient to begin vit D 50000 units once weekly for 12 weeks, then repeat vit D level (dx Vit D deficiency).

## 2022-08-10 NOTE — Telephone Encounter (Signed)
All results discussed with patient in detail. Provider's comments and recommendations given to him. He will pick up allopurinol and Vitamin D at pharmacy. He will pick up IFOB here at office.

## 2022-10-24 ENCOUNTER — Ambulatory Visit: Payer: BC Managed Care – PPO | Admitting: Family

## 2022-11-01 ENCOUNTER — Ambulatory Visit: Payer: BC Managed Care – PPO | Admitting: Family

## 2022-11-08 ENCOUNTER — Ambulatory Visit (INDEPENDENT_AMBULATORY_CARE_PROVIDER_SITE_OTHER): Payer: BC Managed Care – PPO | Admitting: Family

## 2022-11-08 VITALS — BP 121/88 | HR 65 | Temp 97.5°F | Resp 16 | Ht 74.0 in | Wt 251.0 lb

## 2022-11-08 DIAGNOSIS — N2581 Secondary hyperparathyroidism of renal origin: Secondary | ICD-10-CM | POA: Diagnosis not present

## 2022-11-08 DIAGNOSIS — F1011 Alcohol abuse, in remission: Secondary | ICD-10-CM

## 2022-11-08 DIAGNOSIS — D631 Anemia in chronic kidney disease: Secondary | ICD-10-CM | POA: Diagnosis not present

## 2022-11-08 DIAGNOSIS — I129 Hypertensive chronic kidney disease with stage 1 through stage 4 chronic kidney disease, or unspecified chronic kidney disease: Secondary | ICD-10-CM | POA: Diagnosis not present

## 2022-11-08 DIAGNOSIS — G4733 Obstructive sleep apnea (adult) (pediatric): Secondary | ICD-10-CM | POA: Diagnosis not present

## 2022-11-08 DIAGNOSIS — N1832 Chronic kidney disease, stage 3b: Secondary | ICD-10-CM | POA: Diagnosis not present

## 2022-11-08 DIAGNOSIS — I10 Essential (primary) hypertension: Secondary | ICD-10-CM | POA: Diagnosis not present

## 2022-11-08 DIAGNOSIS — E559 Vitamin D deficiency, unspecified: Secondary | ICD-10-CM

## 2022-11-08 DIAGNOSIS — F32A Depression, unspecified: Secondary | ICD-10-CM | POA: Diagnosis not present

## 2022-11-08 LAB — BASIC METABOLIC PANEL
BUN: 37 mg/dL — ABNORMAL HIGH (ref 6–23)
CO2: 29 mEq/L (ref 19–32)
Calcium: 9.3 mg/dL (ref 8.4–10.5)
Chloride: 100 mEq/L (ref 96–112)
Creatinine, Ser: 2.41 mg/dL — ABNORMAL HIGH (ref 0.40–1.50)
GFR: 31.96 mL/min — ABNORMAL LOW (ref >60.00)
Glucose, Bld: 104 mg/dL — ABNORMAL HIGH (ref 70–99)
Potassium: 4 mEq/L (ref 3.5–5.1)
Sodium: 137 mEq/L (ref 135–145)

## 2022-11-08 LAB — VITAMIN D 25 HYDROXY (VIT D DEFICIENCY, FRACTURES): VITD: 40.39 ng/mL (ref 30.00–100.00)

## 2022-11-08 MED ORDER — CARVEDILOL 25 MG PO TABS: ORAL_TABLET | ORAL | 1 refills | Status: DC

## 2022-11-08 MED ORDER — CLONIDINE HCL 0.1 MG PO TABS: 0.1000 mg | ORAL_TABLET | Freq: Two times a day (BID) | ORAL | 1 refills | Status: DC

## 2022-11-08 MED ORDER — OLMESARTAN-AMLODIPINE-HCTZ 40-10-25 MG PO TABS: 1.0000 | ORAL_TABLET | Freq: Every day | ORAL | 1 refills | Status: DC

## 2022-11-08 NOTE — Assessment & Plan Note (Signed)
>>  ASSESSMENT AND PLAN FOR HISTORY OF ALCOHOL ABUSE WRITTEN ON 11/08/2022 11:01 AM BY O'SULLIVAN, Salsabeel Gorelick, NP  Pt remains alcohol free.  I commended him on this.

## 2022-11-08 NOTE — Assessment & Plan Note (Signed)
Pt remains alcohol free.  I commended him on this.

## 2022-11-08 NOTE — Assessment & Plan Note (Signed)
Completed 12 week course of vit D 50000 iu weekly. Recheck level.

## 2022-11-08 NOTE — Progress Notes (Signed)
Subjective:   By signing my name below, I, Carylon Perches, attest that this documentation has been prepared under the direction and in the presence of Karie Chimera, NP 11/08/2022   Patient ID: Taylor Herrera, male    DOB: Jul 29, 1976, 46 y.o.   MRN: [id number]  Chief Complaint  Patient presents with   Hypertension    Here for follow up    HPI Patient is in today for an office visit  Blood Pressure: As of today's visit, his blood pressure is normal.  BP Readings from Last 3 Encounters:  11/08/22 121/88  08/09/22 103/64  06/26/22 (!) 171/111   Pulse Readings from Last 3 Encounters:  11/08/22 65  08/09/22 72  06/26/22 73   Vitamin D: He is currently taking the weekly 1.25 mg of Drisdol.   Mood: He reports that his mood is stable and is currently taking 10 mg of Lexapro.   Alcohol: He reports that he is no longer drinking alcohol.   Sleep Studied: He states that he is waiting to get fitted for a Cpap.  Immunizations: He is not interested in receiving the Influenza or Covid vaccine during today's visit.   There are no preventive care reminders to display for this patient.   Past Medical History:  Diagnosis Date   Essential hypertension 09/16/2014   History of alcohol abuse 08/01/2018   History of kidney stones    HTN (hypertension) 04/23/2015   Hypertension    Migraine    Preventative health care 04/23/2015   Renal disorder     No past surgical history on file.  Family History  Problem Relation Age of Onset   Cancer Mother        multiple myeloma, ovarian   Hypertension Mother    Hypertension Father    Arrhythmia Father    Kidney disease Father        ESRD on HD   Hypertension Sister     Social History   Socioeconomic History   Marital status: Married    Spouse name: Not on file   Number of children: Not on file   Years of education: Not on file   Highest education level: Not on file  Occupational History   Not on file  Tobacco Use    Smoking status: Never   Smokeless tobacco: Never  Substance and Sexual Activity   Alcohol use: Not Currently    Comment: occasional   Drug use: No   Sexual activity: Not on file  Other Topics Concern   Not on file  Social History Narrative   Driver for [employer]   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 21 tablet 1   Cholecalciferol (HM VITAMIN D3) 100 MCG (4000 UT) CAPS Take 1 tablet by mouth once daily. 30 capsule    escitalopram (LEXAPRO) 10 MG tablet TAKE 1 TABLET (10 MG TOTAL) BY MOUTH AT BEDTIME. 1/2 TAB  BY MOUTH ONCE DAILY FOR 1 WEEK, THEN INCREASE TO A FULL TAB ONCE DAILY ON WEEK TWO. 90 tablet 0   carvedilol (COREG) 25 MG tablet TAKE 1 TABLET (25 MG TOTAL) BY MOUTH TWICE A DAY WITH MEALS 180 tablet 1   cloNIDine (CATAPRES) 0.1 MG tablet TAKE 1 TABLET BY MOUTH 2 TIMES DAILY. 180 tablet 1   Olmesartan-amLODIPine-HCTZ 40-10-25 MG TABS TAKE 1 TABLET BY MOUTH EVERY DAY 90 tablet 1   Vitamin D, Ergocalciferol, (DRISDOL) 1.25 MG (50000 UNIT) CAPS capsule Take 1 capsule (50,000 Units total) by mouth every 7 (seven) days. 12 capsule 0   No facility-administered medications prior to visit.    No Known Allergies  ROS See HPI    Objective:    Physical Exam Constitutional:      General: He is not in acute distress.    Appearance: Normal appearance. He is not ill-appearing.  HENT:     Head: Normocephalic and atraumatic.     Right Ear:  External ear normal.     Left Ear: External ear normal.  Eyes:     Extraocular Movements: Extraocular movements intact.     Pupils: Pupils are equal, round, and reactive to light.  Cardiovascular:     Rate and Rhythm: Normal rate and regular rhythm.     Heart sounds: Normal heart sounds. No murmur heard.    No gallop.  Pulmonary:     Effort: Pulmonary effort is normal. No respiratory distress.     Breath sounds: Normal breath sounds. No wheezing or rales.  Musculoskeletal:     Right lower leg: No edema.     Left lower leg: No edema.  Skin:    General: Skin is warm and dry.  Neurological:     Mental Status: He is alert and oriented to person, place, and time.  Psychiatric:        Mood and Affect: Mood normal.        Behavior: Behavior normal.        Judgment: Judgment normal.     BP 121/88 (BP Location: Right Arm, Patient Position: Sitting, Cuff Size: Large)   Pulse 65   Temp (!) 97.5 F (36.4 C) (Oral)   Resp 16   Ht _0  (1.88 m)   Wt 251 lb (113.9 kg)   SpO2 100%   BMI 32.23 kg/m  Wt Readings from Last 3 Encounters:  11/08/22 251 lb (113.9 kg)  08/09/22 239 lb (108.4 kg)  06/26/22 230 lb (104.3 kg)       Assessment & Plan:   Problem List Items Addressed This Visit       Unprioritized   Vitamin D deficiency - Primary    Completed 12 week course of vit D 50000 iu weekly. Recheck level.       Relevant Orders   Vitamin D (25 hydroxy)   OSA (obstructive sleep apnea)    States he plans to be fitted for a cpap after the new year when he gets his new insurance.       History of alcohol abuse    Pt remains alcohol free.  I commended him on this.       Essential hypertension   Relevant Medications   Olmesartan-amLODIPine-HCTZ 40-10-25 MG TABS   cloNIDine (CATAPRES) 0.1 MG tablet   carvedilol (COREG) 25 MG tablet   Other Relevant Orders   Basic Metabolic Panel (BMET)   Depression    Mood is stable on lexapro. Continue same.  Meds ordered this  encounter  Medications   Olmesartan-amLODIPine-HCTZ 40-10-25 MG TABS    Sig: Take 1 tablet by mouth daily.    Dispense:  90 tablet    Refill:  1    Order Specific Question:   Supervising Provider    Answer:   Penni Homans A [[id number]]   cloNIDine (CATAPRES) 0.1 MG tablet    Sig: Take 1 tablet (0.1 mg total) by mouth 2 (two) times daily.    Dispense:  180 tablet    Refill:  1    Order Specific Question:   Supervising Provider    Answer:   Penni Homans A [[id number]]   carvedilol (COREG) 25 MG tablet    Sig: TAKE 1 TABLET (25 MG TOTAL) BY MOUTH TWICE A DAY WITH MEALS    Dispense:  180 tablet    Refill:  1    Order Specific Question:   Supervising Provider    Answer:   Penni Homans A [[id number]]    I, Nance Pear, NP, personally preformed the services described in this documentation.  All medical record entries made by the scribe were at my direction and in my presence.  I have reviewed the chart and discharge instructions (if applicable) and agree that the record reflects my personal performance and is accurate and complete. 11/08/2022   I,Amber Collins,acting as a scribe for Nance Pear, NP.,have documented all relevant documentation on the behalf of Nance Pear, NP,as directed by  Nance Pear, NP while in the presence of Nance Pear, NP.    Nance Pear, NP

## 2022-11-08 NOTE — Assessment & Plan Note (Signed)
Mood is stable on lexapro. Continue same.

## 2022-11-08 NOTE — Assessment & Plan Note (Signed)
States he plans to be fitted for a cpap after the new year when he gets his new insurance.

## 2022-11-24 DIAGNOSIS — G4733 Obstructive sleep apnea (adult) (pediatric): Secondary | ICD-10-CM | POA: Diagnosis not present

## 2023-02-07 ENCOUNTER — Encounter: Payer: BC Managed Care – PPO | Admitting: Family

## 2023-02-14 ENCOUNTER — Encounter: Payer: Self-pay | Admitting: Family

## 2023-02-14 ENCOUNTER — Ambulatory Visit (INDEPENDENT_AMBULATORY_CARE_PROVIDER_SITE_OTHER): Payer: Managed Care, Other (non HMO) | Admitting: Family

## 2023-02-14 VITALS — BP 116/74 | HR 74 | Temp 98.4°F | Resp 16 | Ht 74.0 in | Wt 254.0 lb

## 2023-02-14 DIAGNOSIS — Z0000 Encounter for general adult medical examination without abnormal findings: Secondary | ICD-10-CM | POA: Diagnosis not present

## 2023-02-14 DIAGNOSIS — M109 Gout, unspecified: Secondary | ICD-10-CM

## 2023-02-14 DIAGNOSIS — Z3009 Encounter for other general counseling and advice on contraception: Secondary | ICD-10-CM

## 2023-02-14 DIAGNOSIS — I10 Essential (primary) hypertension: Secondary | ICD-10-CM

## 2023-02-14 DIAGNOSIS — E559 Vitamin D deficiency, unspecified: Secondary | ICD-10-CM | POA: Diagnosis not present

## 2023-02-14 DIAGNOSIS — R739 Hyperglycemia, unspecified: Secondary | ICD-10-CM

## 2023-02-14 MED ORDER — ESCITALOPRAM OXALATE 10 MG PO TABS: 10.0000 mg | ORAL_TABLET | Freq: Every day | ORAL | 1 refills | Status: DC

## 2023-02-14 MED ORDER — ALLOPURINOL 100 MG PO TABS: 50.0000 mg | ORAL_TABLET | ORAL | 4 refills | Status: DC

## 2023-02-14 MED ORDER — OLMESARTAN-AMLODIPINE-HCTZ 40-10-25 MG PO TABS: 1.0000 | ORAL_TABLET | Freq: Every day | ORAL | 0 refills | Status: DC

## 2023-02-14 MED ORDER — CARVEDILOL 25 MG PO TABS: ORAL_TABLET | ORAL | 0 refills | Status: DC

## 2023-02-14 MED ORDER — CLONIDINE HCL 0.1 MG PO TABS: 0.1000 mg | ORAL_TABLET | Freq: Two times a day (BID) | ORAL | 1 refills | Status: DC

## 2023-02-14 NOTE — Assessment & Plan Note (Signed)
Obtain A1C. 

## 2023-02-14 NOTE — Assessment & Plan Note (Signed)
Encouraged healthy diet, exercise, weight loss. Plan colo at age 47.  Recommended that he get covid booster at his pharmacy.

## 2023-02-14 NOTE — Assessment & Plan Note (Signed)
BP Readings from Last 3 Encounters:  02/14/23 116/74  11/08/22 121/88  08/09/22 103/64   BP looks great. Continue tribenzor, carvedilol, catapres.

## 2023-02-14 NOTE — Assessment & Plan Note (Signed)
On otc supplement. Check follow up level.

## 2023-02-14 NOTE — Assessment & Plan Note (Signed)
Stable on QOD allopurinol.

## 2023-02-14 NOTE — Progress Notes (Signed)
Subjective:   By signing my name below, I, Madelin Rear, attest that this documentation has been prepared under the direction and in the presence of [name], NP.  02/14/2023.   Patient ID: Taylor Herrera, male    DOB: June 22, 1976, 47 y.o.   MRN: [id number]  Chief Complaint  Patient presents with   Annual Exam    Reports pain on his feet    HPI Patient is in today for a comprehensive physical exam.  Refills:  He requests refills of his medications today, including allopurinol.  Foot pain:  He complains of intermittent pain alternating between the tops of his bilateral feet. Typically his pain has a gradual onset. For instance, he describes that if the foot pain begins on a Monday, it gradually waxes until it peaks on Wednesday. Afterwards his pain wanes until it resolves spontaneously. Initially, he felt his symptoms may be related to the boots he wears. However, he has worn boots for years without this pain.  Hypertension:  His blood pressure is well controlled in clinic today. Occasionally he will monitor his BP at home. BP Readings from Last 3 Encounters:  02/14/23 116/74  11/08/22 121/88  08/09/22 103/64   Mood:  Generally he is feeling stable on 10 mg Lexapro.  Social history:  No changes to his family medical history. No alcohol consumption. No drug use. No tobacco use or vaping use. Male partner.  Colonoscopy:  We will plan for a colonoscopy after he turns 47 years old.  PSA:  Last completed 01/31/2020 (0.23).  Immunizations: He states his most recent Covid-19 and influenza vaccinations were in the Fall of 2022.  Diet/Exercise:  He admits that he needs to do better with his diet and exercise routines.  Denies having any fever, new moles, congestion, sinus pain, sore throat, chest pain, palpitations, cough, SOB, wheezing, n/v/d, constipation, blood in stool, dysuria, frequency, hematuria, at this time.  Past Medical History:  Diagnosis Date   Essential  hypertension 09/16/2014   History of alcohol abuse 08/01/2018   History of kidney stones    HTN (hypertension) 04/23/2015   Hypertension    Migraine    Preventative health care 04/23/2015   Renal disorder     History reviewed. No pertinent surgical history.  Family History  Problem Relation Age of Onset   Cancer Mother        multiple myeloma, ovarian   Hypertension Mother    Hypertension Father    Arrhythmia Father    Kidney disease Father        ESRD on HD   Hypertension Sister     Social History   Socioeconomic History   Marital status: Married    Spouse name: Not on file   Number of children: Not on file   Years of education: Not on file   Highest education level: Not on file  Occupational History   Not on file  Tobacco Use   Smoking status: Never   Smokeless tobacco: Never  Substance and Sexual Activity   Alcohol use: Not Currently    Comment: none currently   Drug use: No   Sexual activity: Yes    Partners: Female  Other Topics Concern   Not on file  Social History Narrative   Driver for [employer]   Married- two kids (one son and one daughter) both healthy   Originally from [place]   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 30 capsule    allopurinol (ZYLOPRIM) 100 MG tablet Take 0.5 tablets (50 mg total) by mouth every other day. 21 tablet 1   carvedilol (COREG) 25 MG tablet TAKE 1 TABLET (25 MG TOTAL) BY MOUTH TWICE A DAY WITH MEALS 180 tablet 1   cloNIDine (CATAPRES) 0.1 MG tablet Take 1 tablet (0.1  mg total) by mouth 2 (two) times daily. 180 tablet 1   escitalopram (LEXAPRO) 10 MG tablet TAKE 1 TABLET (10 MG TOTAL) BY MOUTH AT BEDTIME. 1/2 TAB BY MOUTH ONCE DAILY FOR 1 WEEK, THEN INCREASE TO A FULL TAB ONCE DAILY ON WEEK TWO. 90 tablet 0   Olmesartan-amLODIPine-HCTZ 40-10-25 MG TABS Take 1 tablet by mouth daily. 90 tablet 1   No facility-administered medications prior to visit.    No Known Allergies  Review of Systems  Constitutional:  Negative for fever.  HENT:  Negative for congestion, sinus pain and sore throat.   Respiratory:  Negative for cough, shortness of breath and wheezing.   Cardiovascular:  Negative for chest pain and palpitations.  Gastrointestinal:  Negative for blood in stool, constipation, diarrhea, nausea and vomiting.  Genitourinary:  Negative for dysuria, frequency and hematuria.  Musculoskeletal:  Positive for myalgias (Bilateral foot pain).  Skin:        (-) New moles.       Objective:    Physical Exam Constitutional:      General: He is not in acute distress.    Appearance: Normal appearance. He is not ill-appearing.  HENT:     Head: Normocephalic and atraumatic.     Right Ear: Tympanic membrane, ear canal and external ear normal.     Left Ear: Tympanic membrane, ear canal and external ear normal.  Eyes:     Extraocular Movements: Extraocular movements intact.     Pupils: Pupils are equal, round, and reactive to light.  Cardiovascular:     Rate and Rhythm: Normal rate and regular rhythm.     Heart sounds: Normal heart sounds. No murmur heard.    No gallop.  Pulmonary:     Effort: Pulmonary effort is normal. No respiratory distress.     Breath sounds: Normal breath sounds. No wheezing or rales.  Abdominal:     General: Bowel sounds are normal. There is no distension.     Palpations: Abdomen is soft.     Tenderness: There is no abdominal tenderness. There is no guarding.  Musculoskeletal:        General: Normal range of motion.     Comments:  5/5 muscle strength of upper and lower extremities bilaterally.  Skin:    General: Skin is warm and dry.  Neurological:     General: No focal deficit present.     Mental Status: He is alert and oriented to person, place, and time.     Deep Tendon Reflexes:     Reflex Scores:      Patellar reflexes are 2+ on the right side and 2+ on the left side. Psychiatric:        Mood and Affect:  Mood normal.        Behavior: Behavior normal.     BP 116/74 (BP Location: Right Arm, Patient Position: Sitting, Cuff Size: Large)   Pulse 74   Temp 98.4 F (36.9 C) (Oral)   Resp 16   Ht 6\' 2"  (1.88 m)   Wt 254 lb (115.2 kg)   SpO2 100%   BMI 32.61 kg/m  Wt Readings from Last 3 Encounters:  02/14/23 254 lb (115.2 kg)  11/08/22 251 lb (113.9 kg)  08/09/22 239 lb (108.4 kg)      Assessment & Plan:   Problem List Items Addressed This Visit       Unprioritized   Vitamin D deficiency    On otc supplement. Check follow up level.      Relevant Orders   Vitamin D (25 hydroxy)   Preventative health care - Primary    Encouraged healthy diet, exercise, weight loss. Plan colo at age 44.  Recommended that he get covid booster at his pharmacy.       Relevant Orders   TSH   CBC w/Diff   Hyperglycemia    Obtain A1C.       Relevant Orders   HgB A1c   Gout    Stable on QOD allopurinol.       Essential hypertension    BP Readings from Last 3 Encounters:  02/14/23 116/74  11/08/22 121/88  08/09/22 103/64  BP looks great. Continue tribenzor, carvedilol, catapres.       Relevant Medications   cloNIDine (CATAPRES) 0.1 MG tablet   carvedilol (COREG) 25 MG tablet   Olmesartan-amLODIPine-HCTZ 40-10-25 MG TABS   Other Relevant Orders   Comp Met (CMET)   Lipid panel   Other Visit Diagnoses     Vasectomy evaluation       Relevant Orders   Ambulatory referral to Urology        Meds ordered this encounter  Medications   escitalopram (LEXAPRO) 10 MG tablet    Sig: Take 1 tablet  (10 mg total) by mouth daily.    Dispense:  90 tablet    Refill:  1    Order Specific Question:   Supervising Provider    Answer:   Penni Homans A [[id number]]   allopurinol (ZYLOPRIM) 100 MG tablet    Sig: Take 0.5 tablets (50 mg total) by mouth every other day.    Dispense:  21 tablet    Refill:  4    Order Specific Question:   Supervising Provider    Answer:   Penni Homans A [[id number]]   cloNIDine (CATAPRES) 0.1 MG tablet    Sig: Take 1 tablet (0.1 mg total) by mouth 2 (two) times daily.    Dispense:  180 tablet    Refill:  1    Order Specific Question:   Supervising Provider    Answer:   Penni Homans A [[id number]]   carvedilol (COREG) 25 MG tablet    Sig: TAKE 1 TABLET (25 MG TOTAL) BY MOUTH TWICE A DAY WITH MEALS    Dispense:  180 tablet    Refill:  0    Order Specific Question:   Supervising Provider    Answer:   Penni Homans A [[id number]]   Olmesartan-amLODIPine-HCTZ 40-10-25 MG TABS    Sig: Take 1 tablet by mouth daily.    Dispense:  90 tablet    Refill:  0    Order Specific Question:   Supervising Provider    Answer:  BLYTH, STACEY A [[id number]]    I, Nance Pear, NP, personally preformed the services described in this documentation.  All medical record entries made by the scribe were at my direction and in my presence.  I have reviewed the chart and discharge instructions (if applicable) and agree that the record reflects my personal performance and is accurate and complete. 02/14/2023.  I,Mathew Stumpf,acting as a Education administrator for [employer], NP.,have documented all relevant documentation on the behalf of Nance Pear, NP,as directed by  Nance Pear, NP while in the presence of Nance Pear, NP.   Nance Pear, NP

## 2023-02-15 ENCOUNTER — Encounter: Payer: Self-pay | Admitting: Family

## 2023-02-15 LAB — COMPREHENSIVE METABOLIC PANEL
ALT: 18 U/L (ref 0–53)
AST: 19 U/L (ref 0–37)
Albumin: 4.1 g/dL (ref 3.5–5.2)
Alkaline Phosphatase: 124 U/L — ABNORMAL HIGH (ref 39–117)
BUN: 54 mg/dL — ABNORMAL HIGH (ref 6–23)
CO2: 27 mEq/L (ref 19–32)
Calcium: 9.2 mg/dL (ref 8.4–10.5)
Chloride: 102 mEq/L (ref 96–112)
Creatinine, Ser: 2.71 mg/dL — ABNORMAL HIGH (ref 0.40–1.50)
GFR: 27.71 mL/min — ABNORMAL LOW (ref >60.00)
Glucose, Bld: 99 mg/dL (ref 70–99)
Potassium: 4.2 mEq/L (ref 3.5–5.1)
Sodium: 137 mEq/L (ref 135–145)
Total Bilirubin: 0.4 mg/dL (ref 0.2–1.2)
Total Protein: 7.3 g/dL (ref 6.0–8.3)

## 2023-02-15 LAB — LIPID PANEL
Cholesterol: 173 mg/dL (ref 0–200)
HDL: 51.3 mg/dL (ref >39.00)
NonHDL: 121.41
Total CHOL/HDL Ratio: 3
Triglycerides: 205 mg/dL — ABNORMAL HIGH (ref 0.0–149.0)
VLDL: 41 mg/dL — ABNORMAL HIGH (ref 0.0–40.0)

## 2023-02-15 LAB — TSH: TSH: 0.7 u[IU]/mL (ref 0.35–5.50)

## 2023-02-15 LAB — CBC WITH DIFFERENTIAL/PLATELET
Basophils Absolute: 0 10*3/uL (ref 0.0–0.1)
Basophils Relative: 0.5 % (ref 0.0–3.0)
Eosinophils Absolute: 0.2 10*3/uL (ref 0.0–0.7)
Eosinophils Relative: 2.8 % (ref 0.0–5.0)
HCT: 36.4 % — ABNORMAL LOW (ref 39.0–52.0)
Hemoglobin: 12.3 g/dL — ABNORMAL LOW (ref 13.0–17.0)
Lymphocytes Relative: 13.1 % (ref 12.0–46.0)
Lymphs Abs: 1.1 10*3/uL (ref 0.7–4.0)
MCHC: 33.8 g/dL (ref 30.0–36.0)
MCV: 88 fl (ref 78.0–100.0)
Monocytes Absolute: 0.6 10*3/uL (ref 0.1–1.0)
Monocytes Relative: 7.2 % (ref 3.0–12.0)
Neutro Abs: 6.5 10*3/uL (ref 1.4–7.7)
Neutrophils Relative %: 76.4 % (ref 43.0–77.0)
Platelets: 343 10*3/uL (ref 150.0–400.0)
RBC: 4.14 Mil/uL — ABNORMAL LOW (ref 4.22–5.81)
RDW: 13.6 % (ref 11.5–15.5)
WBC: 8.5 10*3/uL (ref 4.0–10.5)

## 2023-02-15 LAB — LDL CHOLESTEROL, DIRECT: Direct LDL: 100 mg/dL

## 2023-02-15 LAB — VITAMIN D 25 HYDROXY (VIT D DEFICIENCY, FRACTURES): VITD: 41.38 ng/mL (ref 30.00–100.00)

## 2023-02-15 LAB — HEMOGLOBIN A1C: Hgb A1c MFr Bld: 6 % (ref 4.6–6.5)

## 2023-02-16 ENCOUNTER — Other Ambulatory Visit: Payer: Self-pay | Admitting: *Deleted

## 2023-02-16 ENCOUNTER — Other Ambulatory Visit: Payer: Managed Care, Other (non HMO)

## 2023-02-16 DIAGNOSIS — D649 Anemia, unspecified: Secondary | ICD-10-CM

## 2023-02-16 LAB — IBC + FERRITIN
Ferritin: 95.9 ng/mL (ref 22.0–322.0)
Iron: 84 ug/dL (ref 42–165)
Saturation Ratios: 25.6 % (ref 20.0–50.0)
TIBC: 327.6 ug/dL (ref 250.0–450.0)
Transferrin: 234 mg/dL (ref 212.0–360.0)

## 2023-02-16 NOTE — Telephone Encounter (Signed)
Patient advised for is complete. He will pick up at front desk

## 2023-02-18 ENCOUNTER — Other Ambulatory Visit: Payer: Self-pay

## 2023-02-18 ENCOUNTER — Encounter (HOSPITAL_BASED_OUTPATIENT_CLINIC_OR_DEPARTMENT_OTHER): Payer: Self-pay | Admitting: Emergency Medicine

## 2023-02-18 ENCOUNTER — Encounter: Payer: Self-pay | Admitting: Family

## 2023-02-18 ENCOUNTER — Emergency Department (HOSPITAL_BASED_OUTPATIENT_CLINIC_OR_DEPARTMENT_OTHER)
Admission: EM | Admit: 2023-02-18 | Discharge: 2023-02-18 | Disposition: A | Discharge status: Home / Self Care | Payer: Managed Care, Other (non HMO) | Attending: Emergency Medicine | Admitting: Emergency Medicine

## 2023-02-18 DIAGNOSIS — M25531 Pain in right wrist: Secondary | ICD-10-CM

## 2023-02-18 DIAGNOSIS — N189 Chronic kidney disease, unspecified: Secondary | ICD-10-CM | POA: Diagnosis not present

## 2023-02-18 DIAGNOSIS — Z79899 Other long term (current) drug therapy: Secondary | ICD-10-CM | POA: Diagnosis not present

## 2023-02-18 DIAGNOSIS — M25431 Effusion, right wrist: Secondary | ICD-10-CM | POA: Insufficient documentation

## 2023-02-18 DIAGNOSIS — I129 Hypertensive chronic kidney disease with stage 1 through stage 4 chronic kidney disease, or unspecified chronic kidney disease: Secondary | ICD-10-CM | POA: Diagnosis not present

## 2023-02-18 MED ORDER — PREDNISONE 10 MG PO TABS: 20.0000 mg | ORAL_TABLET | Freq: Two times a day (BID) | ORAL | 1 refills | Status: DC

## 2023-02-18 MED ORDER — PREDNISONE 20 MG PO TABS
40.0000 mg | ORAL_TABLET | Freq: Once | ORAL | Status: AC
  Administered 2023-02-18: 40 mg via ORAL
  Filled 2023-02-18: qty 2

## 2023-02-18 MED ORDER — HYDROCODONE-ACETAMINOPHEN 5-325 MG PO TABS: 1.0000 | ORAL_TABLET | Freq: Four times a day (QID) | ORAL | 0 refills | Status: DC | PRN

## 2023-02-18 NOTE — ED Triage Notes (Signed)
Right wrist pain, was seen in August, and was told had an old fracture. States swelling in various joints happens randomly with unknown cause.

## 2023-02-18 NOTE — ED Provider Notes (Signed)
[facility] Provider Note   CSN: [id number] Arrival date & time: 02/18/23  0557     History  Chief Complaint  Patient presents with   Wrist Pain   Joint Swelling    Taylor Herrera is a 47 y.o. male.  Patient is a 47 year old male with history of hypertension and chronic renal insufficiency.  Patient presenting today with complaints of right wrist pain and swelling.  This has been worsening over the past 4 days.  This began in the absence of any injury or trauma.  He did have a similar episode back in August of this past year and was treated with prednisone which seemed to help.  He denies any fevers or chills.  The history is provided by the patient.       Home Medications Prior to Admission medications   Medication Sig Start Date End Date Taking? Authorizing Provider  allopurinol (ZYLOPRIM) 100 MG tablet Take 0.5 tablets (50 mg total) by mouth every other day. 02/14/23   [name], NP  carvedilol (COREG) 25 MG tablet TAKE 1 TABLET (25 MG TOTAL) BY MOUTH TWICE A DAY WITH MEALS 02/14/23   [name], NP  Cholecalciferol (HM VITAMIN D3) 100 MCG (4000 UT) CAPS Take 1 tablet by mouth once daily. 04/25/22   [name], NP  cloNIDine (CATAPRES) 0.1 MG tablet Take 1 tablet (0.1 mg total) by mouth 2 (two) times daily. 02/14/23   [name], NP  escitalopram (LEXAPRO) 10 MG tablet Take 1 tablet (10 mg total) by mouth daily. 02/14/23   [name], NP  Olmesartan-amLODIPine-HCTZ 40-10-25 MG TABS Take 1 tablet by mouth daily. 02/14/23   [name], NP      Allergies    Patient has no known allergies.    Review of Systems   Review of Systems  All other systems reviewed and are negative.   Physical Exam Updated Vital Signs BP (!) 157/108 (BP Location: Right Arm)   Pulse 77   Temp 99.2 F (37.3 C) (Oral)   Resp 20   Ht 6\' 2"  (1.88 m)   Wt 115 kg   SpO2 100%   BMI 32.55 kg/m   Physical Exam Vitals and nursing note reviewed.  Constitutional:      Appearance: Normal appearance.  HENT:     Head: Normocephalic and atraumatic.  Pulmonary:     Effort: Pulmonary effort is normal.  Musculoskeletal:     Comments: There is some swelling of the right wrist.  He has pain with any range of motion.  It is somewhat warm to the touch no overlying redness or erythema.  Skin:    General: Skin is warm and dry.  Neurological:     Mental Status: He is alert.     ED Results / Procedures / Treatments   Labs (all labs ordered are listed, but only abnormal results are displayed) Labs Reviewed - No data to display  EKG None  Radiology No results found.  Procedures Procedures    Medications Ordered in ED Medications  predniSONE (DELTASONE) tablet 40 mg (has no administration in time range)    ED Course/ Medical Decision Making/ A&P  Patient presenting with complaints of right wrist pain and swelling, the etiology of which I am highly suspicious is gout.  Patient will be treated with a wrist splint and prednisone and medication for pain.  If symptoms or not improving in the next few days, he is to follow-up with his primary  doctor to discuss the next steps, possibly a referral to rheumatology as he describes similar episodes in his foot and knee recently.  I have considered, but doubt a septic joint.  Final Clinical Impression(s) / ED Diagnoses Final diagnoses:  None    Rx / DC Orders ED Discharge Orders     None         Veryl Speak, MD 02/18/23 [phone]

## 2023-02-18 NOTE — Discharge Instructions (Signed)
Begin taking prednisone as prescribed.  Begin taking hydrocodone as prescribed as needed for pain.  Wear the wrist splint for comfort and support.  Follow-up with your primary doctor if not improving in the next few days.

## 2023-03-01 ENCOUNTER — Telehealth: Payer: Self-pay | Admitting: Urology

## 2023-03-01 ENCOUNTER — Ambulatory Visit: Payer: Managed Care, Other (non HMO) | Admitting: Urology

## 2023-03-01 ENCOUNTER — Encounter: Payer: Self-pay | Admitting: Urology

## 2023-03-01 VITALS — BP 157/111 | HR 96 | Ht 73.0 in | Wt 245.0 lb

## 2023-03-01 DIAGNOSIS — Z3009 Encounter for other general counseling and advice on contraception: Secondary | ICD-10-CM

## 2023-03-01 MED ORDER — ALPRAZOLAM 1 MG PO TABS: ORAL_TABLET | ORAL | 0 refills | Status: DC

## 2023-03-01 NOTE — Progress Notes (Signed)
Assessment: 1. Encounter for vasectomy assessment    Plan: Schedule for vasectomy per patient request Rx for alprazolam 1 mg pre-procedure provided.   Chief Complaint:  Chief Complaint  Patient presents with   VAS Consult    History of Present Illness:  Taylor Herrera is a 47 y.o. male who is seen for vasectomy evaluation. He is married with 4 children.  No history of scrotal trauma or infection.   Past Medical History:  Past Medical History:  Diagnosis Date   Essential hypertension 09/16/2014   History of alcohol abuse 08/01/2018   History of kidney stones    HTN (hypertension) 04/23/2015   Hypertension    Migraine    Preventative health care 04/23/2015   Renal disorder     Past Surgical History:  No past surgical history on file.  Allergies:  No Known Allergies  Family History:  Family History  Problem Relation Age of Onset   Cancer Mother        multiple myeloma, ovarian   Hypertension Mother    Hypertension Father    Arrhythmia Father    Kidney disease Father        ESRD on HD   Hypertension Sister     Social History:  Social History   Tobacco Use   Smoking status: Never   Smokeless tobacco: Never  Substance Use Topics   Alcohol use: Not Currently    Comment: none currently   Drug use: No    Review of symptoms:  Constitutional:  Negative for unexplained weight loss, night sweats, fever, chills ENT:  Negative for nose bleeds, sinus pain, painful swallowing CV:  Negative for chest pain, shortness of breath, exercise intolerance, palpitations, loss of consciousness Resp:  Negative for cough, wheezing, shortness of breath GI:  Negative for nausea, vomiting, diarrhea, bloody stools GU:  Positives noted in HPI; otherwise negative for gross hematuria, dysuria, urinary incontinence Neuro:  Negative for seizures, poor balance, limb weakness, slurred speech Psych:  Negative for lack of energy, depression, anxiety Endocrine:  Negative for  polydipsia, polyuria, symptoms of hypoglycemia (dizziness, hunger, sweating) Hematologic:  Negative for anemia, purpura, petechia, prolonged or excessive bleeding, use of anticoagulants  Allergic:  Negative for difficulty breathing or choking as a result of exposure to anything; no shellfish allergy; no allergic response (rash/itch) to materials, foods  Physical exam: BP (!) 157/111   Pulse 96   Ht 6\' 1"  (1.854 m)   Wt 245 lb (111.1 kg)   BMI 32.32 kg/m  GENERAL APPEARANCE:  Well appearing, well developed, well nourished, NAD HEENT:  Atraumatic, normocephalic, oropharynx clear NECK:  Supple without lymphadenopathy or thyromegaly ABDOMEN:  Soft, non-tender, no masses EXTREMITIES:  Moves all extremities well, without clubbing, cyanosis, or edema NEUROLOGIC:  Alert and oriented x 3, normal gait, CN II-XII grossly intact MENTAL STATUS:  appropriate BACK:  Non-tender to palpation, No CVAT SKIN:  Warm, dry, and intact GU: Penis:  circumcised Meatus: Normal Scrotum: Vasa palpated bilaterally Testis: normal without masses bilateral Epididymis: normal   Results: None  VASECTOMY CONSULTATION  Taylor Herrera presents for vasectomy consultation today.  He is a 47 y.o. male, Married with 4  children .  He and his wife have discussed the issues regarding long-term fertility and are comfortable with this decision.  He presents for consideration for vasectomy.  I discussed the issues in detail with him today and he expressed no reservations.  As to the procedure, no scalpel technique vasectomy is explained and reviewed in detail.  Generalized risks including but not limited to bleeding, infection, orchalgia, testicular atrophy, epididymitis, scrotal hematoma, and chronic pain are discussed.   Additionally, he understands that the possibility of vas recanalization following vasectomy is possible although rare.  Most importantly, the patient understands that he is not sterile initially and will  need a semen analysis check to confirm sterility such that no sperm are seen.  He is advised to avoid ejaculation for 10 days following the procedure.  The initial semen analysis will be checked in approximately 12 weeks and in some patients, several months may be required for clearance of all sperm.  He reports a clear understanding of the need for continued birth control until sterility is confirmed.  Otherwise, general issues regarding local anesthesia, prep, alprazolam are discussed and he reports a clear understanding.

## 2023-03-01 NOTE — Patient Instructions (Signed)
Taking care of yourself after a VASECTOMY                                              Patient Information Sheet        The following information will reinforce some of the instructions that your doctor has given you.  Day of Procedure: 1) Wear the scrotal supporter and gauze pad 2) Use an ice pack on the scrotum for 15 minutes every hour for 48 hours to help reduce discomfort, swelling and bruising (do NOT place ice directly on your skin, but place on top of the supporter) 3) Expect some clear to pinkish drainage at the surgical site for the first 24-48 hours 4) If needed, use pain medications provided or ibuprofen 800 mg every 8 hours for discomfort 5) Avoid strenuous activities like mowing, lifting, jogging and exercising for 1 week.  Take it easy! 6) If you develop a fever over 101 F or sudden onset of significant swelling within the first 12 hours, please call to report this to your doctor as soon as possible.     Day Two and Three: 1) You may take a shower, but avoid tubs, pools or hot tubs. 2) Continue to wear the scrotal supporter as needed for comfort and change or remove the gauze pad if desired 3) Keep taking it easy!  Avoid strenuous activities like mowing, lifting, jogging and exercising.   4) Continue to watch for signs or symptoms of fever or significant swelling 5) Apply a small amount of antibiotic ointment to incision 1-2 times/day  The rest of the week: 1) Gradually return to normal physical activities after one week.  A return of soreness might mean you are        "doing too much too soon". 2) Avoid sexual activity for 10 days after the procedure 3) Continue to take a shower, but avoid tubs, pools or hot tubs 4) Wearing the scrotal supporter is optional based on your comfort.     Remember to use an alternate form of contraception for 3 months until you have been checked and CLEARED by your urologist!  3-Month lab appointment:  1) The lab technician will need to  look at a semen sample under a microscope  2) Use the specimen cup provided to collect the sample AT HOME 1 hour before the appointment  3) DO NOT refrigerate the specimen, but keep at room or body temperature  4) Avoid ejaculation for 2-5 days before collecting the specimen  5) Collect the entire specimen by masturbation using NO lubricant  6) Make sure your name, MR number, date and time of collection are on the cup  

## 2023-03-01 NOTE — Telephone Encounter (Signed)
Patient has been scheduled for Vasectomy on 04/27/2023 with Dr Pete Glatter.

## 2023-04-27 ENCOUNTER — Encounter: Payer: Self-pay | Admitting: Urology

## 2023-04-27 ENCOUNTER — Ambulatory Visit: Payer: Managed Care, Other (non HMO) | Admitting: Urology

## 2023-04-27 VITALS — BP 158/104 | HR 76

## 2023-04-27 DIAGNOSIS — Z302 Encounter for sterilization: Secondary | ICD-10-CM

## 2023-04-27 MED ORDER — HYDROCODONE-ACETAMINOPHEN 5-325 MG PO TABS
1.0000 | ORAL_TABLET | Freq: Four times a day (QID) | ORAL | 0 refills | Status: DC | PRN
Start: 2023-04-27 — End: 2023-11-07

## 2023-04-27 MED ORDER — CEPHALEXIN 500 MG PO CAPS
500.0000 mg | ORAL_CAPSULE | Freq: Three times a day (TID) | ORAL | 0 refills | Status: AC
Start: 2023-04-27 — End: 2023-04-30

## 2023-04-27 NOTE — Patient Instructions (Signed)
Taking care of yourself after a VASECTOMY                                              Patient Information Sheet        The following information will reinforce some of the instructions that your doctor has given you.  Day of Procedure: 1) Wear the scrotal supporter and gauze pad 2) Use an ice pack on the scrotum for 15 minutes every hour for 48 hours to help reduce discomfort, swelling and bruising (do NOT place ice directly on your skin, but place on top of the supporter) 3) Expect some clear to pinkish drainage at the surgical site for the first 24-48 hours 4) If needed, use pain medications provided or ibuprofen 800 mg every 8 hours for discomfort 5) Avoid strenuous activities like mowing, lifting, jogging and exercising for 1 week.  Take it easy! 6) If you develop a fever over 101 F or sudden onset of significant swelling within the first 12 hours, please call to report this to your doctor as soon as possible.     Day Two and Three: 1) You may take a shower, but avoid tubs, pools or hot tubs. 2) Continue to wear the scrotal supporter as needed for comfort and change or remove the gauze pad if desired 3) Keep taking it easy!  Avoid strenuous activities like mowing, lifting, jogging and exercising.   4) Continue to watch for signs or symptoms of fever or significant swelling 5) Apply a small amount of antibiotic ointment to incision 1-2 times/day  The rest of the week: 1) Gradually return to normal physical activities after one week.  A return of soreness might mean you are        "doing too much too soon". 2) Avoid sexual activity for 10 days after the procedure 3) Continue to take a shower, but avoid tubs, pools or hot tubs 4) Wearing the scrotal supporter is optional based on your comfort.     Remember to use an alternate form of contraception for 3 months until you have been checked and CLEARED by your urologist!  3-Month lab appointment:  1) The lab technician will need to  look at a semen sample under a microscope  2) Use the specimen cup provided to collect the sample AT HOME 1 hour before the appointment  3) DO NOT refrigerate the specimen, but keep at room or body temperature  4) Avoid ejaculation for 2-5 days before collecting the specimen  5) Collect the entire specimen by masturbation using NO lubricant  6) Make sure your name, MR number, date and time of collection are on the cup  

## 2023-04-27 NOTE — Progress Notes (Signed)
   Assessment: 1. Encounter for vasectomy     Plan: Postvasectomy instructions provided. Prescription sent. Return to office in 12 weeks for postvasectomy semen analysis.  Chief Complaint:  Chief Complaint  Patient presents with   VAS    History of Present Illness:  Taylor Herrera is a 47 y.o. male who is seen for vasectomy.Marland Kitchen He is married with 4 children.  No history of scrotal trauma or infection.   Past Medical History:  Past Medical History:  Diagnosis Date   Essential hypertension 09/16/2014   History of alcohol abuse 08/01/2018   History of kidney stones    HTN (hypertension) 04/23/2015   Hypertension    Migraine    Preventative health care 04/23/2015   Renal disorder     Past Surgical History:  No past surgical history on file.  Allergies:  No Known Allergies  Family History:  Family History  Problem Relation Age of Onset   Cancer Mother        multiple myeloma, ovarian   Hypertension Mother    Hypertension Father    Arrhythmia Father    Kidney disease Father        ESRD on HD   Hypertension Sister     Social History:  Social History   Tobacco Use   Smoking status: Never   Smokeless tobacco: Never  Substance Use Topics   Alcohol use: Not Currently    Comment: none currently   Drug use: No    ROS: Constitutional:  Negative for fever, chills, weight loss CV: Negative for chest pain, previous MI, hypertension Respiratory:  Negative for shortness of breath, wheezing, sleep apnea, frequent cough GI:  Negative for nausea, vomiting, bloody stool, GERD  Physical exam: BP (!) 158/104   Pulse 76  GENERAL APPEARANCE:  Well appearing, well developed, well nourished, NAD HEENT:  Atraumatic, normocephalic, oropharynx clear NECK:  Supple without lymphadenopathy or thyromegaly ABDOMEN:  Soft, non-tender, no masses EXTREMITIES:  Moves all extremities well, without clubbing, cyanosis, or edema NEUROLOGIC:  Alert and oriented x 3, normal gait, CN  II-XII grossly intact MENTAL STATUS:  appropriate BACK:  Non-tender to palpation, No CVAT SKIN:  Warm, dry, and intact   Results: None  VASECTOMY PROCEDURE:  Taylor Herrera presents for vasectomy following previous vasectomy consultation and permit is signed.  The patient's anterior scrotal wall is shaved and prepped with Betadine in standard sterile fashion.  1% lidocaine is used as local anesthetic in the scrotal and peri vasal tissue.  A standard median raphe punch incision is made and a no scalpel technique vasectomy is performed.  Bilateral vas are isolated from the peri vasal tissue and an approximately 1 cm segment of vas is excised.  Proximal and distal segments are internally cauterized with electric heat cautery. Interposition of perivasal tissue was performed.  Bilateral palpation confirms bilateral vasectomy defect and no significant bleeding or hematoma is identified.  Neosporin gauze dressing and a scrotal support are applied.    Disposition: Patient is discharged home with Rx for pain medication and antibiotics.  Patient is given routine vasectomy instructions.   Most importantly, he is instructed and cautioned again regarding the need for protected intercourse until such time that a single  negative semen analysis has been obtained.  The initial semen analysis will be checked in approximately 12  weeks.  The patient reports a clear understanding.  He will call with any interval questions or concerns.

## 2023-05-25 DIAGNOSIS — G4733 Obstructive sleep apnea (adult) (pediatric): Secondary | ICD-10-CM | POA: Diagnosis not present

## 2023-06-15 IMAGING — US US RENAL
1 series · 14 of 25 positions shown · non-contrast
Comparison: None Available.

CLINICAL DATA: Chronic renal disease.

EXAM:
RENAL / URINARY TRACT ULTRASOUND COMPLETE

[Series 1: us renal · 0.26mm/px · 14 of 43 slices shown]
[im 1/43]
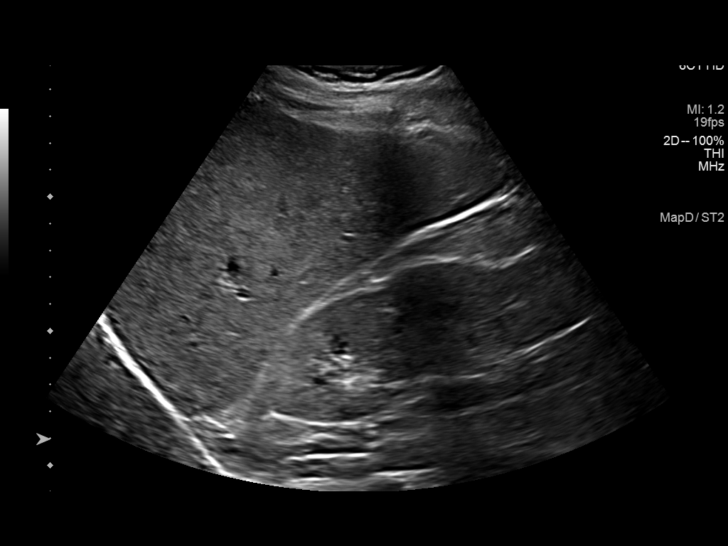
[im 4/43]
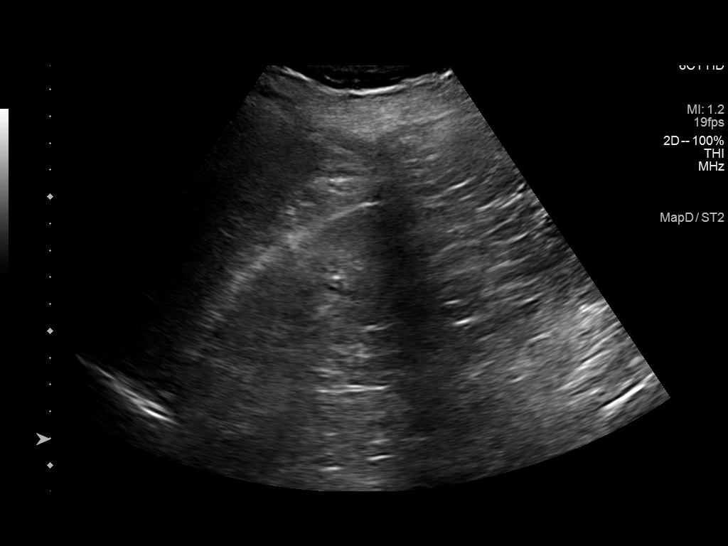
[im 8/43]
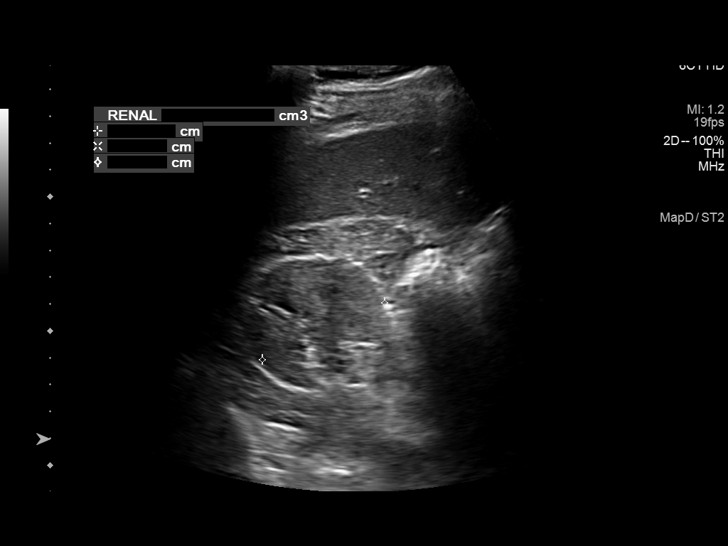
[im 11/43]
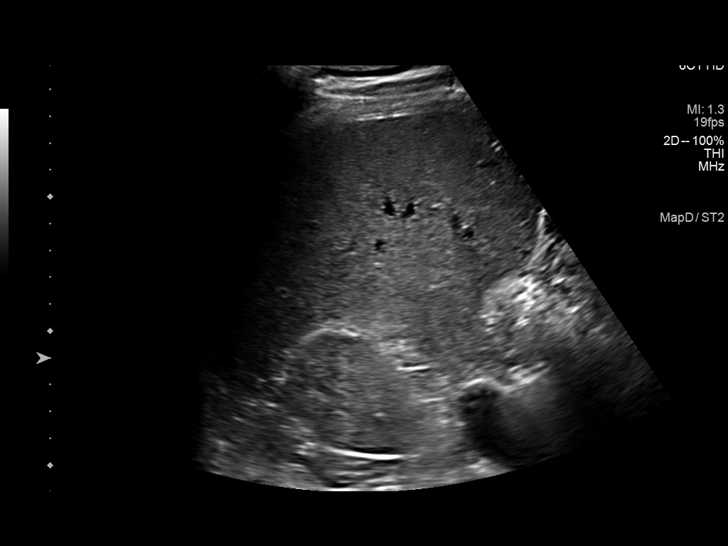
[im 15/43]
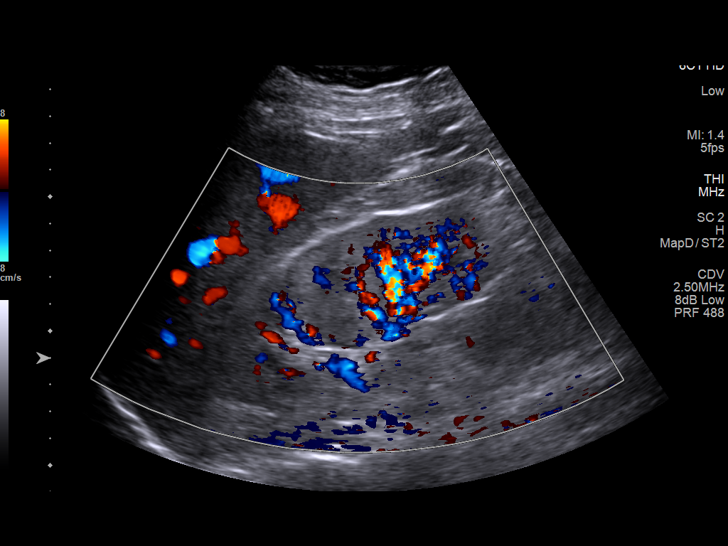
[im 16/43]
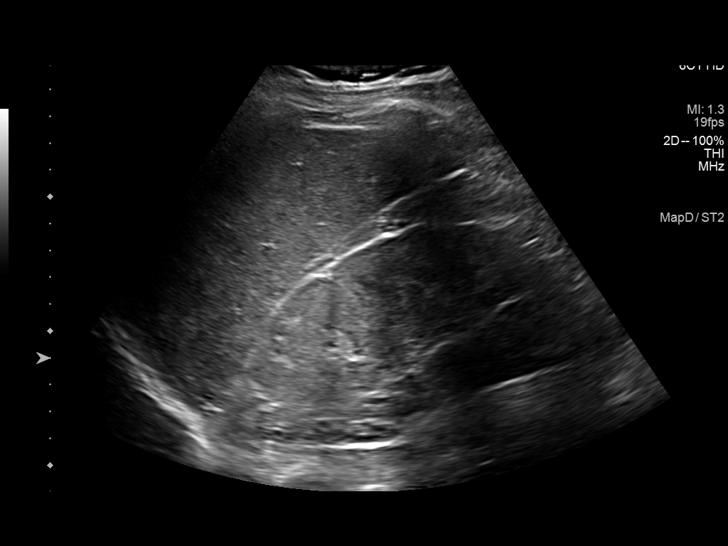
[im 20/43]
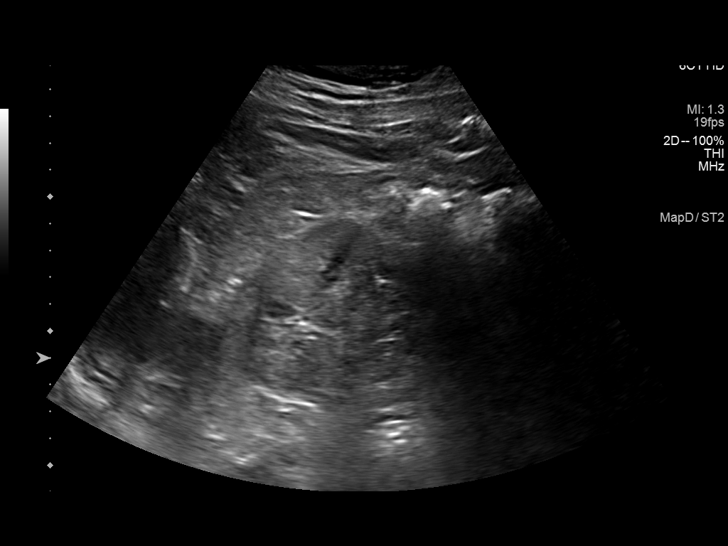
[im 23/43]
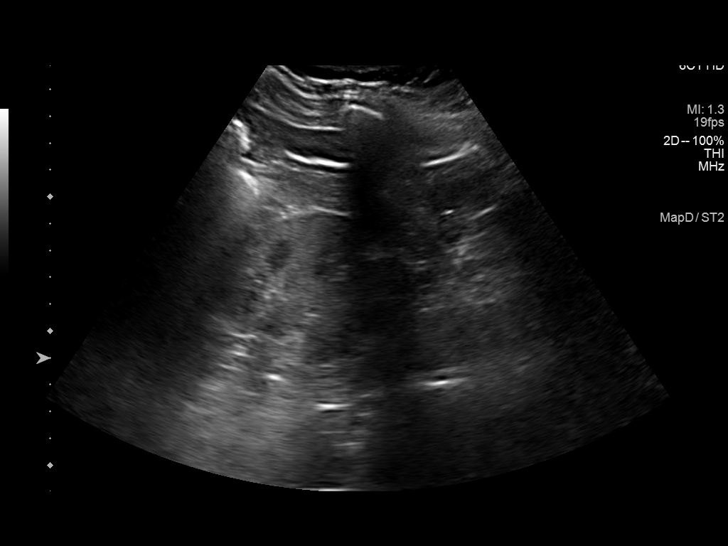
[im 27/43]
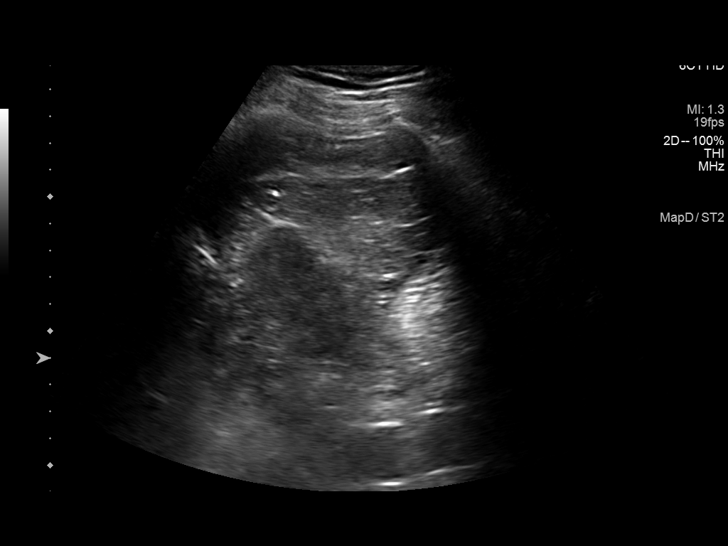
[im 29/43]
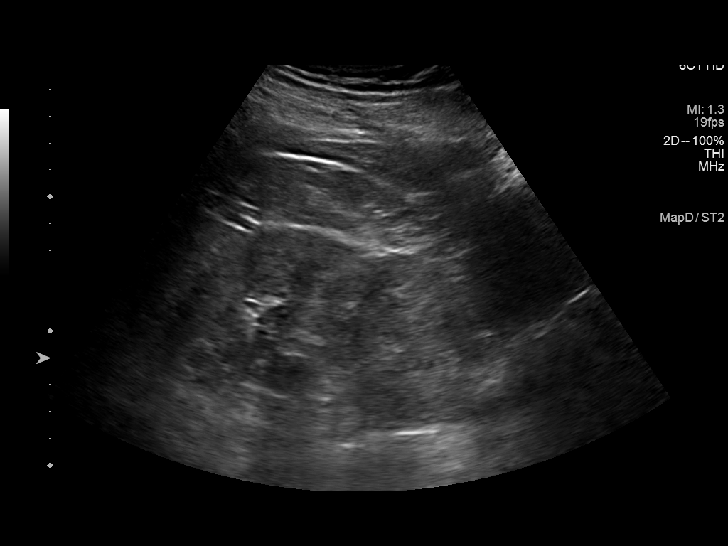
[im 32/43]
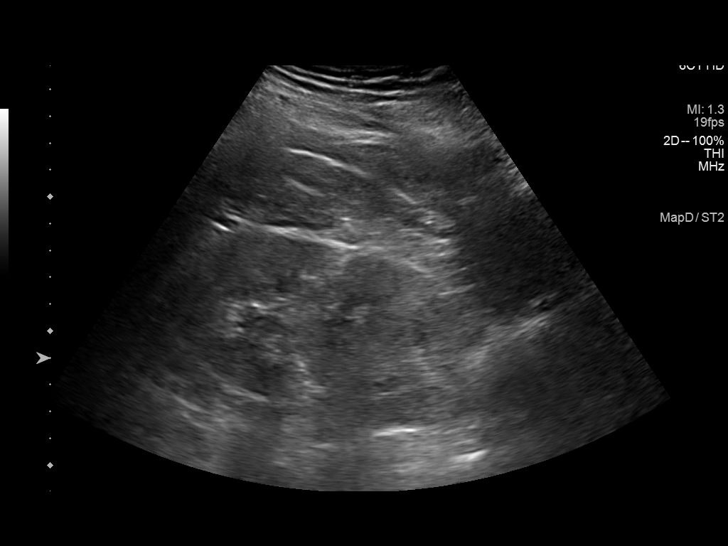
[im 36/43]
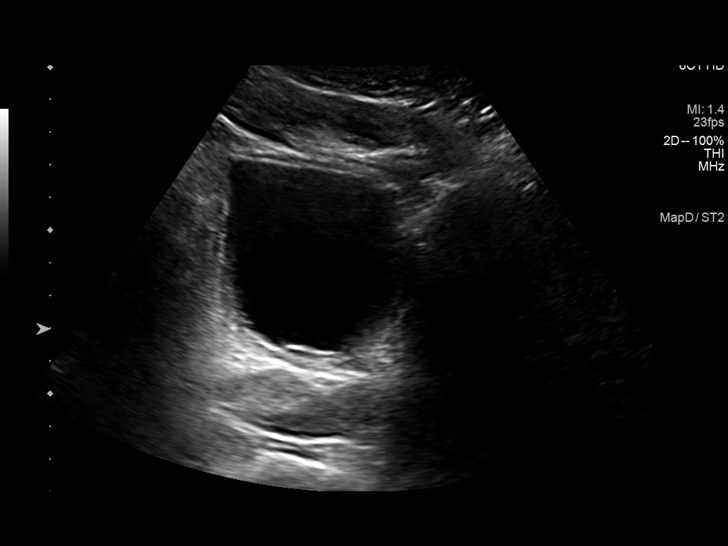
[im 39/43]
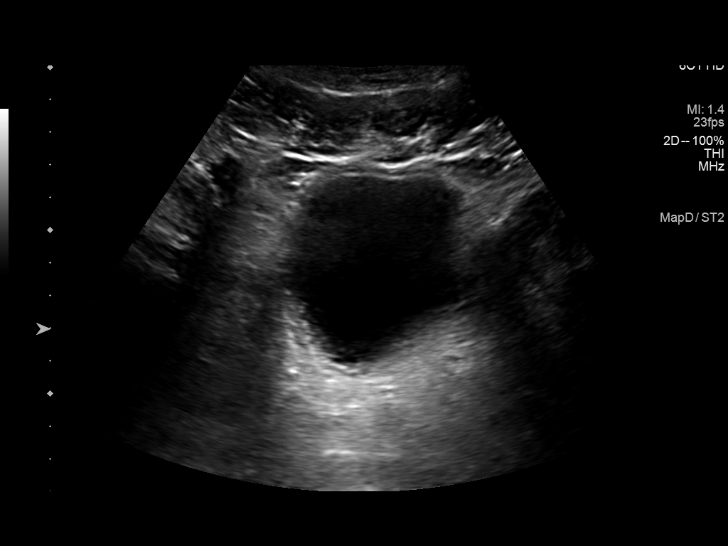
[im 43/43]
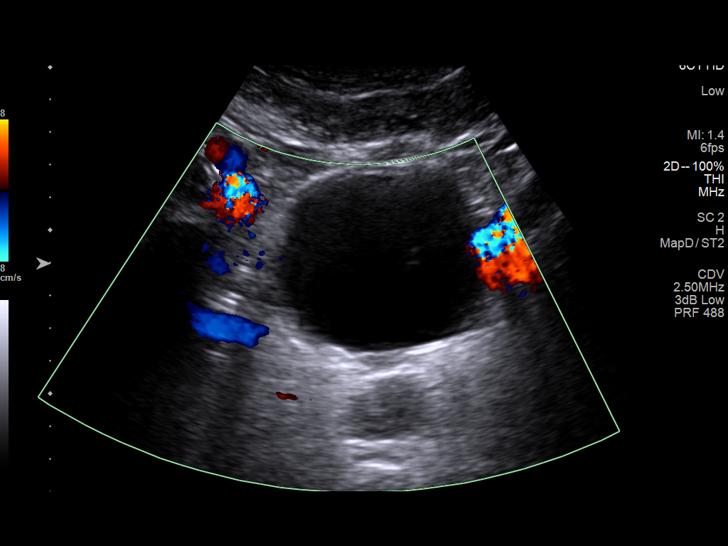

[14 of 25 positions shown; findings below may reference images not displayed]

FINDINGS: Right Kidney:

Renal measurements: 12.4 x 4.8 x 5.0 cm = volume: 158 mL. Diffuse
increased cortical echogenicity.

Left Kidney:

Renal measurements: 11.5 x 5.8 x 5.9 cm = volume: 207 mL. Diffuse
increased echogenicity.

Bladder:

Appears normal for degree of bladder distention.

Other:

None.
IMPRESSION: 1. Diffuse increased echogenicity in both kidneys is consistent with
medical renal disease. No acute abnormalities identified.

## 2023-06-25 DIAGNOSIS — G4733 Obstructive sleep apnea (adult) (pediatric): Secondary | ICD-10-CM | POA: Diagnosis not present

## 2023-07-06 ENCOUNTER — Ambulatory Visit
Admission: RE | Admit: 2023-07-06 | Discharge: 2023-07-06 | Disposition: A | Discharge status: Home / Self Care | Payer: BC Managed Care – PPO | Source: Ambulatory Visit | Attending: Internal Medicine | Admitting: Internal Medicine

## 2023-07-06 ENCOUNTER — Encounter (INDEPENDENT_AMBULATORY_CARE_PROVIDER_SITE_OTHER): Payer: Self-pay

## 2023-07-06 VITALS — BP 157/110 | HR 83 | Temp 98.0°F | Resp 18

## 2023-07-06 DIAGNOSIS — M109 Gout, unspecified: Secondary | ICD-10-CM

## 2023-07-06 MED ORDER — PREDNISONE 20 MG PO TABS
40.0000 mg | ORAL_TABLET | Freq: Every day | ORAL | 0 refills | Status: AC
Start: 2023-07-06 — End: 2023-07-13

## 2023-07-06 NOTE — Discharge Instructions (Signed)
Start prednisone daily for the next 7 days.  Please follow-up with your PCP if your symptoms are not improving.  Please go to the emergency room for any worsening symptoms.  I hope you feel better soon!

## 2023-07-06 NOTE — ED Triage Notes (Signed)
Pt presents with gout on the lt foot. Pt states he usually is given prednisone. Reports he ran out. States it is swollen, tender and warm to the touch.

## 2023-07-06 NOTE — ED Provider Notes (Signed)
[facility]    CSN: [id number] Arrival date & time: 07/06/23  0940      History   Chief Complaint Chief Complaint  Patient presents with   Foot Pain    Gout like symptoms. Pain, swelling, and warmth in left foot. - Entered by patient    HPI Taylor Herrera is a 47 y.o. male presents for evaluation of gout.  Patient has a past medical history of hypertension and chronic kidney disease.  Reports 3 days of right midfoot pain, swelling and warmth.  Has a history of gout and states these are the same symptoms.  Denies any injury.  No fevers or chills.  Does endorse pain with weightbearing.  States has been on prednisone in the past for gout with relief of symptoms.  He denies any change in diet or alcohol intake.  He is on a half a dose of allopurinol daily.  Denies any other concerns at this time.   Foot Pain    Past Medical History:  Diagnosis Date   Essential hypertension 09/16/2014   History of alcohol abuse 08/01/2018   History of kidney stones    HTN (hypertension) 04/23/2015   Hypertension    Migraine    Preventative health care 04/23/2015   Renal disorder     Patient Active Problem List   Diagnosis Date Noted   Gout 08/09/2022   Hyperglycemia 08/09/2022   Nocturnal hypoxemia 05/03/2022   Vitamin D deficiency 04/25/2022   Depression 02/04/2022   OSA (obstructive sleep apnea) 08/14/2020   History of alcohol abuse 08/01/2018   Preventative health care 04/23/2015   Migraine 04/23/2015   Essential hypertension 09/16/2014    History reviewed. No pertinent surgical history.     Home Medications    Prior to Admission medications   Medication Sig Start Date End Date Taking? Authorizing Provider  predniSONE (DELTASONE) 20 MG tablet Take 2 tablets (40 mg total) by mouth daily with breakfast for 7 days. 07/06/23 07/13/23 Yes Radford Pax, NP  allopurinol (ZYLOPRIM) 100 MG tablet Take 0.5 tablets (50 mg total) by mouth every other day. 02/14/23   Sandford Craze, NP  ALPRAZolam Prudy Feeler) 1 MG tablet Take 1 tablet by mouth 1 hour prior to procedure 03/01/23   Stoneking, Danford Bad., MD  carvedilol (COREG) 25 MG tablet TAKE 1 TABLET (25 MG TOTAL) BY MOUTH TWICE A DAY WITH MEALS 02/14/23   Sandford Craze, NP  Cholecalciferol (HM VITAMIN D3) 100 MCG (4000 UT) CAPS Take 1 tablet by mouth once daily. 04/25/22   Sandford Craze, NP  cloNIDine (CATAPRES) 0.1 MG tablet Take 1 tablet (0.1 mg total) by mouth 2 (two) times daily. 02/14/23   Sandford Craze, NP  escitalopram (LEXAPRO) 10 MG tablet Take 1 tablet (10 mg total) by mouth daily. 02/14/23   Sandford Craze, NP  HYDROcodone-acetaminophen (NORCO) 5-325 MG tablet Take 1-2 tablets by mouth every 6 (six) hours as needed. 02/18/23   Geoffery Lyons, MD  HYDROcodone-acetaminophen (NORCO/VICODIN) 5-325 MG tablet Take 1 tablet by mouth every 6 (six) hours as needed for moderate pain. 04/27/23   Stoneking, Danford Bad., MD  Olmesartan-amLODIPine-HCTZ 40-10-25 MG TABS Take 1 tablet by mouth daily. 02/14/23   Sandford Craze, NP    Family History Family History  Problem Relation Age of Onset   Cancer Mother        multiple myeloma, ovarian   Hypertension Mother    Hypertension Father    Arrhythmia Father    Kidney disease Father  ESRD on HD   Hypertension Sister     Social History Social History   Tobacco Use   Smoking status: Never   Smokeless tobacco: Never  Substance Use Topics   Alcohol use: Not Currently    Comment: none currently   Drug use: No     Allergies   Patient has no known allergies.   Review of Systems Review of Systems  Musculoskeletal:        Left foot pain     Physical Exam Triage Vital Signs ED Triage Vitals [07/06/23 0945]  Encounter Vitals Group     BP (!) 157/110     Systolic BP Percentile      Diastolic BP Percentile      Pulse Rate 83     Resp 18     Temp 98 F (36.7 C)     Temp Source Oral     SpO2 95 %     Weight      Height       Head Circumference      Peak Flow      Pain Score 5     Pain Loc      Pain Education      Exclude from Growth Chart    No data found.  Updated Vital Signs BP (!) 157/110 (BP Location: Right Arm)   Pulse 83   Temp 98 F (36.7 C) (Oral)   Resp 18   SpO2 95%   Visual Acuity Right Eye Distance:   Left Eye Distance:   Bilateral Distance:    Right Eye Near:   Left Eye Near:    Bilateral Near:     Physical Exam Vitals and nursing note reviewed.  Constitutional:      General: He is not in acute distress.    Appearance: Normal appearance. He is not ill-appearing.  HENT:     Head: Normocephalic and atraumatic.  Eyes:     Pupils: Pupils are equal, round, and reactive to light.  Cardiovascular:     Rate and Rhythm: Normal rate.  Pulmonary:     Effort: Pulmonary effort is normal.  Musculoskeletal:       Feet:  Feet:     Comments: Mild swelling erythema and warmth of the left first MTP joint that extends to the mid dorsum of foot.  Skin is intact.  DP +2. Skin:    General: Skin is warm and dry.  Neurological:     General: No focal deficit present.     Mental Status: He is alert and oriented to person, place, and time.  Psychiatric:        Mood and Affect: Mood normal.        Behavior: Behavior normal.      UC Treatments / Results  Labs (all labs ordered are listed, but only abnormal results are displayed) Labs Reviewed - No data to display  EKG   Radiology No results found.  Procedures Procedures (including critical care time)  Medications Ordered in UC Medications - No data to display  Initial Impression / Assessment and Plan / UC Course  I have reviewed the triage vital signs and the nursing notes.  Pertinent labs & imaging results that were available during my care of the patient were reviewed by me and considered in my medical decision making (see chart for details).     Reviewed exam and symptoms with patient.  No red flags.  Will start  prednisone for gout flare.  Advise  he follow-up with his PCP to possibly increase his allopurinol dose as he is only on half a dose and continues to have gout flares.  Discussed low purine diet/avoidance of alcohol.  Patient is following with his PCP for blood pressure management as well.  ER precautions reviewed and patient verbalized understanding. Final Clinical Impressions(s) / UC Diagnoses   Final diagnoses:  Acute gout of left foot, unspecified cause     Discharge Instructions      Start prednisone daily for the next 7 days.  Please follow-up with your PCP if your symptoms are not improving.  Please go to the emergency room for any worsening symptoms.  I hope you feel better soon!    ED Prescriptions     Medication Sig Dispense Auth. Provider   predniSONE (DELTASONE) 20 MG tablet Take 2 tablets (40 mg total) by mouth daily with breakfast for 7 days. 14 tablet Radford Pax, NP      PDMP not reviewed this encounter.   Radford Pax, NP 07/06/23 1002

## 2023-07-10 ENCOUNTER — Other Ambulatory Visit: Payer: Self-pay | Admitting: Oncology

## 2023-07-10 DIAGNOSIS — Z006 Encounter for examination for normal comparison and control in clinical research program: Secondary | ICD-10-CM

## 2023-07-26 DIAGNOSIS — G4733 Obstructive sleep apnea (adult) (pediatric): Secondary | ICD-10-CM | POA: Diagnosis not present

## 2023-08-02 ENCOUNTER — Other Ambulatory Visit: Payer: Self-pay | Admitting: Family

## 2023-08-02 DIAGNOSIS — I10 Essential (primary) hypertension: Secondary | ICD-10-CM

## 2023-08-04 ENCOUNTER — Other Ambulatory Visit: Payer: BC Managed Care – PPO

## 2023-08-04 DIAGNOSIS — Z302 Encounter for sterilization: Secondary | ICD-10-CM

## 2023-08-05 LAB — POST-VAS SPERM EVALUATION,QUAL: Volume: 1.1 mL

## 2023-08-09 ENCOUNTER — Encounter: Payer: Self-pay | Admitting: Pharmacist

## 2023-08-16 ENCOUNTER — Ambulatory Visit: Payer: Managed Care, Other (non HMO) | Admitting: Family

## 2023-08-25 DIAGNOSIS — G4733 Obstructive sleep apnea (adult) (pediatric): Secondary | ICD-10-CM | POA: Diagnosis not present

## 2023-09-09 DIAGNOSIS — F1020 Alcohol dependence, uncomplicated: Secondary | ICD-10-CM | POA: Diagnosis not present

## 2023-09-11 ENCOUNTER — Other Ambulatory Visit (HOSPITAL_COMMUNITY): Payer: BC Managed Care – PPO

## 2023-09-16 DIAGNOSIS — F1020 Alcohol dependence, uncomplicated: Secondary | ICD-10-CM | POA: Diagnosis not present

## 2023-09-23 DIAGNOSIS — F1020 Alcohol dependence, uncomplicated: Secondary | ICD-10-CM | POA: Diagnosis not present

## 2023-09-25 DIAGNOSIS — G4733 Obstructive sleep apnea (adult) (pediatric): Secondary | ICD-10-CM | POA: Diagnosis not present

## 2023-09-26 ENCOUNTER — Other Ambulatory Visit: Payer: Self-pay | Admitting: Family

## 2023-09-26 DIAGNOSIS — I10 Essential (primary) hypertension: Secondary | ICD-10-CM

## 2023-09-30 DIAGNOSIS — F1020 Alcohol dependence, uncomplicated: Secondary | ICD-10-CM | POA: Diagnosis not present

## 2023-10-10 ENCOUNTER — Other Ambulatory Visit (HOSPITAL_COMMUNITY): Payer: BC Managed Care – PPO

## 2023-10-16 ENCOUNTER — Ambulatory Visit: Payer: Managed Care, Other (non HMO) | Admitting: Family

## 2023-10-21 ENCOUNTER — Telehealth: Payer: BC Managed Care – PPO | Admitting: Family Medicine

## 2023-10-21 DIAGNOSIS — M109 Gout, unspecified: Secondary | ICD-10-CM

## 2023-10-21 MED ORDER — PREDNISONE 10 MG (21) PO TBPK: ORAL_TABLET | ORAL | 0 refills | Status: AC

## 2023-10-21 NOTE — Progress Notes (Signed)
Virtual Visit Consent   Taylor Herrera, you are scheduled for a virtual visit with a [facility] provider today. Just as with appointments in the office, your consent must be obtained to participate. Your consent will be active for this visit and any virtual visit you may have with one of our providers in the next 365 days. If you have a MyChart account, a copy of this consent can be sent to you electronically.  As this is a virtual visit, video technology does not allow for your provider to perform a traditional examination. This may limit your provider's ability to fully assess your condition. If your provider identifies any concerns that need to be evaluated in person or the need to arrange testing (such as labs, EKG, etc.), we will make arrangements to do so. Although advances in technology are sophisticated, we cannot ensure that it will always work on either your end or our end. If the connection with a video visit is poor, the visit may have to be switched to a telephone visit. With either a video or telephone visit, we are not always able to ensure that we have a secure connection.  By engaging in this virtual visit, you consent to the provision of healthcare and authorize for your insurance to be billed (if applicable) for the services provided during this visit. Depending on your insurance coverage, you may receive a charge related to this service.  I need to obtain your verbal consent now. Are you willing to proceed with your visit today? Taylor Herrera has provided verbal consent on 10/21/2023 for a virtual visit (video or telephone). Georgana Curio, FNP  Date: 10/21/2023 12:17 PM  Virtual Visit via Video Note   I, Georgana Curio, connected with  Taylor Herrera  ([id number], [birth year]) on 10/21/23 at 12:15 PM EST by a video-enabled telemedicine application and verified that I am speaking with the correct person using two identifiers.  Location: Patient: Virtual Visit Location Patient:  Home Provider: Virtual Visit Location Provider: Home Office   I discussed the limitations of evaluation and management by telemedicine and the availability of in person appointments. The patient expressed understanding and agreed to proceed.    History of Present Illness: Taylor Herrera is a 47 y.o. who identifies as a male who was assigned male at birth, and is being seen today for a gout flare. Reports he ate more through the holidays. He is not able to take nsaids due to renal problems. This is thrid attack which he will discuss with pcp. Marland Kitchen  HPI: HPI  Problems:  Patient Active Problem List   Diagnosis Date Noted   Gout 08/09/2022   Hyperglycemia 08/09/2022   Nocturnal hypoxemia 05/03/2022   Vitamin D deficiency 04/25/2022   Depression 02/04/2022   OSA (obstructive sleep apnea) 08/14/2020   History of alcohol abuse 08/01/2018   Preventative health care 04/23/2015   Migraine 04/23/2015   Essential hypertension 09/16/2014    Allergies: No Known Allergies Medications:  Current Outpatient Medications:    allopurinol (ZYLOPRIM) 100 MG tablet, Take 0.5 tablets (50 mg total) by mouth every other day., Disp: 21 tablet, Rfl: 4   ALPRAZolam (XANAX) 1 MG tablet, Take 1 tablet by mouth 1 hour prior to procedure, Disp: 1 tablet, Rfl: 0   carvedilol (COREG) 25 MG tablet, TAKE 1 TABLET (25 MG TOTAL) BY MOUTH TWICE A DAY WITH MEALS, Disp: 180 tablet, Rfl: 0   Cholecalciferol (HM VITAMIN D3) 100 MCG (4000 UT) CAPS, Take 1 tablet by mouth once  daily., Disp: 30 capsule, Rfl:    cloNIDine (CATAPRES) 0.1 MG tablet, Take 1 tablet (0.1 mg total) by mouth 2 (two) times daily., Disp: 180 tablet, Rfl: 1   escitalopram (LEXAPRO) 10 MG tablet, Take 1 tablet (10 mg total) by mouth daily., Disp: 90 tablet, Rfl: 1   HYDROcodone-acetaminophen (NORCO) 5-325 MG tablet, Take 1-2 tablets by mouth every 6 (six) hours as needed., Disp: 15 tablet, Rfl: 0   HYDROcodone-acetaminophen (NORCO/VICODIN) 5-325 MG tablet, Take  1 tablet by mouth every 6 (six) hours as needed for moderate pain., Disp: 10 tablet, Rfl: 0   Olmesartan-amLODIPine-HCTZ 40-10-25 MG TABS, TAKE 1 TABLET BY MOUTH EVERY DAY, Disp: 90 tablet, Rfl: 1  Observations/Objective: Patient is well-developed, well-nourished in no acute distress.  Resting comfortably  at home.  Head is normocephalic, atraumatic.  No labored breathing.  Speech is clear and coherent with logical content.  Patient is alert and oriented at baseline.    Assessment and Plan: 1. Gout, unspecified cause, unspecified chronicity, unspecified site  Follow up with PCP in December to discuss allopurinol.   Follow Up Instructions: I discussed the assessment and treatment plan with the patient. The patient was provided an opportunity to ask questions and all were answered. The patient agreed with the plan and demonstrated an understanding of the instructions.  A copy of instructions were sent to the patient via MyChart unless otherwise noted below.     The patient was advised to call back or seek an in-person evaluation if the symptoms worsen or if the condition fails to improve as anticipated.    Georgana Curio, FNP

## 2023-10-21 NOTE — Patient Instructions (Signed)
Gout  Gout is a condition that causes painful swelling of the joints. Gout is a type of inflammation of the joints (arthritis). This condition is caused by having too much uric acid in the body. Uric acid is a chemical that forms when the body breaks down substances called purines. Purines are important for building body proteins. When the body has too much uric acid, sharp crystals can form and build up inside the joints. This causes pain and swelling. Gout attacks can happen quickly and may be very painful (acute gout). Over time, the attacks can affect more joints and become more frequent (chronic gout). Gout can also cause uric acid to build up under the skin and inside the kidneys. What are the causes? This condition is caused by too much uric acid in your blood. This can happen because: Your kidneys do not remove enough uric acid from your blood. This is the most common cause. Your body makes too much uric acid. This can happen with some cancers and cancer treatments. It can also occur if your body is breaking down too many red blood cells (hemolytic anemia). You eat too many foods that are high in purines. These foods include organ meats and some seafood. Alcohol, especially beer, is also high in purines. A gout attack may be triggered by trauma or stress. What increases the risk? The following factors may make you more likely to develop this condition: Having a family history of gout. Being male and middle-aged. Being male and having gone through menopause. Taking certain medicines, including aspirin, cyclosporine, diuretics, levodopa, and niacin. Having an organ transplant. Having certain conditions, such as: Being obese. Lead poisoning. Kidney disease. A skin condition called psoriasis. Other factors include: Losing weight too quickly. Being dehydrated. Frequently drinking alcohol, especially beer. Frequently drinking beverages that are sweetened with a type of sugar called  fructose. What are the signs or symptoms? An attack of acute gout happens quickly. It usually occurs in just one joint. The most common place is the big toe. Attacks often start at night. Other joints that may be affected include joints of the feet, ankle, knee, fingers, wrist, or elbow. Symptoms of this condition may include: Severe pain. Warmth. Swelling. Stiffness. Tenderness. The affected joint may be very painful to touch. Shiny, red, or purple skin. Chills and fever. Chronic gout may cause symptoms more frequently. More joints may be involved. You may also have white or yellow lumps (tophi) on your hands or feet or in other areas near your joints. How is this diagnosed? This condition is diagnosed based on your symptoms, your medical history, and a physical exam. You may have tests, such as: Blood tests to measure uric acid levels. Removal of joint fluid with a thin needle (aspiration) to look for uric acid crystals. X-rays to look for joint damage. How is this treated? Treatment for this condition has two phases: treating an acute attack and preventing future attacks. Acute gout treatment may include medicines to reduce pain and swelling, including: NSAIDs, such as ibuprofen. Steroids. These are strong anti-inflammatory medicines that can be taken by mouth (orally) or injected into a joint. Colchicine. This medicine relieves pain and swelling when it is taken soon after an attack. It can be given by mouth or through an IV. Preventive treatment may include: Daily use of smaller doses of NSAIDs or colchicine. Use of a medicine that reduces uric acid levels in your blood, such as allopurinol. Changes to your diet. You may need to see   a dietitian about what to eat and drink to prevent gout. Follow these instructions at home: During a gout attack  If directed, put ice on the affected area. To do this: Put ice in a plastic bag. Place a towel between your skin and the bag. Leave the  ice on for 20 minutes, 2-3 times a day. Remove the ice if your skin turns bright red. This is very important. If you cannot feel pain, heat, or cold, you have a greater risk of damage to the area. Raise (elevate) the affected joint above the level of your heart as often as possible. Rest the joint as much as possible. If the affected joint is in your leg, you may be given crutches to use. Follow instructions from your health care provider about eating or drinking restrictions. Avoiding future gout attacks Follow a low-purine diet as told by your dietitian or health care provider. Avoid foods and drinks that are high in purines, including liver, kidney, anchovies, asparagus, herring, mushrooms, mussels, and beer. Maintain a healthy weight or lose weight if you are overweight. If you want to lose weight, talk with your health care provider. Do not lose weight too quickly. Start or maintain an exercise program as told by your health care provider. Eating and drinking Avoid drinking beverages that contain fructose. Drink enough fluids to keep your urine pale yellow. If you drink alcohol: Limit how much you have to: 0-1 drink a day for women who are not pregnant. 0-2 drinks a day for men. Know how much alcohol is in a drink. In the [place] one drink equals one 12 oz bottle of beer (355 mL), one 5 oz glass of wine (148 mL), or one 1 oz glass of hard liquor (44 mL). General instructions Take over-the-counter and prescription medicines only as told by your health care provider. Ask your health care provider if the medicine prescribed to you requires you to avoid driving or using machinery. Return to your normal activities as told by your health care provider. Ask your health care provider what activities are safe for you. Keep all follow-up visits. This is important. Where to find more information National Institutes of Health: www.niams.nih.gov Contact a health care provider if you have: Another  gout attack. Continuing symptoms of a gout attack after 10 days of treatment. Side effects from your medicines. Chills or a fever. Burning pain when you urinate. Pain in your lower back or abdomen. Get help right away if you: Have severe or uncontrolled pain. Cannot urinate. Summary Gout is painful swelling of the joints caused by having too much uric acid in the body. The most common site for gout to occur is in the big toe, but it can affect other joints in the body. Medicines and dietary changes can help to prevent and treat gout attacks. This information is not intended to replace advice given to you by your health care provider. Make sure you discuss any questions you have with your health care provider. Document Revised: 08/11/2021 Document Reviewed: 08/11/2021 Elsevier Patient Education  2024 [employer]  

## 2023-10-23 ENCOUNTER — Ambulatory Visit: Payer: Managed Care, Other (non HMO) | Admitting: Family

## 2023-10-24 ENCOUNTER — Other Ambulatory Visit: Payer: Self-pay | Admitting: Family

## 2023-10-24 ENCOUNTER — Ambulatory Visit: Payer: Managed Care, Other (non HMO) | Admitting: Family

## 2023-10-24 DIAGNOSIS — Z1211 Encounter for screening for malignant neoplasm of colon: Secondary | ICD-10-CM

## 2023-10-25 DIAGNOSIS — G4733 Obstructive sleep apnea (adult) (pediatric): Secondary | ICD-10-CM | POA: Diagnosis not present

## 2023-10-26 ENCOUNTER — Other Ambulatory Visit: Payer: Self-pay | Admitting: Family

## 2023-10-26 DIAGNOSIS — I10 Essential (primary) hypertension: Secondary | ICD-10-CM

## 2023-11-07 ENCOUNTER — Ambulatory Visit: Payer: Managed Care, Other (non HMO) | Admitting: Family

## 2023-11-07 VITALS — BP 98/61 | HR 63 | Temp 97.6°F | Resp 16 | Ht 73.0 in | Wt 245.0 lb

## 2023-11-07 DIAGNOSIS — N2581 Secondary hyperparathyroidism of renal origin: Secondary | ICD-10-CM | POA: Diagnosis not present

## 2023-11-07 DIAGNOSIS — G4733 Obstructive sleep apnea (adult) (pediatric): Secondary | ICD-10-CM | POA: Diagnosis not present

## 2023-11-07 DIAGNOSIS — E559 Vitamin D deficiency, unspecified: Secondary | ICD-10-CM | POA: Diagnosis not present

## 2023-11-07 DIAGNOSIS — G43809 Other migraine, not intractable, without status migrainosus: Secondary | ICD-10-CM | POA: Diagnosis not present

## 2023-11-07 DIAGNOSIS — N1832 Chronic kidney disease, stage 3b: Secondary | ICD-10-CM | POA: Diagnosis not present

## 2023-11-07 DIAGNOSIS — R739 Hyperglycemia, unspecified: Secondary | ICD-10-CM

## 2023-11-07 DIAGNOSIS — D631 Anemia in chronic kidney disease: Secondary | ICD-10-CM | POA: Diagnosis not present

## 2023-11-07 DIAGNOSIS — M109 Gout, unspecified: Secondary | ICD-10-CM

## 2023-11-07 DIAGNOSIS — R799 Abnormal finding of blood chemistry, unspecified: Secondary | ICD-10-CM | POA: Diagnosis not present

## 2023-11-07 DIAGNOSIS — F1011 Alcohol abuse, in remission: Secondary | ICD-10-CM | POA: Diagnosis not present

## 2023-11-07 DIAGNOSIS — I10 Essential (primary) hypertension: Secondary | ICD-10-CM

## 2023-11-07 DIAGNOSIS — F32A Depression, unspecified: Secondary | ICD-10-CM

## 2023-11-07 DIAGNOSIS — I129 Hypertensive chronic kidney disease with stage 1 through stage 4 chronic kidney disease, or unspecified chronic kidney disease: Secondary | ICD-10-CM | POA: Diagnosis not present

## 2023-11-07 MED ORDER — ESCITALOPRAM OXALATE 10 MG PO TABS: 10.0000 mg | ORAL_TABLET | Freq: Every day | ORAL | 1 refills | Status: DC

## 2023-11-07 MED ORDER — ALLOPURINOL 100 MG PO TABS: 50.0000 mg | ORAL_TABLET | ORAL | 4 refills | Status: DC

## 2023-11-07 NOTE — Assessment & Plan Note (Signed)
No recent headaches since he started cpap.

## 2023-11-07 NOTE — Assessment & Plan Note (Signed)
>>  ASSESSMENT AND PLAN FOR HISTORY OF ALCOHOL ABUSE WRITTEN ON 11/07/2023  1:15 PM BY O'SULLIVAN, Miarose Lippert, NP  No alcohol currently.

## 2023-11-07 NOTE — Assessment & Plan Note (Signed)
BP a little soft, but he is asymtomatic. He will check his BP daily for the next 2 weeks and send me his updated readings via mychart. Continue Tribenzor, catapres and carvedilol.

## 2023-11-07 NOTE — Progress Notes (Signed)
Subjective:     Patient ID: Taylor Herrera, male    DOB: [birth year], 47 y.o.   MRN: [id number]  No chief complaint on file.   HPI  Discussed the use of AI scribe software for clinical note transcription with the patient, who gave verbal consent to proceed.  History of Present Illness   The patient, with a history of chronic kidney disease, diabetes, sleep apnea, and gout, presents for a routine follow-up. He recently saw a nephrologist and had labs drawn. He reports a recent gout flare after Thanksgiving, which he attributes to dietary indiscretions. He is currently on a low dose of allopurinol due to his kidney function, but there has been discussion about increasing the dose.  The patient's sleep apnea is well-managed with a CPAP machine, which he reports using regularly. He has noticed an improvement in his symptoms and overall well-being since starting CPAP therapy.  He is also on a vitamin D supplement and Lexapro for mood management, both of which he reports taking regularly. He has not had any recent headaches. He denies any recent alcohol use.  The patient is due for colon cancer screening and has been approved for a colonoscopy. He has not yet received a call to schedule the procedure.     BP Readings from Last 3 Encounters:  11/07/23 98/61  07/06/23 (!) 157/110  04/27/23 (!) 158/104       Health Maintenance Due  Topic Date Due   Colonoscopy  Never done    Past Medical History:  Diagnosis Date   Essential hypertension 09/16/2014   History of alcohol abuse 08/01/2018   History of kidney stones    HTN (hypertension) 04/23/2015   Hypertension    Migraine    Preventative health care 04/23/2015   Renal disorder     No past surgical history on file.  Family History  Problem Relation Age of Onset   Cancer Mother        multiple myeloma, ovarian   Hypertension Mother    Hypertension Father    Arrhythmia Father    Kidney disease Father        ESRD on HD    Hypertension Sister     Social History   Socioeconomic History   Marital status: Married    Spouse name: Not on file   Number of children: Not on file   Years of education: Not on file   Highest education level: Bachelor's degree (e.g., BA, AB, BS)  Occupational History   Not on file  Tobacco Use   Smoking status: Never   Smokeless tobacco: Never  Substance and Sexual Activity   Alcohol use: Not Currently    Comment: none currently   Drug use: No   Sexual activity: Yes    Partners: Female  Other Topics Concern   Not on file  Social History Narrative   Driver for [employer]   Married- two kids (one son and one daughter) both healthy   Originally from [place]   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   Completed associates degree   Social Drivers of Health   Financial Resource Strain: Low Risk  (11/06/2023)   Overall Financial Resource Strain (CARDIA)    Difficulty of Paying Living Expenses: Not very hard  Food Insecurity: No Food Insecurity (11/06/2023)   Hunger Vital Sign    Worried About Running Out of Food in the Last Year: Never true    Ran Out of Food in the Last Year: Never true  Transportation Needs: No Transportation Needs (11/06/2023)   PRAPARE - Administrator, Civil Service (Medical): No    Lack of Transportation (Non-Medical): No  Physical Activity: Sufficiently Active (11/06/2023)   Exercise Vital Sign    Days of Exercise per Week: 6 days    Minutes of Exercise per Session: 60 min  Stress: No Stress Concern Present (11/06/2023)   [employer] of Occupational Health - Occupational Stress Questionnaire    Feeling of Stress : Not at all  Social Connections: Socially Integrated (11/06/2023)   Social Connection and Isolation Panel [NHANES]    Frequency of Communication with Friends and Family: More than three times a week    Frequency of Social Gatherings with Friends and Family: Once a week    Attends Religious Services: More than 4 times per year     Active Member of [employer] or Organizations: Yes    Attends Engineer, structural: More than 4 times per year    Marital Status: Married  Catering manager Violence: Unknown (02/23/2022)   Received from [employer] [facility], [facility]   HITS    Physically Hurt: Not on file    Insult or Talk Down To: Not on file    Threaten Physical Harm: Not on file    Scream or Curse: Not on file    Outpatient Medications Prior to Visit  Medication Sig Dispense Refill   carvedilol (COREG) 25 MG tablet TAKE 1 TABLET (25 MG TOTAL) BY MOUTH TWICE A DAY WITH MEALS 180 tablet 0   Cholecalciferol (HM VITAMIN D3) 100 MCG (4000 UT) CAPS Take 1 tablet by mouth once daily. 30 capsule    cloNIDine (CATAPRES) 0.1 MG tablet Take 1 tablet (0.1 mg total) by mouth 2 (two) times daily. 180 tablet 1   HYDROcodone-acetaminophen (NORCO) 5-325 MG tablet Take 1-2 tablets by mouth every 6 (six) hours as needed. 15 tablet 0   allopurinol (ZYLOPRIM) 100 MG tablet Take 0.5 tablets (50 mg total) by mouth every other day. 21 tablet 4   escitalopram (LEXAPRO) 10 MG tablet Take 1 tablet (10 mg total) by mouth daily. 90 tablet 1   ALPRAZolam (XANAX) 1 MG tablet Take 1 tablet by mouth 1 hour prior to procedure 1 tablet 0   HYDROcodone-acetaminophen (NORCO/VICODIN) 5-325 MG tablet Take 1 tablet by mouth every 6 (six) hours as needed for moderate pain. 10 tablet 0   Olmesartan-amLODIPine-HCTZ 40-10-25 MG TABS TAKE 1 TABLET BY MOUTH EVERY DAY 90 tablet 1   No facility-administered medications prior to visit.    No Known Allergies  ROS    See HPI Objective:    Physical Exam Constitutional:      General: He is not in acute distress.    Appearance: He is well-developed.  HENT:     Head: Normocephalic and atraumatic.  Cardiovascular:     Rate and Rhythm: Normal rate and regular rhythm.     Heart sounds: No murmur heard. Pulmonary:     Effort: Pulmonary effort is normal. No respiratory distress.     Breath sounds: Normal  breath sounds. No wheezing or rales.  Skin:    General: Skin is warm and dry.  Neurological:     Mental Status: He is alert and oriented to person, place, and time.  Psychiatric:        Behavior: Behavior normal.        Thought Content: Thought content normal.      BP 98/61 (BP Location: Right Arm, Patient Position:  Sitting, Cuff Size: Large)   Pulse 63   Temp 97.6 F (36.4 C) (Oral)   Resp 16   Ht 6\' 1"  (1.854 m)   Wt 245 lb (111.1 kg)   SpO2 99%   BMI 32.32 kg/m  Wt Readings from Last 3 Encounters:  11/07/23 245 lb (111.1 kg)  03/01/23 245 lb (111.1 kg)  02/18/23 253 lb 8.5 oz (115 kg)       Assessment & Plan:   Problem List Items Addressed This Visit       Unprioritized   Vitamin D deficiency   On otc supplement 4000 international units once daily. Update vit D level.       Relevant Orders   Vitamin D (25 hydroxy)   OSA (obstructive sleep apnea) - Primary   Reports good compliance with cpap.      Migraine   No recent headaches since he started cpap.       Relevant Medications   escitalopram (LEXAPRO) 10 MG tablet   allopurinol (ZYLOPRIM) 100 MG tablet   Hyperglycemia   Lab Results  Component Value Date   HGBA1C 6.0 02/14/2023         History of alcohol abuse   No alcohol currently.       Gout   Recent flare.  Due to renal function, cannot increase allopurinol dose.  Discussed dietary modification and pt was given handout on a low purine diet.       Relevant Medications   allopurinol (ZYLOPRIM) 100 MG tablet   Essential hypertension   BP a little soft, but he is asymtomatic. He will check his BP daily for the next 2 weeks and send me his updated readings via mychart. Continue Tribenzor, catapres and carvedilol.       Depression   Stable mood on lexapro.       Relevant Medications   escitalopram (LEXAPRO) 10 MG tablet    I have discontinued Marcell Valentino's ALPRAZolam and Olmesartan-amLODIPine-HCTZ. I am also having him maintain his  HM Vitamin D3, cloNIDine, HYDROcodone-acetaminophen, carvedilol, escitalopram, and allopurinol.  Meds ordered this encounter  Medications   escitalopram (LEXAPRO) 10 MG tablet    Sig: Take 1 tablet (10 mg total) by mouth daily.    Dispense:  90 tablet    Refill:  1    Supervising Provider:   Danise Edge A [[id number]]   allopurinol (ZYLOPRIM) 100 MG tablet    Sig: Take 0.5 tablets (50 mg total) by mouth every other day.    Dispense:  21 tablet    Refill:  4    Supervising Provider:   Danise Edge A [[id number]]

## 2023-11-07 NOTE — Patient Instructions (Signed)
VISIT SUMMARY:  You came in today for a routine follow-up. We discussed your chronic kidney disease, diabetes, sleep apnea, gout, and other health concerns. You recently had a gout flare, and we talked about managing it better. Your sleep apnea is well-managed with your CPAP machine, and you are feeling better. We also discussed your upcoming colonoscopy and other routine health checks.  YOUR PLAN:  -CHRONIC KIDNEY DISEASE: Chronic kidney disease means your kidneys are not working as well as they should. Continue following the management plan set by your nephrologist.  -DIABETES MELLITUS: Diabetes is a condition where your blood sugar levels are too high. We will draw your A1c level today to check your blood sugar control.  -HYPERLIPIDEMIA: Hyperlipidemia means you have high levels of fats in your blood. Continue with your current management plan.  -VITAMIN D DEFICIENCY: Vitamin D deficiency means you do not have enough vitamin D in your body. We will check your vitamin D level to ensure your supplement is working.  -OBSTRUCTIVE SLEEP APNEA: Obstructive sleep apnea is a condition where your breathing stops and starts during sleep. Continue using your CPAP machine as it is helping you feel better.  -HYPERTENSION: Hypertension means high blood pressure. Your blood pressure has improved, but we need you to send home blood pressure readings every two weeks for monitoring.  -GOUT: Gout is a type of arthritis that causes painful joint inflammation. We will provide you with a list of foods to avoid to help manage your gout.  -DEPRESSION: Depression is a mood disorder that causes persistent feelings of sadness. Continue taking Lexapro as it is helping with your mood.  -COLON CANCER SCREENING: Colon cancer screening is a test to check for cancer in your colon. Expect a call from [facility] to schedule your colonoscopy.  INSTRUCTIONS:  Please follow up in 6 months. Expect a call from  [facility] to schedule your colonoscopy. Send home blood pressure readings in two weeks for monitoring.

## 2023-11-07 NOTE — Assessment & Plan Note (Signed)
No alcohol currently

## 2023-11-07 NOTE — Assessment & Plan Note (Signed)
 Stable mood on lexapro.

## 2023-11-07 NOTE — Assessment & Plan Note (Signed)
Lab Results  Component Value Date   HGBA1C 6.0 02/14/2023

## 2023-11-07 NOTE — Assessment & Plan Note (Signed)
Reports good compliance with cpap.

## 2023-11-07 NOTE — Assessment & Plan Note (Signed)
Recent flare.  Due to renal function, cannot increase allopurinol dose.  Discussed dietary modification and pt was given handout on a low purine diet.

## 2023-11-07 NOTE — Assessment & Plan Note (Signed)
On otc supplement 4000 international units once daily. Update vit D level.

## 2023-11-08 LAB — LIPID PANEL
A1c: 5.8
Cholesterol: 224 — AB (ref 0–200)
EGFR: 34
HDL: 64 (ref 35–70)
LDL Cholesterol: 136
Triglycerides: 135 (ref 40–160)

## 2023-11-08 LAB — COMPREHENSIVE METABOLIC PANEL
Albumin: 3.9 (ref 3.5–5.0)
Calcium: 9.8 (ref 8.7–10.7)
Globulin: 2.5
eGFR: 34

## 2023-11-08 LAB — CBC AND DIFFERENTIAL
HCT: 39 — AB (ref 41–53)
Hemoglobin: 13 — AB (ref 13.5–17.5)
Neutrophils Absolute: 5.7
Platelets: 283 10*3/uL (ref 150–400)
WBC: 7.8

## 2023-11-08 LAB — BASIC METABOLIC PANEL
BUN: 35 — AB (ref 4–21)
CO2: 28 — AB (ref 13–22)
Chloride: 99 (ref 99–108)
Creatinine: 2.4 — AB (ref 0.6–1.3)
Glucose: 105
Potassium: 3.9 meq/L (ref 3.5–5.1)
Sodium: 136 — AB (ref 137–147)

## 2023-11-08 LAB — HEPATIC FUNCTION PANEL
ALT: 19 U/L (ref 10–40)
AST: 22 (ref 14–40)
Alkaline Phosphatase: 108 (ref 25–125)
Bilirubin, Total: 0.4

## 2023-11-08 LAB — VITAMIN D 25 HYDROXY (VIT D DEFICIENCY, FRACTURES): VITD: 35.67 ng/mL (ref 30.00–100.00)

## 2023-11-08 LAB — TSH: TSH: 1.26 (ref 0.41–5.90)

## 2023-11-08 LAB — HEMOGLOBIN A1C: Hemoglobin A1C: 5.8

## 2023-11-08 LAB — CBC: RBC: 4.39 (ref 3.87–5.11)

## 2023-11-09 ENCOUNTER — Encounter: Payer: Self-pay | Admitting: *Deleted

## 2023-11-24 ENCOUNTER — Other Ambulatory Visit (HOSPITAL_COMMUNITY): Payer: Self-pay | Attending: Oncology

## 2023-11-25 DIAGNOSIS — G4733 Obstructive sleep apnea (adult) (pediatric): Secondary | ICD-10-CM | POA: Diagnosis not present

## 2023-12-20 ENCOUNTER — Encounter: Payer: Self-pay | Admitting: Gastroenterology

## 2023-12-26 DIAGNOSIS — G4733 Obstructive sleep apnea (adult) (pediatric): Secondary | ICD-10-CM | POA: Diagnosis not present

## 2024-01-23 DIAGNOSIS — G4733 Obstructive sleep apnea (adult) (pediatric): Secondary | ICD-10-CM | POA: Diagnosis not present

## 2024-02-09 ENCOUNTER — Encounter: Payer: BC Managed Care – PPO | Admitting: Gastroenterology

## 2024-02-23 ENCOUNTER — Other Ambulatory Visit (HOSPITAL_COMMUNITY): Payer: Self-pay

## 2024-02-23 ENCOUNTER — Ambulatory Visit (AMBULATORY_SURGERY_CENTER): Payer: BC Managed Care – PPO

## 2024-02-23 ENCOUNTER — Telehealth: Payer: Self-pay

## 2024-02-23 VITALS — Ht 74.0 in | Wt 240.0 lb

## 2024-02-23 DIAGNOSIS — Z1211 Encounter for screening for malignant neoplasm of colon: Secondary | ICD-10-CM

## 2024-02-23 MED ORDER — SUFLAVE 178.7 G PO SOLR
1.0000 | Freq: Once | ORAL | 0 refills | Status: AC
Start: 2024-02-23 — End: 2024-02-23

## 2024-02-23 NOTE — Progress Notes (Signed)

## 2024-02-23 NOTE — Telephone Encounter (Signed)
 Pharmacy Patient Advocate Encounter   Received notification from CoverMyMeds that prior authorization for Suflave 178.7GM solution is required/requested.   Insurance verification completed.   The patient is insured through [facility] .   Per test claim: PA required; PA submitted to above mentioned insurance via CoverMyMeds Key/confirmation #/EOC [facility] Status is pending

## 2024-02-23 NOTE — Addendum Note (Signed)
 Addended by: Jaquelyn Bitter on: 02/23/2024 10:34 AM   Modules accepted: Orders

## 2024-02-26 NOTE — Telephone Encounter (Signed)
 Pharmacy Patient Advocate Encounter  Received notification from [facility] that Prior Authorization for Suflave 178.7GM solution has been DENIED.  Full denial letter will be uploaded to the media tab. See denial reason below.  This medication is covered when:  The member tried generic bowel preparatory kits (such as generic Nulytely or generic Golytely) and it did not work (the member had an insufficient bowel preparation) OR The member has tried generic Suprep and it did not work (when the member had an insufficient bowel preparation) OR The member risks missing their colonoscopy due to side effects from the generic bowel preparatory kits.  Also, this medication is covered when two alternative medications on the member's formulary have been tried and did not work.   PA #/Case ID/Reference #: [id number]

## 2024-02-27 ENCOUNTER — Other Ambulatory Visit: Payer: Self-pay

## 2024-02-27 DIAGNOSIS — Z1211 Encounter for screening for malignant neoplasm of colon: Secondary | ICD-10-CM

## 2024-02-27 MED ORDER — PEG 3350-KCL-NA BICARB-NACL 420 G PO SOLR: 4000.0000 mL | Freq: Once | ORAL | 0 refills | Status: AC

## 2024-02-27 NOTE — Telephone Encounter (Signed)
 Left detailed message for the patient about the new prescription called into [facility].  Also stated that new instructions were sent in my chart and in the mail.

## 2024-03-05 ENCOUNTER — Encounter: Payer: Self-pay | Admitting: Gastroenterology

## 2024-03-11 ENCOUNTER — Encounter: Payer: Self-pay | Admitting: Gastroenterology

## 2024-03-11 ENCOUNTER — Ambulatory Visit (AMBULATORY_SURGERY_CENTER): Payer: BC Managed Care – PPO | Admitting: Gastroenterology

## 2024-03-11 VITALS — BP 142/89 | HR 63 | Temp 97.7°F | Resp 18 | Ht 73.0 in | Wt 240.0 lb

## 2024-03-11 DIAGNOSIS — D122 Benign neoplasm of ascending colon: Secondary | ICD-10-CM

## 2024-03-11 DIAGNOSIS — Z1211 Encounter for screening for malignant neoplasm of colon: Secondary | ICD-10-CM

## 2024-03-11 MED ORDER — SODIUM CHLORIDE 0.9 % IV SOLN: 500.0000 mL | Freq: Once | INTRAVENOUS | Status: DC

## 2024-03-11 NOTE — Progress Notes (Signed)
 Pt's states no medical or surgical changes since previsit or office visit.

## 2024-03-11 NOTE — Progress Notes (Signed)
 Pt A/O x 3, gd SR's, pleased with anesthesia, report to RN

## 2024-03-11 NOTE — Progress Notes (Signed)
 [facility] History and Physical   Primary Care Physician:  Dorrene Gaucher, NP   Reason for Procedure:   Colon cancer screening  Plan:    Screening colonoscopy     HPI: Taylor Herrera is a 48 y.o. male undergoing initial average risk screening colonoscopy.  He has no family history of colon cancer and no chronic GI symptoms.    Past Medical History:  Diagnosis Date   Essential hypertension 09/16/2014   History of alcohol abuse 08/01/2018   History of kidney stones    HTN (hypertension) 04/23/2015   Hypertension    Migraine    Preventative health care 04/23/2015   Renal disorder     History reviewed. No pertinent surgical history.  Prior to Admission medications   Medication Sig Start Date End Date Taking? Authorizing Provider  allopurinol  (ZYLOPRIM ) 100 MG tablet Take 0.5 tablets (50 mg total) by mouth every other day. 11/07/23  Yes Dorrene Gaucher, NP  carvedilol  (COREG ) 25 MG tablet TAKE 1 TABLET (25 MG TOTAL) BY MOUTH TWICE A DAY WITH MEALS 10/26/23  Yes O'Sullivan, Melissa, NP  Cholecalciferol (HM VITAMIN D3) 100 MCG (4000 UT) CAPS Take 1 tablet by mouth once daily. 04/25/22  Yes O'Sullivan, Melissa, NP  cloNIDine  (CATAPRES ) 0.1 MG tablet Take 1 tablet (0.1 mg total) by mouth 2 (two) times daily. 02/14/23  Yes O'Sullivan, Melissa, NP  escitalopram  (LEXAPRO ) 10 MG tablet Take 1 tablet (10 mg total) by mouth daily. 11/07/23  Yes Dorrene Gaucher, NP    Current Outpatient Medications  Medication Sig Dispense Refill   allopurinol  (ZYLOPRIM ) 100 MG tablet Take 0.5 tablets (50 mg total) by mouth every other day. 21 tablet 4   carvedilol  (COREG ) 25 MG tablet TAKE 1 TABLET (25 MG TOTAL) BY MOUTH TWICE A DAY WITH MEALS 180 tablet 0   Cholecalciferol (HM VITAMIN D3) 100 MCG (4000 UT) CAPS Take 1 tablet by mouth once daily. 30 capsule    cloNIDine  (CATAPRES ) 0.1 MG tablet Take 1 tablet (0.1 mg total) by mouth 2 (two) times daily. 180 tablet 1   escitalopram   (LEXAPRO ) 10 MG tablet Take 1 tablet (10 mg total) by mouth daily. 90 tablet 1   Current Facility-Administered Medications  Medication Dose Route Frequency Provider Last Rate Last Admin   0.9 %  sodium chloride  infusion  500 mL Intravenous Once Elois Hair, MD        Allergies as of 03/11/2024   (No Known Allergies)    Family History  Problem Relation Age of Onset   Cancer Mother        multiple myeloma, ovarian   Hypertension Mother    Hypertension Father    Arrhythmia Father    Kidney disease Father        ESRD on HD   Hypertension Sister    Colon cancer Neg Hx    Colon polyps Neg Hx    Esophageal cancer Neg Hx    Rectal cancer Neg Hx    Stomach cancer Neg Hx     Social History   Socioeconomic History   Marital status: Married    Spouse name: Not on file   Number of children: Not on file   Years of education: Not on file   Highest education level: Bachelor's degree (e.g., BA, AB, BS)  Occupational History   Not on file  Tobacco Use   Smoking status: Never   Smokeless tobacco: Never  Vaping Use   Vaping status: Never Used  Substance and Sexual  Activity   Alcohol use: Not Currently    Comment: none currently   Drug use: No   Sexual activity: Yes    Partners: Female  Other Topics Concern   Not on file  Social History Narrative   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 (one son and one daughter) both healthy   Originally from [place]   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   Completed associates degree   Social Drivers of Health   Financial Resource Strain: Low Risk  (11/06/2023)   Overall Financial Resource Strain (CARDIA)    Difficulty of Paying Living Expenses: Not very hard  Food Insecurity: No Food Insecurity (11/06/2023)   Hunger Vital Sign    Worried About Running Out of Food in the Last Year: Never true    Ran Out of Food in the Last Year: Never true  Transportation Needs: No Transportation Needs (11/06/2023)   PRAPARE - Therapist, art (Medical): No    Lack of Transportation (Non-Medical): No  Physical Activity: Sufficiently Active (11/06/2023)   Exercise Vital Sign    Days of Exercise per Week: 6 days    Minutes of Exercise per Session: 60 min  Stress: No Stress Concern Present (11/06/2023)   [employer] of Occupational Health - Occupational Stress Questionnaire    Feeling of Stress : Not at all  Social Connections: Socially Integrated (11/06/2023)   Social Connection and Isolation Panel [NHANES]    Frequency of Communication with Friends and Family: More than three times a week    Frequency of Social Gatherings with Friends and Family: Once a week    Attends Religious Services: More than 4 times per year    Active Member of [employer] or Organizations: Yes    Attends Banker Meetings: More than 4 times per year    Marital Status: Married  Catering manager Violence: Unknown (02/23/2022)   Received from [employer], [facility]   HITS    Physically Hurt: Not on file    Insult or Talk Down To: Not on file    Threaten Physical Harm: Not on file    Scream or Curse: Not on file    Review of Systems:  All other review of systems negative except as mentioned in the HPI.  Physical Exam: Vital signs BP (!) 153/100   Pulse 64   Temp 97.7 F (36.5 C) (Skin)   Ht 6\' 1"  (1.854 m)   Wt 240 lb (108.9 kg)   SpO2 99%   BMI 31.66 kg/m   General:   Alert,  Well-developed, well-nourished, pleasant and cooperative in NAD Airway:  Mallampati 3 Lungs:  Clear throughout to auscultation.   Heart:  Regular rate and rhythm; no murmurs, clicks, rubs,  or gallops. Abdomen:  Soft, nontender and nondistended. Normal bowel sounds.   Neuro/Psych:  Normal mood and affect. A and O x 3   Mesiah Manzo E. Cherryl Corona, MD [facility]

## 2024-03-11 NOTE — Patient Instructions (Signed)

## 2024-03-11 NOTE — Op Note (Signed)
 [facility] Patient Name: Taylor Herrera Procedure Date: 03/11/2024 8:30 AM MRN: [id number] Endoscopist: Geralyn Knee E. Cherryl Corona , MD, [id number] Age: 48 Referring MD:  Date of Birth: [birth year] Gender: Male Account #: [id number] Procedure:                Colonoscopy Indications:              Screening for colorectal malignant neoplasm, This                            is the patient's first colonoscopy Medicines:                Monitored Anesthesia Care Procedure:                Pre-Anesthesia Assessment:                           - Prior to the procedure, a History and Physical                            was performed, and patient medications and                            allergies were reviewed. The patient's tolerance of                            previous anesthesia was also reviewed. The risks                            and benefits of the procedure and the sedation                            options and risks were discussed with the patient.                            All questions were answered, and informed consent                            was obtained. Prior Anticoagulants: The patient has                            taken no anticoagulant or antiplatelet agents. ASA                            Grade Assessment: III - A patient with severe                            systemic disease. After reviewing the risks and                            benefits, the patient was deemed in satisfactory                            condition to undergo the procedure.  After obtaining informed consent, the colonoscope                            was passed under direct vision. Throughout the                            procedure, the patient's blood pressure, pulse, and                            oxygen saturations were monitored continuously. The                            CF [id number] #[id number] was introduced through the anus                            and advanced  to the the terminal ileum, with                            identification of the appendiceal orifice and IC                            valve. The colonoscopy was performed without                            difficulty. The patient tolerated the procedure                            well. The quality of the bowel preparation was                            good. The terminal ileum, ileocecal valve,                            appendiceal orifice, and rectum were photographed.                            The bowel preparation used was SUFLAVE  via split                            dose instruction. Scope In: 8:38:36 AM Scope Out: 8:52:30 AM Scope Withdrawal Time: 0 hours 11 minutes 42 seconds  Total Procedure Duration: 0 hours 13 minutes 54 seconds  Findings:                 The perianal and digital rectal examinations were                            normal. Pertinent negatives include normal                            sphincter tone and no palpable rectal lesions.                           A 4 mm polyp was found in the ascending colon. The  polyp was sessile. The polyp was removed with a                            cold snare. Resection and retrieval were complete.                            Estimated blood loss was minimal.                           The exam was otherwise normal throughout the                            examined colon.                           The terminal ileum appeared normal.                           The retroflexed view of the distal rectum and anal                            verge was normal and showed no anal or rectal                            abnormalities. Complications:            No immediate complications. Estimated Blood Loss:     Estimated blood loss was minimal. Impression:               - One 4 mm polyp in the ascending colon, removed                            with a cold snare. Resected and retrieved.                           -  The examined portion of the ileum was normal.                           - The distal rectum and anal verge are normal on                            retroflexion view. Recommendation:           - Patient has a contact number available for                            emergencies. The signs and symptoms of potential                            delayed complications were discussed with the                            patient. Return to normal activities tomorrow.  Written discharge instructions were provided to the                            patient.                           - Resume previous diet.                           - Continue present medications.                           - Await pathology results.                           - Repeat colonoscopy (date not yet determined) for                            surveillance based on pathology results. Keyontae Huckeby E. Cherryl Corona, MD 03/11/2024 8:56:39 AM This report has been signed electronically.

## 2024-03-11 NOTE — Progress Notes (Signed)
 Called to room to assist during endoscopic procedure.  Patient ID and intended procedure confirmed with present staff. Received instructions for my participation in the procedure from the performing physician.

## 2024-03-12 ENCOUNTER — Telehealth: Payer: Self-pay

## 2024-03-12 NOTE — Telephone Encounter (Signed)
  Follow up Call-     03/11/2024    8:07 AM  Call back number  Post procedure Call Back phone  # [phone]  Permission to leave phone message Yes     Patient questions:  Do you have a fever, pain , or abdominal swelling? No. Pain Score  0 *  Have you tolerated food without any problems? Yes.    Have you been able to return to your normal activities? Yes.    Do you have any questions about your discharge instructions: Diet   No. Medications  No. Follow up visit  No.  Do you have questions or concerns about your Care? No.  Actions: * If pain score is 4 or above: No action needed, pain <4.

## 2024-03-13 LAB — SURGICAL PATHOLOGY

## 2024-03-14 ENCOUNTER — Encounter: Payer: Self-pay | Admitting: Gastroenterology

## 2024-03-14 NOTE — Progress Notes (Signed)
 Taylor Herrera,  The polyp which I removed during your recent procedure was proven to be completely benign but is considered a "pre-cancerous" polyp that MAY have grown into cancer if it had not been removed.  Studies shows that at least 20% of women over age 46 and 30% of men over age 60 have pre-cancerous polyps.  Based on current nationally recognized surveillance guidelines, I recommend that you have a repeat colonoscopy in 7 years.   If you develop any new rectal bleeding, abdominal pain or significant bowel habit changes, please contact me before then.

## 2024-03-27 DIAGNOSIS — F4389 Other reactions to severe stress: Secondary | ICD-10-CM | POA: Diagnosis not present

## 2024-04-03 DIAGNOSIS — F4389 Other reactions to severe stress: Secondary | ICD-10-CM | POA: Diagnosis not present

## 2024-04-10 DIAGNOSIS — F4389 Other reactions to severe stress: Secondary | ICD-10-CM | POA: Diagnosis not present

## 2024-04-17 DIAGNOSIS — F4389 Other reactions to severe stress: Secondary | ICD-10-CM | POA: Diagnosis not present

## 2024-04-24 DIAGNOSIS — F4389 Other reactions to severe stress: Secondary | ICD-10-CM | POA: Diagnosis not present

## 2024-05-01 DIAGNOSIS — F4389 Other reactions to severe stress: Secondary | ICD-10-CM | POA: Diagnosis not present

## 2024-05-07 ENCOUNTER — Ambulatory Visit: Payer: BC Managed Care – PPO | Admitting: Family

## 2024-05-16 ENCOUNTER — Telehealth: Admitting: Physician Assistant

## 2024-05-16 DIAGNOSIS — F4389 Other reactions to severe stress: Secondary | ICD-10-CM | POA: Diagnosis not present

## 2024-05-16 DIAGNOSIS — M109 Gout, unspecified: Secondary | ICD-10-CM

## 2024-05-16 MED ORDER — PREDNISONE 20 MG PO TABS
40.0000 mg | ORAL_TABLET | Freq: Every day | ORAL | 0 refills | Status: DC
Start: 2024-05-16 — End: 2024-07-02

## 2024-05-16 NOTE — Patient Instructions (Signed)
 Ltanya Minerva, thank you for joining Elsie Velma Lunger, PA-C for today's virtual visit.  While this provider is not your primary care provider (PCP), if your PCP is located in our provider database this encounter information will be shared with them immediately following your visit.   A [facility] MyChart account gives you access to today's visit and all your visits, tests, and labs performed at [facility]  click here if you don't have a [facility] MyChart account or go to [email or web address]  Consent: (Patient) Taylor Herrera provided verbal consent for this virtual visit at the beginning of the encounter.  Current Medications:  Current Outpatient Medications:    predniSONE  (DELTASONE ) 20 MG tablet, Take 2 tablets (40 mg total) by mouth daily with breakfast., Disp: 14 tablet, Rfl: 0   allopurinol  (ZYLOPRIM ) 100 MG tablet, Take 0.5 tablets (50 mg total) by mouth every other day., Disp: 21 tablet, Rfl: 4   carvedilol  (COREG ) 25 MG tablet, TAKE 1 TABLET (25 MG TOTAL) BY MOUTH TWICE A DAY WITH MEALS, Disp: 180 tablet, Rfl: 0   Cholecalciferol (HM VITAMIN D3) 100 MCG (4000 UT) CAPS, Take 1 tablet by mouth once daily., Disp: 30 capsule, Rfl:    cloNIDine  (CATAPRES ) 0.1 MG tablet, Take 1 tablet (0.1 mg total) by mouth 2 (two) times daily., Disp: 180 tablet, Rfl: 1   escitalopram  (LEXAPRO ) 10 MG tablet, Take 1 tablet (10 mg total) by mouth daily., Disp: 90 tablet, Rfl: 1   Medications ordered in this encounter:  Meds ordered this encounter  Medications   predniSONE  (DELTASONE ) 20 MG tablet    Sig: Take 2 tablets (40 mg total) by mouth daily with breakfast.    Dispense:  14 tablet    Refill:  0    Supervising Provider:   BLAISE ALEENE KIDD [phone]     *If you need refills on other medications prior to your next appointment, please contact your pharmacy*  Follow-Up: Call back or seek an in-person evaluation if the symptoms worsen or if the condition fails to improve  as anticipated.   Virtual Care [phone]  Other Instructions  Based on what you shared with me it looks like you have a flare of your gout.  Gout is a form of arthritis. It can cause pain and swelling in the joints. At first, it tends to affect only 1 joint - most frequently the big toe. It happens in people who have too much uric acid in the blood. Uric acid is a chemical that is produced when the body breaks down certain foods. Uric acid can form sharp needle-like crystals that build up in the joints and cause pain. Uric acid crystals can also form inside the tubes that carry urine from the kidneys to the bladder. These crystals can turn into kidney stones that can cause pain and problems with the flow of urine. People with gout get sudden flares or attacks of severe pain, most often the big toe, ankle, or knee. Often the joint also turns red and swells. Usually, only 1 joint is affected, but some people have pain in more than 1 joint. Gout flares tend to happen more often during the night.  The pain from gout can be extreme. The pain and swelling are worst at the beginning of a gout flare. The symptoms then get better within a few days to weeks. It is not clear how the body turns off a gout flare.  Do not start any NEW preventative medicine until the  gout has cleared completely. However, If you are already on Probenecid or Allopurinol  for CHRONIC gout, you may continue taking this during an active flare up  I have prescribed Prednisone  40 mg daily for 7 days -- use for the shortest duration needed.   HOME CARE Losing weight can help relieve gout. It's not clear that following a specific diet plan will help with gout symptoms but eating a balanced diet can help improve your overall health. It can also help you lose weight, if you are overweight. In general, a healthy diet includes plenty of fruits, vegetables, whole grains, and low-fat dairy products (labelled "low fat",  skim, 2%). Avoid sugar sweetened drinks (including sodas, tea, juice and juice blends, coffee drinks and sports drinks) Limit alcohol to 1-2 drinks of beer, spirits or wine daily these can make gout flares worse. Some people with gout also have other health problems, such as heart disease, high blood pressure, kidney disease, or obesity. If you have any of these issues, it's important to work with your doctor to manage them. This can help improve your overall health and might also help with your gout.  GET HELP RIGHT AWAY IF: Your symptoms persist after you have completed your treatment plan You develop severe diarrhea You develop abnormal sensations  You develop vomiting,   You develop weakness  You develop abdominal pain  FOLLOW UP WITH YOUR PRIMARY PROVIDER IF: If your symptoms do not improve within 10 days  MAKE SURE YOU  Understand these instructions. Will watch your condition. Will get help right away if you are not doing well or get worse.      If you have been instructed to have an in-person evaluation today at a local Urgent Care facility, please use the link below. It will take you to a list of all of our available [facility], including address, phone number and hours of operation. Please do not delay care.  [facility]  If you or a family member do not have a primary care provider, use the link below to schedule a visit and establish care. When you choose a [facility] primary care physician or advanced practice provider, you gain a long-term partner in health. Find a Primary Care Provider  Learn more about [facility] in-office and virtual care options: [facility] - Get Care Now

## 2024-05-16 NOTE — Progress Notes (Signed)
 Virtual Visit Consent   Taylor Herrera, you are scheduled for a virtual visit with a [facility] provider today. Just as with appointments in the office, your consent must be obtained to participate. Your consent will be active for this visit and any virtual visit you may have with one of our providers in the next 365 days. If you have a MyChart account, a copy of this consent can be sent to you electronically.  As this is a virtual visit, video technology does not allow for your provider to perform a traditional examination. This may limit your provider's ability to fully assess your condition. If your provider identifies any concerns that need to be evaluated in person or the need to arrange testing (such as labs, EKG, etc.), we will make arrangements to do so. Although advances in technology are sophisticated, we cannot ensure that it will always work on either your end or our end. If the connection with a video visit is poor, the visit may have to be switched to a telephone visit. With either a video or telephone visit, we are not always able to ensure that we have a secure connection.  By engaging in this virtual visit, you consent to the provision of healthcare and authorize for your insurance to be billed (if applicable) for the services provided during this visit. Depending on your insurance coverage, you may receive a charge related to this service.  I need to obtain your verbal consent now. Are you willing to proceed with your visit today? Taylor Herrera has provided verbal consent on 05/16/2024 for a virtual visit (video or telephone). Taylor Herrera, [place]  Date: 05/16/2024 6:30 PM   Virtual Visit via Video Note   I, Taylor Herrera, connected with  Joson Sapp  ([id number], 03-16-1976) on 05/16/24 at  6:30 PM EDT by a video-enabled telemedicine application and verified that I am speaking with the correct person using two identifiers.  Location: Patient: Virtual Visit  Location Patient: Home Provider: Virtual Visit Location Provider: Home Office   I discussed the limitations of evaluation and management by telemedicine and the availability of in person appointments. The patient expressed understanding and agreed to proceed.    History of Present Illness: Taylor Herrera is a 48 y.o. who identifies as a male who was assigned male at birth, and is being seen today for concern for flare of his gout. Notes that symptoms started Saturday in his R wrist and L knee which are common areas for his flare ups. Notes with his job and the heat, has not been able to hydrate as well. Denies change to his diet. Continues to take his allopurinol . Notes often he can feel symptoms starting and he will try to hydrate better and watch diet to stave it off. Notes normal ROM of affected joints but pain. Denies fever, chills. OTC -- Tylenol  (subtherapeutic).  HPI: HPI  Problems:  Patient Active Problem List   Diagnosis Date Noted   Gout 08/09/2022   Hyperglycemia 08/09/2022   Right foot pain 08/04/2022   Sepsis without acute organ dysfunction (HCC) 08/04/2022   Nocturnal hypoxemia 05/03/2022   Vitamin D  deficiency 04/25/2022   Depression 02/04/2022   OSA (obstructive sleep apnea) 08/14/2020   History of alcohol abuse 08/01/2018   Preventative health care 04/23/2015   Migraine 04/23/2015   Essential hypertension 09/16/2014    Allergies: No Known Allergies Medications:  Current Outpatient Medications:    predniSONE  (DELTASONE ) 20 MG tablet, Take 2 tablets (40 mg total)  by mouth daily with breakfast., Disp: 14 tablet, Rfl: 0   allopurinol  (ZYLOPRIM ) 100 MG tablet, Take 0.5 tablets (50 mg total) by mouth every other day., Disp: 21 tablet, Rfl: 4   carvedilol  (COREG ) 25 MG tablet, TAKE 1 TABLET (25 MG TOTAL) BY MOUTH TWICE A DAY WITH MEALS, Disp: 180 tablet, Rfl: 0   Cholecalciferol (HM VITAMIN D3) 100 MCG (4000 UT) CAPS, Take 1 tablet by mouth once daily., Disp: 30 capsule,  Rfl:    cloNIDine  (CATAPRES ) 0.1 MG tablet, Take 1 tablet (0.1 mg total) by mouth 2 (two) times daily., Disp: 180 tablet, Rfl: 1   escitalopram  (LEXAPRO ) 10 MG tablet, Take 1 tablet (10 mg total) by mouth daily., Disp: 90 tablet, Rfl: 1  Observations/Objective: Patient is well-developed, well-nourished in no acute distress.  Resting comfortably at home.  Head is normocephalic, atraumatic.  No labored breathing. Speech is clear and coherent with logical content.  Patient is alert and oriented at baseline.  Redness of the R wrist at carpal joints  noted with normal ROM demonstrated on exam.  Assessment and Plan: 1. Acute gout of multiple sites, unspecified cause (Primary) - predniSONE  (DELTASONE ) 20 MG tablet; Take 2 tablets (40 mg total) by mouth daily with breakfast.  Dispense: 14 tablet; Refill: 0  History. Classic location of flares for him. Occurring despite no change in diet and daily allopurinol  100 mg.Poor hydration possible precipitating factor. Will start burst of prednisone  40 mg up to 7 days (using for shortest duration needed). Ok to use OTC Tylenol  as well. Has upcoming PCP follow-up. May need adjustment in Allopurinol  dosing giving recent flares.  Follow Up Instructions: I discussed the assessment and treatment plan with the patient. The patient was provided an opportunity to ask questions and all were answered. The patient agreed with the plan and demonstrated an understanding of the instructions.  A copy of instructions were sent to the patient via MyChart unless otherwise noted below.   The patient was advised to call back or seek an in-person evaluation if the symptoms worsen or if the condition fails to improve as anticipated.    Taylor Velma Lunger, PA-C

## 2024-05-23 DIAGNOSIS — F4389 Other reactions to severe stress: Secondary | ICD-10-CM | POA: Diagnosis not present

## 2024-06-10 DIAGNOSIS — F4389 Other reactions to severe stress: Secondary | ICD-10-CM | POA: Diagnosis not present

## 2024-06-21 DIAGNOSIS — F411 Generalized anxiety disorder: Secondary | ICD-10-CM | POA: Diagnosis not present

## 2024-06-21 DIAGNOSIS — F1020 Alcohol dependence, uncomplicated: Secondary | ICD-10-CM | POA: Diagnosis not present

## 2024-06-24 DIAGNOSIS — F1020 Alcohol dependence, uncomplicated: Secondary | ICD-10-CM | POA: Diagnosis not present

## 2024-06-24 DIAGNOSIS — F411 Generalized anxiety disorder: Secondary | ICD-10-CM | POA: Diagnosis not present

## 2024-07-01 DIAGNOSIS — F1020 Alcohol dependence, uncomplicated: Secondary | ICD-10-CM | POA: Diagnosis not present

## 2024-07-01 DIAGNOSIS — F411 Generalized anxiety disorder: Secondary | ICD-10-CM | POA: Diagnosis not present

## 2024-07-02 ENCOUNTER — Ambulatory Visit: Admitting: Family

## 2024-07-02 VITALS — BP 107/77 | HR 68 | Temp 97.6°F | Resp 12 | Ht 73.0 in | Wt 239.8 lb

## 2024-07-02 DIAGNOSIS — I10 Essential (primary) hypertension: Secondary | ICD-10-CM | POA: Diagnosis not present

## 2024-07-02 DIAGNOSIS — F1011 Alcohol abuse, in remission: Secondary | ICD-10-CM

## 2024-07-02 DIAGNOSIS — G43809 Other migraine, not intractable, without status migrainosus: Secondary | ICD-10-CM

## 2024-07-02 DIAGNOSIS — D649 Anemia, unspecified: Secondary | ICD-10-CM

## 2024-07-02 DIAGNOSIS — G4733 Obstructive sleep apnea (adult) (pediatric): Secondary | ICD-10-CM

## 2024-07-02 DIAGNOSIS — N1832 Chronic kidney disease, stage 3b: Secondary | ICD-10-CM | POA: Diagnosis not present

## 2024-07-02 DIAGNOSIS — F32A Depression, unspecified: Secondary | ICD-10-CM

## 2024-07-02 DIAGNOSIS — M109 Gout, unspecified: Secondary | ICD-10-CM

## 2024-07-02 DIAGNOSIS — R739 Hyperglycemia, unspecified: Secondary | ICD-10-CM

## 2024-07-02 MED ORDER — ALLOPURINOL 100 MG PO TABS
50.0000 mg | ORAL_TABLET | ORAL | 4 refills | Status: DC
Start: 2024-07-02 — End: 2024-09-27

## 2024-07-02 MED ORDER — CARVEDILOL 25 MG PO TABS: ORAL_TABLET | ORAL | 0 refills | Status: AC

## 2024-07-02 MED ORDER — CLONIDINE HCL 0.1 MG PO TABS: 0.1000 mg | ORAL_TABLET | Freq: Two times a day (BID) | ORAL | 1 refills | Status: AC

## 2024-07-02 MED ORDER — ESCITALOPRAM OXALATE 10 MG PO TABS: 10.0000 mg | ORAL_TABLET | Freq: Every day | ORAL | 1 refills | Status: DC

## 2024-07-02 NOTE — Progress Notes (Signed)
 Subjective:     Patient ID: Taylor Herrera, male    DOB: 1976/11/02, 48 y.o.   MRN: [id number]  Chief Complaint  Patient presents with   Hypertension    Need refills     Hypertension    Discussed the use of AI scribe software for clinical note transcription with the patient, who gave verbal consent to proceed.  History of Present Illness   Nathen Balaban is a 48 year old male with hypertension and sleep apnea who presents for routine follow-up.  He is taking clonidine  and cloridrolol for hypertension. Clonidine  initially caused drowsiness, but he adjusted by taking it later in the day and is now managing well. No recent headaches or migraines.  He uses a CPAP machine regularly for sleep apnea and needs replacements for CPAP supplies, such as masks.  He experienced a gout flare a couple of weeks ago and has since reduced his intake of dietary protein significantly. No alcohol consumption.     Health Maintenance Due  Topic Date Due   Hepatitis B Vaccines (1 of 3 - 19+ 3-dose series) Never done   HPV VACCINES (1 - 3-dose SCDM series) Never done   DTaP/Tdap/Td (2 - Td or Tdap) 05/06/2024    Past Medical History:  Diagnosis Date   Essential hypertension 09/16/2014   History of alcohol abuse 08/01/2018   History of kidney stones    HTN (hypertension) 04/23/2015   Hypertension    Migraine    Preventative health care 04/23/2015   Renal disorder    Sepsis without acute organ dysfunction (HCC) 08/04/2022    No past surgical history on file.  Family History  Problem Relation Age of Onset   Cancer Mother        multiple myeloma, ovarian   Hypertension Mother    Hypertension Father    Arrhythmia Father    Kidney disease Father        ESRD on HD   Hypertension Sister    Colon cancer Neg Hx    Colon polyps Neg Hx    Esophageal cancer Neg Hx    Rectal cancer Neg Hx    Stomach cancer Neg Hx     Social History   Socioeconomic History   Marital status:  Married    Spouse name: Not on file   Number of children: Not on file   Years of education: Not on file   Highest education level: Bachelor's degree (e.g., BA, AB, BS)  Occupational History   Not on file  Tobacco Use   Smoking status: Never   Smokeless tobacco: Never  Vaping Use   Vaping status: Never Used  Substance and Sexual Activity   Alcohol use: Not Currently    Comment: none currently   Drug use: No   Sexual activity: Yes    Partners: Female  Other Topics Concern   Not on file  Social History Narrative   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 (one son and one daughter) both healthy   Originally from [place]   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: Never true  Transportation Needs: No Transportation Needs (04/04/2024)   Received from [facility]   PRAPARE - Transportation    Lack of Transportation (Medical): No    Lack of Transportation (Non-Medical): No  Physical Activity: Insufficiently Active (07/01/2024)   Exercise Vital Sign    Days of Exercise per Week: 4 days    Minutes of Exercise per Session: 30 min  Stress: No Stress Concern Present (07/01/2024)   [employer] of Occupational Health - Occupational Stress Questionnaire    Feeling of Stress: Only a little  Social Connections: Socially Integrated (07/01/2024)   Social Connection and Isolation Panel    Frequency of Communication with Friends and  Family: More than three times a week    Frequency of Social Gatherings with Friends and Family: Twice a week    Attends Religious Services: More than 4 times per year    Active Member of [employer] or Organizations: Yes    Attends Banker Meetings: Not on file    Marital Status: Married  Intimate Partner Violence: Not At Risk (04/04/2024)   Received from [facility]   HITS    Over the last 12 months how often did your partner physically hurt you?: Never    Over the last 12 months how often did your partner insult you or talk down to you?: Never    Over the last 12 months how often did your partner threaten you with physical harm?: Never    Over the last 12 months how often did your partner scream or curse at you?: Never    Outpatient Medications Prior to Visit  Medication Sig Dispense Refill   Cholecalciferol (HM VITAMIN D3) 100 MCG (4000 UT) CAPS Take 1 tablet by mouth once daily. 30 capsule    allopurinol  (ZYLOPRIM ) 100 MG tablet Take 0.5 tablets (50 mg total) by mouth every other day. 21 tablet 4   carvedilol  (COREG ) 25 MG tablet TAKE 1 TABLET (25 MG TOTAL) BY MOUTH TWICE A DAY WITH MEALS 180 tablet 0   cloNIDine  (CATAPRES ) 0.1 MG tablet Take 1 tablet (0.1 mg total) by mouth 2 (two) times daily. 180 tablet 1   escitalopram  (LEXAPRO ) 10 MG tablet Take 1 tablet (10 mg total) by mouth daily. 90 tablet 1   predniSONE  (DELTASONE ) 20 MG tablet Take 2 tablets (40 mg total) by mouth daily with breakfast. 14 tablet 0   No facility-administered medications prior to visit.    No Known Allergies  ROS     Objective:    Physical Exam Constitutional:      General: He is not in acute distress.    Appearance: He is well-developed.  HENT:     Head: Normocephalic and atraumatic.  Cardiovascular:     Rate and Rhythm: Normal rate and regular rhythm.     Heart sounds: No murmur heard. Pulmonary:     Effort: Pulmonary effort is normal. No respiratory distress.     Breath sounds:  Normal breath sounds. No wheezing or rales.  Skin:    General: Skin is warm and dry.  Neurological:     Mental Status: He is alert and oriented to person, place, and time.  Psychiatric:        Behavior: Behavior normal.  Thought Content: Thought content normal.      BP 107/77 (BP Location: Right Arm, Patient Position: Sitting, Cuff Size: Large)   Pulse 68   Temp 97.6 F (36.4 C) (Oral)   Resp 12   Ht 6' 1 (1.854 m)   Wt 239 lb 12.8 oz (108.8 kg)   SpO2 100%   BMI 31.64 kg/m  Wt Readings from Last 3 Encounters:  07/02/24 239 lb 12.8 oz (108.8 kg)  03/11/24 240 lb (108.9 kg)  02/23/24 240 lb (108.9 kg)       Assessment & Plan:   Problem List Items Addressed This Visit       Unprioritized   Stage 3b chronic kidney disease (HCC)   Patient is followed by [facility]. Continue to avoid NSAIDS.      OSA (obstructive sleep apnea)   Reports good compliance with cpap.      Migraine   No recent issues with headaches.       Relevant Medications   escitalopram  (LEXAPRO ) 10 MG tablet   cloNIDine  (CATAPRES ) 0.1 MG tablet   carvedilol  (COREG ) 25 MG tablet   allopurinol  (ZYLOPRIM ) 100 MG tablet   Hyperglycemia   Lab Results  Component Value Date   HGBA1C 5.8 11/08/2023   Last A1C high normal. Continue to work on healthy diet/exercise.      History of alcohol abuse   Abstaining from alcohol.       Gout   Tries to manage with diet as well as allopurinol  at renal dose.      Relevant Medications   allopurinol  (ZYLOPRIM ) 100 MG tablet   Essential hypertension - Primary   Stable on clonidine  and carvedilol .       Relevant Medications   cloNIDine  (CATAPRES ) 0.1 MG tablet   carvedilol  (COREG ) 25 MG tablet   Other Relevant Orders   Basic Metabolic Panel (BMET)   Depression   Reports mood stable on lexapro .       Relevant Medications   escitalopram  (LEXAPRO ) 10 MG tablet   Other Visit Diagnoses       Anemia, unspecified type        Relevant Orders   CBC w/Diff       I have discontinued Keontae Turcott's predniSONE . I am also having him maintain his HM Vitamin D3, escitalopram , cloNIDine , carvedilol , and allopurinol .  Meds ordered this encounter  Medications   escitalopram  (LEXAPRO ) 10 MG tablet    Sig: Take 1 tablet (10 mg total) by mouth daily.    Dispense:  90 tablet    Refill:  1    Supervising Provider:   DOMENICA BLACKBIRD A [[id number]]   cloNIDine  (CATAPRES ) 0.1 MG tablet    Sig: Take 1 tablet (0.1 mg total) by mouth 2 (two) times daily.    Dispense:  180 tablet    Refill:  1    Supervising Provider:   DOMENICA BLACKBIRD A [[id number]]   carvedilol  (COREG ) 25 MG tablet    Sig: TAKE 1 TABLET (25 MG TOTAL) BY MOUTH TWICE A DAY WITH MEALS    Dispense:  180 tablet    Refill:  0    Supervising Provider:   DOMENICA BLACKBIRD A [[id number]]   allopurinol  (ZYLOPRIM ) 100 MG tablet    Sig: Take 0.5 tablets (50 mg total) by mouth every other day.    Dispense:  21 tablet    Refill:  4    Supervising Provider:   DOMENICA BLACKBIRD A [[id number]]

## 2024-07-02 NOTE — Assessment & Plan Note (Signed)
 Reports mood stable on lexapro .

## 2024-07-02 NOTE — Assessment & Plan Note (Signed)
 Abstaining from alcohol.

## 2024-07-02 NOTE — Assessment & Plan Note (Signed)
 Stable on clonidine  and carvedilol .

## 2024-07-02 NOTE — Assessment & Plan Note (Addendum)
 Lab Results  Component Value Date   HGBA1C 5.8 11/08/2023   Last A1C high normal. Continue to work on healthy diet/exercise.

## 2024-07-02 NOTE — Assessment & Plan Note (Signed)
 Reports good compliance with cpap.

## 2024-07-02 NOTE — Assessment & Plan Note (Signed)
>>  ASSESSMENT AND PLAN FOR HISTORY OF ALCOHOL ABUSE WRITTEN ON 07/02/2024  6:21 PM BY O'SULLIVAN, Adaora Mchaney, NP  Abstaining from alcohol.

## 2024-07-02 NOTE — Assessment & Plan Note (Signed)
 No recent issues with headaches.

## 2024-07-03 ENCOUNTER — Encounter: Payer: Self-pay | Admitting: Family

## 2024-07-03 DIAGNOSIS — I129 Hypertensive chronic kidney disease with stage 1 through stage 4 chronic kidney disease, or unspecified chronic kidney disease: Secondary | ICD-10-CM | POA: Insufficient documentation

## 2024-07-03 DIAGNOSIS — N1832 Chronic kidney disease, stage 3b: Secondary | ICD-10-CM | POA: Insufficient documentation

## 2024-07-03 NOTE — Assessment & Plan Note (Signed)
>>  ASSESSMENT AND PLAN FOR STAGE 3B CHRONIC KIDNEY DISEASE (HCC) WRITTEN ON 07/03/2024  1:20 PM BY O'SULLIVAN, Lisvet Rasheed, NP  Patient is followed by [facility]. Continue to avoid NSAIDS.

## 2024-07-03 NOTE — Assessment & Plan Note (Signed)
 Tries to manage with diet as well as allopurinol  at renal dose.

## 2024-07-03 NOTE — Patient Instructions (Signed)
 VISIT SUMMARY:  You had a follow-up visit to manage your hypertension, sleep apnea, chronic kidney disease with anemia, gout, and depression. Your blood pressure is well-controlled, and you are managing your medications effectively. We also discussed your CPAP supplies and recent gout flare.  YOUR PLAN:  HYPERTENSION: Your blood pressure is well-controlled at 107/77 mmHg with your current medications. -Continue taking clonidine  and cloridrolol as prescribed.  OBSTRUCTIVE SLEEP APNEA: You are using your CPAP machine regularly, which is helping with your blood pressure control. -We will contact [facility] to request an order for your CPAP supplies.  CHRONIC KIDNEY DISEASE WITH ANEMIA: You have mild anemia likely due to chronic kidney disease. -We will do a blood count as part of today's lab work. -We will send the lab results to [facility] for further management.  GOUT: You had a recent gout flare and have reduced your intake of certain foods. -Continue to monitor your diet and avoid foods that may trigger gout.  DEPRESSION: Your mood is stable with your current medication. -Continue taking Lexapro  as prescribed.

## 2024-07-03 NOTE — Assessment & Plan Note (Signed)
 Patient is followed by [facility]. Continue to avoid NSAIDS.

## 2024-07-04 ENCOUNTER — Other Ambulatory Visit (INDEPENDENT_AMBULATORY_CARE_PROVIDER_SITE_OTHER)

## 2024-07-04 DIAGNOSIS — D649 Anemia, unspecified: Secondary | ICD-10-CM | POA: Diagnosis not present

## 2024-07-05 ENCOUNTER — Emergency Department (HOSPITAL_BASED_OUTPATIENT_CLINIC_OR_DEPARTMENT_OTHER): Admission: EM | Admit: 2024-07-05 | Discharge: 2024-07-05 | Disposition: A | Discharge status: Home / Self Care

## 2024-07-05 ENCOUNTER — Encounter (HOSPITAL_BASED_OUTPATIENT_CLINIC_OR_DEPARTMENT_OTHER): Payer: Self-pay | Admitting: Emergency Medicine

## 2024-07-05 ENCOUNTER — Other Ambulatory Visit: Payer: Self-pay

## 2024-07-05 ENCOUNTER — Telehealth: Payer: Self-pay | Admitting: Family

## 2024-07-05 DIAGNOSIS — N179 Acute kidney failure, unspecified: Secondary | ICD-10-CM | POA: Diagnosis not present

## 2024-07-05 DIAGNOSIS — I10 Essential (primary) hypertension: Secondary | ICD-10-CM | POA: Diagnosis not present

## 2024-07-05 DIAGNOSIS — R7989 Other specified abnormal findings of blood chemistry: Secondary | ICD-10-CM | POA: Diagnosis not present

## 2024-07-05 DIAGNOSIS — N1831 Chronic kidney disease, stage 3a: Secondary | ICD-10-CM | POA: Insufficient documentation

## 2024-07-05 LAB — CBC WITH DIFFERENTIAL/PLATELET
Abs Immature Granulocytes: 0.02 K/uL (ref 0.00–0.07)
Basophils Absolute: 0.1 K/uL (ref 0.0–0.1)
Basophils Absolute: 0.1 K/uL (ref 0.0–0.1)
Basophils Relative: 0.7 % (ref 0.0–3.0)
Basophils Relative: 1 %
Eosinophils Absolute: 0.2 K/uL (ref 0.0–0.7)
Eosinophils Absolute: 0.2 K/uL (ref 0.0–0.5)
Eosinophils Relative: 2.6 % (ref 0.0–5.0)
Eosinophils Relative: 3 %
HCT: 34.2 % — ABNORMAL LOW (ref 39.0–52.0)
HCT: 33.6 % — ABNORMAL LOW (ref 39.0–52.0)
Hemoglobin: 11.9 g/dL — ABNORMAL LOW (ref 13.0–17.0)
Hemoglobin: 12 g/dL — ABNORMAL LOW (ref 13.0–17.0)
Immature Granulocytes: 0 %
Lymphocytes Relative: 13.3 % (ref 12.0–46.0)
Lymphocytes Relative: 14 %
Lymphs Abs: 1.1 K/uL (ref 0.7–4.0)
Lymphs Abs: 1.3 K/uL (ref 0.7–4.0)
MCH: 31 pg (ref 26.0–34.0)
MCHC: 34.7 g/dL (ref 30.0–36.0)
MCHC: 35.7 g/dL (ref 30.0–36.0)
MCV: 88.3 fl (ref 78.0–100.0)
MCV: 86.8 fL (ref 80.0–100.0)
Monocytes Absolute: 0.7 K/uL (ref 0.1–1.0)
Monocytes Absolute: 0.8 K/uL (ref 0.1–1.0)
Monocytes Relative: 8.6 % (ref 3.0–12.0)
Monocytes Relative: 9 %
Neutro Abs: 6 K/uL (ref 1.4–7.7)
Neutro Abs: 6.4 K/uL (ref 1.7–7.7)
Neutrophils Relative %: 74.8 % (ref 43.0–77.0)
Neutrophils Relative %: 73 %
Platelets: 297 K/uL (ref 150.0–400.0)
Platelets: 291 K/uL (ref 150–400)
RBC: 3.88 Mil/uL — ABNORMAL LOW (ref 4.22–5.81)
RBC: 3.87 MIL/uL — ABNORMAL LOW (ref 4.22–5.81)
RDW: 13.2 % (ref 11.5–15.5)
RDW: 12.4 % (ref 11.5–15.5)
WBC: 8.1 K/uL (ref 4.0–10.5)
WBC: 8.8 K/uL (ref 4.0–10.5)
nRBC: 0 % (ref 0.0–0.2)

## 2024-07-05 LAB — COMPREHENSIVE METABOLIC PANEL WITH GFR
ALT: 11 U/L (ref 0–44)
AST: 20 U/L (ref 15–41)
Albumin: 4 g/dL (ref 3.5–5.0)
Alkaline Phosphatase: 100 U/L (ref 38–126)
Anion gap: 14 (ref 5–15)
BUN: 49 mg/dL — ABNORMAL HIGH (ref 6–20)
CO2: 22 mmol/L (ref 22–32)
Calcium: 9.2 mg/dL (ref 8.9–10.3)
Chloride: 97 mmol/L — ABNORMAL LOW (ref 98–111)
Creatinine, Ser: 4.83 mg/dL — ABNORMAL HIGH (ref 0.61–1.24)
GFR, Estimated: 14 mL/min — ABNORMAL LOW (ref >60)
Glucose, Bld: 137 mg/dL — ABNORMAL HIGH (ref 70–99)
Potassium: 3.8 mmol/L (ref 3.5–5.1)
Sodium: 134 mmol/L — ABNORMAL LOW (ref 135–145)
Total Bilirubin: 0.5 mg/dL (ref 0.0–1.2)
Total Protein: 7.4 g/dL (ref 6.5–8.1)

## 2024-07-05 LAB — BASIC METABOLIC PANEL WITH GFR
BUN: 55 mg/dL — ABNORMAL HIGH (ref 6–23)
CO2: 24 meq/L (ref 19–32)
Calcium: 8.9 mg/dL (ref 8.4–10.5)
Chloride: 94 meq/L — ABNORMAL LOW (ref 96–112)
Creatinine, Ser: 4.34 mg/dL — ABNORMAL HIGH (ref 0.40–1.50)
GFR: 15.59 mL/min — ABNORMAL LOW (ref >60.00)
Glucose, Bld: 85 mg/dL (ref 70–99)
Potassium: 3.9 meq/L (ref 3.5–5.1)
Sodium: 130 meq/L — ABNORMAL LOW (ref 135–145)

## 2024-07-05 MED ORDER — LACTATED RINGERS IV BOLUS
1000.0000 mL | Freq: Once | INTRAVENOUS | Status: AC
  Administered 2024-07-05: 1000 mL via INTRAVENOUS

## 2024-07-05 NOTE — ED Triage Notes (Signed)
 Pt sent by PCP for eval after lab work yesterday showed elevated BUN and creatinine levels. Pt has dry mouth and feels like he hasn't been sweating as much as expected. PMH includes CKD stage IIIa, no dialysis.

## 2024-07-05 NOTE — Telephone Encounter (Signed)
 Spoke to patient.  Lab work showing acute on chronic renal failure. Advised pt to go now to ER for further evaluation and fluids. Pt verbalizes understanding and states that he will go now.

## 2024-07-05 NOTE — Discharge Instructions (Addendum)
 Drink lots of fluids.  Return to the ER if your symptoms worsen.  Make sure you follow-up with your nephrologist next week at your scheduled appointment.

## 2024-07-05 NOTE — ED Provider Notes (Signed)
 [place] [facility] AT [facility] Provider Note   CSN: [id number] Arrival date & time: 07/05/24  1842     Patient presents with: Abnormal Lab   Taylor Herrera is a 47 y.o. male.   47 year old male presents for evaluation of abnormal lab work.  States his primary care doctor drew labs yesterday and told him to come here today because his creatinine was elevated above his usual.  States he does have chronic kidney disease.  States has been working outside more often sweaty more often he feels fatigued and dehydrated.  Denies any change in urinary output.  Denies any other symptoms or concerns at this time.   Abnormal Lab      Prior to Admission medications   Medication Sig Start Date End Date Taking? Authorizing Provider  allopurinol  (ZYLOPRIM ) 100 MG tablet Take 0.5 tablets (50 mg total) by mouth every other day. 07/02/24   O'Sullivan, Melissa, NP  carvedilol  (COREG ) 25 MG tablet TAKE 1 TABLET (25 MG TOTAL) BY MOUTH TWICE A DAY WITH MEALS 07/02/24   O'Sullivan, Melissa, NP  Cholecalciferol (HM VITAMIN D3) 100 MCG (4000 UT) CAPS Take 1 tablet by mouth once daily. 04/25/22   O'Sullivan, Melissa, NP  cloNIDine  (CATAPRES ) 0.1 MG tablet Take 1 tablet (0.1 mg total) by mouth 2 (two) times daily. 07/02/24   O'Sullivan, Melissa, NP  escitalopram  (LEXAPRO ) 10 MG tablet Take 1 tablet (10 mg total) by mouth daily. 07/02/24   O'Sullivan, Melissa, NP    Allergies: Patient has no known allergies.    Review of Systems  Constitutional:  Positive for fatigue. Negative for chills and fever.  HENT:  Negative for ear pain and sore throat.   Eyes:  Negative for pain and visual disturbance.  Respiratory:  Negative for cough and shortness of breath.   Cardiovascular:  Negative for chest pain and palpitations.  Gastrointestinal:  Negative for abdominal pain and vomiting.  Genitourinary:  Negative for dysuria and hematuria.  Musculoskeletal:  Negative for arthralgias and back pain.   Skin:  Negative for color change and rash.  Neurological:  Negative for seizures and syncope.  All other systems reviewed and are negative.   Updated Vital Signs BP 132/88   Pulse 70   Temp 98.7 F (37.1 C) (Oral)   Resp 18   Ht 6' 1 (1.854 m)   Wt 107 kg   SpO2 98%   BMI 31.14 kg/m   Physical Exam Vitals and nursing note reviewed.  Constitutional:      General: He is not in acute distress.    Appearance: He is well-developed. He is not ill-appearing.  HENT:     Head: Normocephalic and atraumatic.  Eyes:     Conjunctiva/sclera: Conjunctivae normal.  Cardiovascular:     Rate and Rhythm: Normal rate and regular rhythm.     Heart sounds: No murmur heard. Pulmonary:     Effort: Pulmonary effort is normal. No respiratory distress.     Breath sounds: Normal breath sounds.  Abdominal:     Palpations: Abdomen is soft.     Tenderness: There is no abdominal tenderness.  Musculoskeletal:        General: No swelling.     Cervical back: Neck supple.  Skin:    General: Skin is warm and dry.     Capillary Refill: Capillary refill takes less than 2 seconds.  Neurological:     Mental Status: He is alert.  Psychiatric:        Mood  and Affect: Mood normal.     (all labs ordered are listed, but only abnormal results are displayed) Labs Reviewed  CBC WITH DIFFERENTIAL/PLATELET - Abnormal; Notable for the following components:      Result Value   RBC 3.87 (*)    Hemoglobin 12.0 (*)    HCT 33.6 (*)    All other components within normal limits  COMPREHENSIVE METABOLIC PANEL WITH GFR - Abnormal; Notable for the following components:   Sodium 134 (*)    Chloride 97 (*)    Glucose, Bld 137 (*)    BUN 49 (*)    Creatinine, Ser 4.83 (*)    GFR, Estimated 14 (*)    All other components within normal limits  URINALYSIS, ROUTINE W REFLEX MICROSCOPIC    EKG: None  Radiology: No results found.   Procedures   Medications Ordered in the ED  lactated ringers  bolus 1,000 mL  (0 mLs Intravenous Stopped 07/05/24 2023)                                    Medical Decision Making Patient was sent in for evaluation of lab abnormality found on labs done yesterday.  Creatinine is elevated.  Does have an AKI.  He is otherwise feeling well with fairly unremarkable otherwise and stable vitals.  Was given a liter of IV fluids.  I offered an admission but he declined has an appointment with his nephrologist next week.  I think this is reasonable.  Advise return for any worsening symptoms.  He feels comfortable being discharged home.  Problems Addressed: AKI (acute kidney injury) (HCC): undiagnosed new problem with uncertain prognosis  Amount and/or Complexity of Data Reviewed External Data Reviewed: notes.    Details: Outpatient records reviewed and patient had labs drawn yesterday had a creatinine of 4.3, baseline seems to be 2.4 Labs: ordered. Decision-making details documented in ED Course.    Details: Ordered and reviewed by me and patient does have an AKI ECG/medicine tests: ordered and independent interpretation performed. Decision-making details documented in ED Course.    Details: Ordered and interpreted me in the absence of cardiology shows sinus rhythm, no STEMI no acute change when compared to prior EKG  Risk OTC drugs. Prescription drug management. Drug therapy requiring intensive monitoring for toxicity.     Final diagnoses:  AKI (acute kidney injury) [employer])    ED Discharge Orders     None          Gennaro Duwaine CROME, DO 07/05/24 2152

## 2024-07-08 DIAGNOSIS — F411 Generalized anxiety disorder: Secondary | ICD-10-CM | POA: Diagnosis not present

## 2024-07-08 DIAGNOSIS — F1020 Alcohol dependence, uncomplicated: Secondary | ICD-10-CM | POA: Diagnosis not present

## 2024-07-11 DIAGNOSIS — N1832 Chronic kidney disease, stage 3b: Secondary | ICD-10-CM | POA: Diagnosis not present

## 2024-07-11 DIAGNOSIS — N182 Chronic kidney disease, stage 2 (mild): Secondary | ICD-10-CM | POA: Diagnosis not present

## 2024-07-12 DIAGNOSIS — N1832 Chronic kidney disease, stage 3b: Secondary | ICD-10-CM | POA: Diagnosis not present

## 2024-07-12 DIAGNOSIS — N2581 Secondary hyperparathyroidism of renal origin: Secondary | ICD-10-CM | POA: Diagnosis not present

## 2024-07-12 DIAGNOSIS — I129 Hypertensive chronic kidney disease with stage 1 through stage 4 chronic kidney disease, or unspecified chronic kidney disease: Secondary | ICD-10-CM | POA: Diagnosis not present

## 2024-07-12 DIAGNOSIS — D631 Anemia in chronic kidney disease: Secondary | ICD-10-CM | POA: Diagnosis not present

## 2024-07-15 DIAGNOSIS — F411 Generalized anxiety disorder: Secondary | ICD-10-CM | POA: Diagnosis not present

## 2024-07-15 DIAGNOSIS — F1020 Alcohol dependence, uncomplicated: Secondary | ICD-10-CM | POA: Diagnosis not present

## 2024-07-22 DIAGNOSIS — F1020 Alcohol dependence, uncomplicated: Secondary | ICD-10-CM | POA: Diagnosis not present

## 2024-07-22 DIAGNOSIS — F411 Generalized anxiety disorder: Secondary | ICD-10-CM | POA: Diagnosis not present

## 2024-07-24 ENCOUNTER — Encounter

## 2024-07-24 ENCOUNTER — Telehealth: Admitting: Family Medicine

## 2024-07-24 DIAGNOSIS — M109 Gout, unspecified: Secondary | ICD-10-CM

## 2024-07-24 MED ORDER — PREDNISONE 20 MG PO TABS
40.0000 mg | ORAL_TABLET | Freq: Every day | ORAL | 0 refills | Status: AC
Start: 2024-07-24 — End: 2024-07-29

## 2024-07-24 NOTE — Progress Notes (Signed)
 Virtual Visit Consent   Taylor Herrera, you are scheduled for a virtual visit with a [facility] provider today. Just as with appointments in the office, your consent must be obtained to participate. Your consent will be active for this visit and any virtual visit you may have with one of our providers in the next 365 days. If you have a MyChart account, a copy of this consent can be sent to you electronically.  As this is a virtual visit, video technology does not allow for your provider to perform a traditional examination. This may limit your provider's ability to fully assess your condition. If your provider identifies any concerns that need to be evaluated in person or the need to arrange testing (such as labs, EKG, etc.), we will make arrangements to do so. Although advances in technology are sophisticated, we cannot ensure that it will always work on either your end or our end. If the connection with a video visit is poor, the visit may have to be switched to a telephone visit. With either a video or telephone visit, we are not always able to ensure that we have a secure connection.  By engaging in this virtual visit, you consent to the provision of healthcare and authorize for your insurance to be billed (if applicable) for the services provided during this visit. Depending on your insurance coverage, you may receive a charge related to this service.  I need to obtain your verbal consent now. Are you willing to proceed with your visit today? Taylor Herrera has provided verbal consent on 07/24/2024 for a virtual visit (video or telephone). Taylor Lamp, FNP  Date: 07/24/2024 4:44 PM   Virtual Visit via Video Note   I, Taylor Herrera, connected with  Taylor Herrera  ([id number], 1976-08-06) on 07/24/24 at  4:45 PM EDT by a video-enabled telemedicine application and verified that I am speaking with the correct person using two identifiers.  Location: Patient: Virtual Visit Location Patient:  Home Provider: Virtual Visit Location Provider: Home Office   I discussed the limitations of evaluation and management by telemedicine and the availability of in person appointments. The patient expressed understanding and agreed to proceed.    History of Present Illness: Taylor Herrera is a 48 y.o. who identifies as a male who was assigned male at birth, and is being seen today for a gout flare in rt great toe for 2 days worsening. Follows with nephrology who recently changed his allopurinol . He has been watching diet carefully. He says prednisone  worked well.   HPI: HPI  Problems:  Patient Active Problem List   Diagnosis Date Noted   Stage 3b chronic kidney disease (HCC) 07/03/2024   Gout 08/09/2022   Hyperglycemia 08/09/2022   Vitamin D  deficiency 04/25/2022   Depression 02/04/2022   OSA (obstructive sleep apnea) 08/14/2020   History of alcohol abuse 08/01/2018   Preventative health care 04/23/2015   Migraine 04/23/2015   Essential hypertension 09/16/2014    Allergies: No Known Allergies Medications:  Current Outpatient Medications:    predniSONE  (DELTASONE ) 20 MG tablet, Take 2 tablets (40 mg total) by mouth daily with breakfast for 5 days., Disp: 10 tablet, Rfl: 0   allopurinol  (ZYLOPRIM ) 100 MG tablet, Take 0.5 tablets (50 mg total) by mouth every other day., Disp: 21 tablet, Rfl: 4   carvedilol  (COREG ) 25 MG tablet, TAKE 1 TABLET (25 MG TOTAL) BY MOUTH TWICE A DAY WITH MEALS, Disp: 180 tablet, Rfl: 0   Cholecalciferol (HM VITAMIN D3) 100 MCG (  4000 UT) CAPS, Take 1 tablet by mouth once daily., Disp: 30 capsule, Rfl:    cloNIDine  (CATAPRES ) 0.1 MG tablet, Take 1 tablet (0.1 mg total) by mouth 2 (two) times daily., Disp: 180 tablet, Rfl: 1   escitalopram  (LEXAPRO ) 10 MG tablet, Take 1 tablet (10 mg total) by mouth daily., Disp: 90 tablet, Rfl: 1  Observations/Objective: Patient is well-developed, well-nourished in no acute distress.  Resting comfortably  at home.  Head is  normocephalic, atraumatic.  No labored breathing.  Speech is clear and coherent with logical content.  Patient is alert and oriented at baseline.    Assessment and Plan: 1. Acute gout of multiple sites, unspecified cause (Primary)  Continue to follow with nephrology who recently changed meds. UC if sx persist or worsen.   Follow Up Instructions: I discussed the assessment and treatment plan with the patient. The patient was provided an opportunity to ask questions and all were answered. The patient agreed with the plan and demonstrated an understanding of the instructions.  A copy of instructions were sent to the patient via MyChart unless otherwise noted below.     The patient was advised to call back or seek an in-person evaluation if the symptoms worsen or if the condition fails to improve as anticipated.    Taylor Ferrara, FNP

## 2024-07-24 NOTE — Patient Instructions (Signed)
Gout  Gout is a condition that causes painful swelling of the joints. Gout is a type of inflammation of the joints (arthritis). This condition is caused by having too much uric acid in the body. Uric acid is a chemical that forms when the body breaks down substances called purines. Purines are important for building body proteins. When the body has too much uric acid, sharp crystals can form and build up inside the joints. This causes pain and swelling. Gout attacks can happen quickly and may be very painful (acute gout). Over time, the attacks can affect more joints and become more frequent (chronic gout). Gout can also cause uric acid to build up under the skin and inside the kidneys. What are the causes? This condition is caused by too much uric acid in your blood. This can happen because: Your kidneys do not remove enough uric acid from your blood. This is the most common cause. Your body makes too much uric acid. This can happen with some cancers and cancer treatments. It can also occur if your body is breaking down too many red blood cells (hemolytic anemia). You eat too many foods that are high in purines. These foods include organ meats and some seafood. Alcohol, especially beer, is also high in purines. A gout attack may be triggered by trauma or stress. What increases the risk? The following factors may make you more likely to develop this condition: Having a family history of gout. Being male and middle-aged. Being male and having gone through menopause. Taking certain medicines, including aspirin, cyclosporine, diuretics, levodopa, and niacin. Having an organ transplant. Having certain conditions, such as: Being obese. Lead poisoning. Kidney disease. A skin condition called psoriasis. Other factors include: Losing weight too quickly. Being dehydrated. Frequently drinking alcohol, especially beer. Frequently drinking beverages that are sweetened with a type of sugar called  fructose. What are the signs or symptoms? An attack of acute gout happens quickly. It usually occurs in just one joint. The most common place is the big toe. Attacks often start at night. Other joints that may be affected include joints of the feet, ankle, knee, fingers, wrist, or elbow. Symptoms of this condition may include: Severe pain. Warmth. Swelling. Stiffness. Tenderness. The affected joint may be very painful to touch. Shiny, red, or purple skin. Chills and fever. Chronic gout may cause symptoms more frequently. More joints may be involved. You may also have white or yellow lumps (tophi) on your hands or feet or in other areas near your joints. How is this diagnosed? This condition is diagnosed based on your symptoms, your medical history, and a physical exam. You may have tests, such as: Blood tests to measure uric acid levels. Removal of joint fluid with a thin needle (aspiration) to look for uric acid crystals. X-rays to look for joint damage. How is this treated? Treatment for this condition has two phases: treating an acute attack and preventing future attacks. Acute gout treatment may include medicines to reduce pain and swelling, including: NSAIDs, such as ibuprofen. Steroids. These are strong anti-inflammatory medicines that can be taken by mouth (orally) or injected into a joint. Colchicine. This medicine relieves pain and swelling when it is taken soon after an attack. It can be given by mouth or through an IV. Preventive treatment may include: Daily use of smaller doses of NSAIDs or colchicine. Use of a medicine that reduces uric acid levels in your blood, such as allopurinol. Changes to your diet. You may need to see   a dietitian about what to eat and drink to prevent gout. Follow these instructions at home: During a gout attack  If directed, put ice on the affected area. To do this: Put ice in a plastic bag. Place a towel between your skin and the bag. Leave the  ice on for 20 minutes, 2-3 times a day. Remove the ice if your skin turns bright red. This is very important. If you cannot feel pain, heat, or cold, you have a greater risk of damage to the area. Raise (elevate) the affected joint above the level of your heart as often as possible. Rest the joint as much as possible. If the affected joint is in your leg, you may be given crutches to use. Follow instructions from your health care provider about eating or drinking restrictions. Avoiding future gout attacks Follow a low-purine diet as told by your dietitian or health care provider. Avoid foods and drinks that are high in purines, including liver, kidney, anchovies, asparagus, herring, mushrooms, mussels, and beer. Maintain a healthy weight or lose weight if you are overweight. If you want to lose weight, talk with your health care provider. Do not lose weight too quickly. Start or maintain an exercise program as told by your health care provider. Eating and drinking Avoid drinking beverages that contain fructose. Drink enough fluids to keep your urine pale yellow. If you drink alcohol: Limit how much you have to: 0-1 drink a day for women who are not pregnant. 0-2 drinks a day for men. Know how much alcohol is in a drink. In the [place] one drink equals one 12 oz bottle of beer (355 mL), one 5 oz glass of wine (148 mL), or one 1 oz glass of hard liquor (44 mL). General instructions Take over-the-counter and prescription medicines only as told by your health care provider. Ask your health care provider if the medicine prescribed to you requires you to avoid driving or using machinery. Return to your normal activities as told by your health care provider. Ask your health care provider what activities are safe for you. Keep all follow-up visits. This is important. Where to find more information National Institutes of Health: www.niams.nih.gov Contact a health care provider if you have: Another  gout attack. Continuing symptoms of a gout attack after 10 days of treatment. Side effects from your medicines. Chills or a fever. Burning pain when you urinate. Pain in your lower back or abdomen. Get help right away if you: Have severe or uncontrolled pain. Cannot urinate. Summary Gout is painful swelling of the joints caused by having too much uric acid in the body. The most common site for gout to occur is in the big toe, but it can affect other joints in the body. Medicines and dietary changes can help to prevent and treat gout attacks. This information is not intended to replace advice given to you by your health care provider. Make sure you discuss any questions you have with your health care provider. Document Revised: 08/11/2021 Document Reviewed: 08/11/2021 Elsevier Patient Education  2024 [employer]  

## 2024-07-25 ENCOUNTER — Ambulatory Visit: Admitting: Skilled Nursing Facility1

## 2024-08-30 DIAGNOSIS — D631 Anemia in chronic kidney disease: Secondary | ICD-10-CM | POA: Diagnosis not present

## 2024-08-30 DIAGNOSIS — N1832 Chronic kidney disease, stage 3b: Secondary | ICD-10-CM | POA: Diagnosis not present

## 2024-08-30 DIAGNOSIS — N2581 Secondary hyperparathyroidism of renal origin: Secondary | ICD-10-CM | POA: Diagnosis not present

## 2024-08-30 DIAGNOSIS — I129 Hypertensive chronic kidney disease with stage 1 through stage 4 chronic kidney disease, or unspecified chronic kidney disease: Secondary | ICD-10-CM | POA: Diagnosis not present

## 2024-09-02 DIAGNOSIS — F1020 Alcohol dependence, uncomplicated: Secondary | ICD-10-CM | POA: Diagnosis not present

## 2024-09-02 DIAGNOSIS — F411 Generalized anxiety disorder: Secondary | ICD-10-CM | POA: Diagnosis not present

## 2024-09-03 ENCOUNTER — Other Ambulatory Visit: Payer: Self-pay | Admitting: Nephrology

## 2024-09-03 DIAGNOSIS — N1832 Chronic kidney disease, stage 3b: Secondary | ICD-10-CM

## 2024-09-05 ENCOUNTER — Ambulatory Visit
Admission: RE | Admit: 2024-09-05 | Discharge: 2024-09-05 | Disposition: A | Discharge status: Home / Self Care | Source: Ambulatory Visit | Attending: Nephrology | Admitting: Nephrology

## 2024-09-05 DIAGNOSIS — N1832 Chronic kidney disease, stage 3b: Secondary | ICD-10-CM | POA: Diagnosis not present

## 2024-09-09 DIAGNOSIS — F411 Generalized anxiety disorder: Secondary | ICD-10-CM | POA: Diagnosis not present

## 2024-09-09 DIAGNOSIS — F1020 Alcohol dependence, uncomplicated: Secondary | ICD-10-CM | POA: Diagnosis not present

## 2024-09-13 ENCOUNTER — Encounter: Payer: Self-pay | Admitting: Family

## 2024-09-16 DIAGNOSIS — F411 Generalized anxiety disorder: Secondary | ICD-10-CM | POA: Diagnosis not present

## 2024-09-16 DIAGNOSIS — F1020 Alcohol dependence, uncomplicated: Secondary | ICD-10-CM | POA: Diagnosis not present

## 2024-09-23 DIAGNOSIS — F411 Generalized anxiety disorder: Secondary | ICD-10-CM | POA: Diagnosis not present

## 2024-09-23 DIAGNOSIS — F1020 Alcohol dependence, uncomplicated: Secondary | ICD-10-CM | POA: Diagnosis not present

## 2024-09-24 ENCOUNTER — Other Ambulatory Visit: Payer: Self-pay | Admitting: Medical Genetics

## 2024-09-24 DIAGNOSIS — Z006 Encounter for examination for normal comparison and control in clinical research program: Secondary | ICD-10-CM

## 2024-09-27 ENCOUNTER — Ambulatory Visit: Admitting: Family

## 2024-09-27 VITALS — BP 119/87 | HR 80 | Temp 98.6°F | Resp 16 | Ht 73.0 in | Wt 228.0 lb

## 2024-09-27 DIAGNOSIS — I129 Hypertensive chronic kidney disease with stage 1 through stage 4 chronic kidney disease, or unspecified chronic kidney disease: Secondary | ICD-10-CM

## 2024-09-27 DIAGNOSIS — F1090 Alcohol use, unspecified, uncomplicated: Secondary | ICD-10-CM | POA: Diagnosis not present

## 2024-09-27 DIAGNOSIS — I10 Essential (primary) hypertension: Secondary | ICD-10-CM

## 2024-09-27 DIAGNOSIS — F32A Depression, unspecified: Secondary | ICD-10-CM | POA: Diagnosis not present

## 2024-09-27 DIAGNOSIS — F1011 Alcohol abuse, in remission: Secondary | ICD-10-CM

## 2024-09-27 DIAGNOSIS — N184 Chronic kidney disease, stage 4 (severe): Secondary | ICD-10-CM

## 2024-09-27 MED ORDER — NALTREXONE HCL 50 MG PO TABS: ORAL_TABLET | ORAL | 0 refills | Status: AC

## 2024-09-27 MED ORDER — ESCITALOPRAM OXALATE 20 MG PO TABS: 20.0000 mg | ORAL_TABLET | Freq: Every day | ORAL | 0 refills | Status: AC

## 2024-09-27 NOTE — Assessment & Plan Note (Signed)
  Recent exacerbation. Abstinent for three weeks. Interested in naltrexone. - Prescribed naltrexone 50 mg, instructed to cut in half for first 3-4 days, then increase to full tablet if tolerated. - Follow-up in 2-3 weeks to assess progress, side effects, and cravings. - Consider dose adjustment based on follow-up report.

## 2024-09-27 NOTE — Assessment & Plan Note (Signed)
 Maintianed on carvedilol , clonidine , amlodipine -omestrartan.

## 2024-09-27 NOTE — Assessment & Plan Note (Signed)
>>  ASSESSMENT AND PLAN FOR HISTORY OF ALCOHOL ABUSE WRITTEN ON 09/27/2024  3:34 PM BY O'SULLIVAN, Mela Perham, NP   Recent exacerbation. Abstinent for three weeks. Interested in naltrexone. - Prescribed naltrexone 50 mg, instructed to cut in half for first 3-4 days, then increase to full tablet if tolerated. - Follow-up in 2-3 weeks to assess progress, side effects, and cravings. - Consider dose adjustment based on follow-up report.

## 2024-09-27 NOTE — Progress Notes (Signed)
 Subjective:     Patient ID: Taylor Herrera, male    DOB: [birth year], 48 y.o.   MRN: [id number]  Chief Complaint  Patient presents with   Hypertension    Here for follow up    HPI  Discussed the use of AI scribe software for clinical note transcription with the patient, who gave verbal consent to proceed.  History of Present Illness  Taylor Herrera is a 48 year old male who presents for follow-up regarding naltrexone therapy.  He has struggled with alcohol use recently and finds willpower alone insufficient to manage his drinking. He has abstained from alcohol for the past three weeks and is committed to continuing abstinence, particularly due to concerns about kidney health. His therapist recommended naltrexone, but he delayed seeking treatment until now.  He denies recent major stressors or specific triggers, attributing struggles to general life circumstances. He is not using any other substances, including marijuana.  Current medications include carvedilol , clonidine , and amlodipine  for blood pressure, and Lexapro  10 mg for anxiety or depression. He experiences no panic attacks or difficulty with motivation for daily activities.    Health Maintenance Due  Topic Date Due   Pneumococcal Vaccine (1 of 2 - PCV) Never done   Hepatitis B Vaccines 19-59 Average Risk (1 of 3 - 19+ 3-dose series) Never done   HPV VACCINES (1 - 3-dose SCDM series) Never done   DTaP/Tdap/Td (2 - Td or Tdap) 05/06/2024    Past Medical History:  Diagnosis Date   Essential hypertension 09/16/2014   History of alcohol abuse 08/01/2018   History of kidney stones    HTN (hypertension) 04/23/2015   Hypertension    Migraine    Preventative health care 04/23/2015   Renal disorder    Sepsis without acute organ dysfunction (HCC) 08/04/2022    No past surgical history on file.  Family History  Problem Relation Age of Onset   Cancer Mother        multiple myeloma, ovarian   Hypertension Mother     Hypertension Father    Arrhythmia Father    Kidney disease Father        ESRD on HD   Hypertension Sister    Colon cancer Neg Hx    Colon polyps Neg Hx    Esophageal cancer Neg Hx    Rectal cancer Neg Hx    Stomach cancer Neg Hx     Social History   Socioeconomic History   Marital status: Married    Spouse name: Not on file   Number of children: Not on file   Years of education: Not on file   Highest education level: Bachelor's degree (e.g., BA, AB, BS)  Occupational History   Not on file  Tobacco Use   Smoking status: Never   Smokeless tobacco: Never  Vaping Use   Vaping status: Never Used  Substance and Sexual Activity   Alcohol use: Not Currently    Comment: none currently   Drug use: No   Sexual activity: Yes    Partners: Female  Other Topics Concern   Not on file  Social History Narrative   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 (one son and one daughter) both healthy   Originally from [place]   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   Completed associates degree   Social Drivers of Health   Financial Resource Strain: Low Risk  (04/04/2024)   Received from [facility]   Overall Financial Resource Strain (CARDIA)  Difficulty of Paying Living Expenses: Not very hard  Food Insecurity: No Food Insecurity (04/04/2024)   Received from [facility]   Hunger Vital Sign    Within the past 12 months, you worried that your food would run out before you got the money to buy more.: Never true    Within the past 12 months, the food you bought just didn't last and you didn't have money to get more.: Never true  Transportation Needs: No Transportation Needs (04/04/2024)   Received from [facility] - Transportation    Lack of Transportation (Medical): No    Lack of Transportation (Non-Medical): No  Physical Activity: Insufficiently Active (07/01/2024)   Exercise Vital Sign    Days of Exercise per Week: 4 days    Minutes of Exercise per Session: 30 min   Stress: No Stress Concern Present (07/01/2024)   [employer] of [employer] - Occupational Stress Questionnaire    Feeling of Stress: Only a little  Social Connections: Socially Integrated (07/01/2024)   Social Connection and Isolation Panel    Frequency of Communication with Friends and Family: More than three times a week    Frequency of Social Gatherings with Friends and Family: Twice a week    Attends Religious Services: More than 4 times per year    Active Member of [employer] or Organizations: Yes    Attends Banker Meetings: Not on file    Marital Status: Married  Intimate Partner Violence: Not At Risk (04/04/2024)   Received from [facility]   HITS    Over the last 12 months how often did your partner physically hurt you?: Never    Over the last 12 months how often did your partner insult you or talk down to you?: Never    Over the last 12 months how often did your partner threaten you with physical harm?: Never    Over the last 12 months how often did your partner scream or curse at you?: Never    Outpatient Medications Prior to Visit  Medication Sig Dispense Refill   amLODipine  (NORVASC ) 10 MG tablet Take 10 mg by mouth daily.     carvedilol  (COREG ) 25 MG tablet TAKE 1 TABLET (25 MG TOTAL) BY MOUTH TWICE A DAY WITH MEALS 180 tablet 0   Cholecalciferol (HM VITAMIN D3) 100 MCG (4000 UT) CAPS Take 1 tablet by mouth once daily. 30 capsule    cloNIDine  (CATAPRES ) 0.1 MG tablet Take 1 tablet (0.1 mg total) by mouth 2 (two) times daily. 180 tablet 1   febuxostat (ULORIC) 40 MG tablet Take 40 mg by mouth daily.     escitalopram  (LEXAPRO ) 10 MG tablet Take 1 tablet (10 mg total) by mouth daily. 90 tablet 1   allopurinol  (ZYLOPRIM ) 100 MG tablet Take 0.5 tablets (50 mg total) by mouth every other day. 21 tablet 4   No facility-administered medications prior to visit.    No Known Allergies  ROS See HPI    Objective:    Physical Exam Constitutional:       General: He is not in acute distress.    Appearance: He is well-developed.  HENT:     Head: Normocephalic and atraumatic.  Cardiovascular:     Rate and Rhythm: Normal rate and regular rhythm.     Heart sounds: No murmur heard. Pulmonary:     Effort: Pulmonary effort is normal. No respiratory distress.     Breath sounds: Normal breath sounds. No wheezing or  rales.  Skin:    General: Skin is warm and dry.  Neurological:     Mental Status: He is alert and oriented to person, place, and time.  Psychiatric:        Behavior: Behavior normal.        Thought Content: Thought content normal.      BP 119/87 (BP Location: Left Arm, Patient Position: Sitting, Cuff Size: Large)   Pulse 80   Temp 98.6 F (37 C) (Oral)   Resp 16   Ht 6' 1 (1.854 m)   Wt 228 lb (103.4 kg)   SpO2 100%   BMI 30.08 kg/m  Wt Readings from Last 3 Encounters:  09/27/24 228 lb (103.4 kg)  07/05/24 236 lb (107 kg)  07/02/24 239 lb 12.8 oz (108.8 kg)       Assessment & Plan:   Problem List Items Addressed This Visit       Unprioritized   Essential hypertension   Maintianed on carvedilol , clonidine , amlodipine -omestrartan.        Relevant Medications   amLODipine  (NORVASC ) 10 MG tablet   Depression   Will try increasing his lexapro  from 10mg  to 20mg  to see if this helps some with his desire to drink as well.       Relevant Medications   escitalopram  (LEXAPRO ) 20 MG tablet   CKD stage 4 secondary to hypertension [facility])   He is following closely with Nephrology. Update renal panel today.       Alcohol use disorder - Primary    >>ASSESSMENT AND PLAN FOR HISTORY OF ALCOHOL ABUSE WRITTEN ON 09/27/2024  3:34 PM BY Taylor Herrera, Taylor Supan, Taylor Herrera   Recent exacerbation. Abstinent for three weeks. Interested in naltrexone. - Prescribed naltrexone 50 mg, instructed to cut in half for first 3-4 days, then increase to full tablet if tolerated. - Follow-up in 2-3 weeks to assess progress, side effects, and  cravings. - Consider dose adjustment based on follow-up report.       Relevant Medications   naltrexone (DEPADE) 50 MG tablet   Other Visit Diagnoses       History of alcohol abuse           I have discontinued Taylor Herrera's escitalopram  and allopurinol . I am also having him start on naltrexone and escitalopram . Additionally, I am having him maintain his HM Vitamin D3, cloNIDine , carvedilol , amLODipine , and febuxostat.  Meds ordered this encounter  Medications   naltrexone (DEPADE) 50 MG tablet    Sig: 1/2 tab once daily for 3 days, then increase to a full tab once daily on day 4 as tolerated.    Dispense:  30 tablet    Refill:  0    Supervising Provider:   DOMENICA BLACKBIRD A [[id number]]   escitalopram  (LEXAPRO ) 20 MG tablet    Sig: Take 1 tablet (20 mg total) by mouth daily.    Dispense:  90 tablet    Refill:  0    Supervising Provider:   DOMENICA BLACKBIRD A [[id number]]

## 2024-09-27 NOTE — Assessment & Plan Note (Signed)
 He is following closely with Nephrology. Update renal panel today.

## 2024-09-27 NOTE — Patient Instructions (Signed)
 VISIT SUMMARY:  Today we discussed your recent struggles with alcohol use and your commitment to abstinence. We also reviewed your blood pressure management and depression treatment.  YOUR PLAN:  ALCOHOL USE DISORDER: You have been abstinent from alcohol for three weeks and are interested in starting naltrexone to help maintain this. -Start naltrexone 50 mg, cut the tablet in half for the first 3-4 days, then take a full tablet if tolerated. -Follow up in 2-3 weeks to assess your progress, side effects, and cravings.  ESSENTIAL HYPERTENSION: Your blood pressure is well-controlled with your current medications. -Continue taking carvedilol , clonidine , and amlodipine  as prescribed.  DEPRESSION: You are currently taking Lexapro  10 mg with no panic attacks or motivational issues. -Increase Lexapro  to 20 mg daily. -Follow up in 2-3 weeks to assess your response to the increased dose.

## 2024-09-27 NOTE — Assessment & Plan Note (Signed)
 Will try increasing his lexapro  from 10mg  to 20mg  to see if this helps some with his desire to drink as well.

## 2024-09-30 DIAGNOSIS — F411 Generalized anxiety disorder: Secondary | ICD-10-CM | POA: Diagnosis not present

## 2024-09-30 DIAGNOSIS — F1020 Alcohol dependence, uncomplicated: Secondary | ICD-10-CM | POA: Diagnosis not present

## 2024-10-07 DIAGNOSIS — F411 Generalized anxiety disorder: Secondary | ICD-10-CM | POA: Diagnosis not present

## 2024-10-07 DIAGNOSIS — F1020 Alcohol dependence, uncomplicated: Secondary | ICD-10-CM | POA: Diagnosis not present

## 2024-10-14 DIAGNOSIS — F411 Generalized anxiety disorder: Secondary | ICD-10-CM | POA: Diagnosis not present

## 2024-10-14 DIAGNOSIS — F1020 Alcohol dependence, uncomplicated: Secondary | ICD-10-CM | POA: Diagnosis not present

## 2024-10-21 DIAGNOSIS — F1020 Alcohol dependence, uncomplicated: Secondary | ICD-10-CM | POA: Diagnosis not present

## 2024-10-21 DIAGNOSIS — F411 Generalized anxiety disorder: Secondary | ICD-10-CM | POA: Diagnosis not present

## 2024-11-04 ENCOUNTER — Telehealth: Admitting: Family Medicine

## 2024-11-04 ENCOUNTER — Encounter

## 2024-11-04 DIAGNOSIS — M1A09X0 Idiopathic chronic gout, multiple sites, without tophus (tophi): Secondary | ICD-10-CM | POA: Diagnosis not present

## 2024-11-04 MED ORDER — PREDNISONE 10 MG (21) PO TBPK: ORAL_TABLET | ORAL | 0 refills | Status: DC

## 2024-11-04 NOTE — Patient Instructions (Addendum)
 Taylor Herrera, thank you for joining Taylor CHRISTELLA Barefoot, NP for today's virtual visit.  While this provider is not your primary care provider (PCP), if your PCP is located in our provider database this encounter information will be shared with them immediately following your visit.   A [facility] MyChart account gives you access to today's visit and all your visits, tests, and labs performed at [facility]  click here if you don't have a [name] MyChart account or go to [email or web address]  Consent: (Patient) Taylor Herrera provided verbal consent for this virtual visit at the beginning of the encounter.  Current Medications:  Current Outpatient Medications:    predniSONE  (STERAPRED UNI-PAK 21 TAB) 10 MG (21) TBPK tablet, Take as directed, Disp: 21 tablet, Rfl: 0   amLODipine  (NORVASC ) 10 MG tablet, Take 10 mg by mouth daily., Disp: , Rfl:    carvedilol  (COREG ) 25 MG tablet, TAKE 1 TABLET (25 MG TOTAL) BY MOUTH TWICE A DAY WITH MEALS, Disp: 180 tablet, Rfl: 0   Cholecalciferol (HM VITAMIN D3) 100 MCG (4000 UT) CAPS, Take 1 tablet by mouth once daily., Disp: 30 capsule, Rfl:    cloNIDine  (CATAPRES ) 0.1 MG tablet, Take 1 tablet (0.1 mg total) by mouth 2 (two) times daily., Disp: 180 tablet, Rfl: 1   escitalopram  (LEXAPRO ) 20 MG tablet, Take 1 tablet (20 mg total) by mouth daily., Disp: 90 tablet, Rfl: 0   febuxostat (ULORIC) 40 MG tablet, Take 40 mg by mouth daily., Disp: , Rfl:    naltrexone  (DEPADE) 50 MG tablet, 1/2 tab once daily for 3 days, then increase to a full tab once daily on day 4 as tolerated., Disp: 30 tablet, Rfl: 0   Medications ordered in this encounter:  Meds ordered this encounter  Medications   predniSONE  (STERAPRED UNI-PAK 21 TAB) 10 MG (21) TBPK tablet    Sig: Take as directed    Dispense:  21 tablet    Refill:  0    Supervising Provider:   BLAISE ALEENE Herrera [[id number]]     *If you need refills on other medications prior to your next  appointment, please contact your pharmacy*  Follow-Up: Call back or seek an in-person evaluation if the symptoms worsen or if the condition fails to improve as anticipated.  [facility] [phone]  Other Instructions  Low-Purine Diet A low-purine diet involves making choices to limit how much purine you take in. Purine is a kind of uric acid. If you have too much uric acid in your blood, it can cause problems like gout and kidney stones. Eating a low-purine diet can help control those problems. What are tips for following this plan? Shopping Avoid buying things that have high-fructose corn syrup in them. Check for this on food labels. Be sure to check for it in: [employer], such as cookies. Canned fruits. Cereals and cereal bars. Avoid buying meats with a high purine content. These include: Veal. Chicken breast with skin. Lamb. Organ meats, such as liver. Choose dairy products. These may lower uric acid levels. Avoid buying certain types of fish. These include: Anchovies. Trout. Tuna. Sardines. Salmon. Avoid buying drinks with alcohol in them. Alcohol can affect the way your body gets rid of uric acid. Stay away from: [facility]. Hard liquor. Meal planning  Learn which foods do or don't affect you. If you find a food that causes problems, avoid eating that food. If you have any questions about a food item, talk with your health care  provider. Eat less meat. When you do eat meat, choose meats that have less purine in them. Eat lots of fruits and vegetables. Even though some vegetables have more purine in them, they don't add as much to the amount of uric acid in your blood as other foods. Have at least one serving of dairy a day. General information If you drink alcohol: Limit how much you have to: 0-1 drink a day if you're male. 0-2 drinks a day if you're male. Know how much alcohol is in your drink. In the [place] one drink is one 12 oz bottle of beer (355  mL), one 5 oz glass of wine (148 mL), or one 1 oz glass of hard liquor (44 mL). Drink more fluids as told. Fluids can help remove uric acid from your body. Work with your provider to make a plan to help you lose weight or stay at a healthy weight. Losing weight can help lower how much uric acid is in your blood. What foods are recommended? You can have: Fresh or frozen fruits and vegetables. Whole grains, breads, cereals, and pasta. Rice. Beans, peas, and legumes. Nuts and seeds. Dairy products. Fats and oils. The items listed above may not be all the foods and drinks you can have. Talk with an expert in healthy eating called a dietitian to learn more. What foods are not recommended? Limit your intake of foods and drinks high in purines. These include: Beer and other types of alcohol. Meat-based gravy or sauce. Canned or fresh seafood, such as: Anchovies, sardines, herring, salmon, and tuna. Mussels, scallops, shrimp, and crab. Codfish, trout, and haddock. Bacon, veal, chicken breast with skin, and lamb. Organ meats, such as: Liver or kidney. Tripe. Sweetbreads (thymus gland or pancreas). Wild education officer, environmental. Yeast or yeast extract supplements. Drinks that have been made sweeter with high-fructose corn syrup. These include sodas. Processed foods made with high-fructose corn syrup. The items listed above may not be all the foods and drinks you should limit. Talk with a dietitian to learn more. This information is not intended to replace advice given to you by your health care provider. Make sure you discuss any questions you have with your health care provider. Document Revised: 02/10/2024 Document Reviewed: 02/10/2024 Elsevier Patient Education  [employer].   If you have been instructed to have an in-person evaluation today at a local Urgent Care facility, please use the link below. It will take you to a list of all of our available [facility], including  address, phone number and hours of operation. Please do not delay care.  [facility]  If you or a family member do not have a primary care provider, use the link below to schedule a visit and establish care. When you choose a [facility] primary care physician or advanced practice provider, you gain a long-term partner in health. Find a Primary Care Provider  Learn more about [facility]'s in-office and virtual care options: [facility] - Get Care Now

## 2024-11-04 NOTE — Progress Notes (Signed)
 Virtual Visit Consent   Taylor Herrera, you are scheduled for a virtual visit with a [facility] provider today. Just as with appointments in the office, your consent must be obtained to participate. Your consent will be active for this visit and any virtual visit you may have with one of our providers in the next 365 days. If you have a MyChart account, a copy of this consent can be sent to you electronically.  As this is a virtual visit, video technology does not allow for your provider to perform a traditional examination. This may limit your provider's ability to fully assess your condition. If your provider identifies any concerns that need to be evaluated in person or the need to arrange testing (such as labs, EKG, etc.), we will make arrangements to do so. Although advances in technology are sophisticated, we cannot ensure that it will always work on either your end or our end. If the connection with a video visit is poor, the visit may have to be switched to a telephone visit. With either a video or telephone visit, we are not always able to ensure that we have a secure connection.  By engaging in this virtual visit, you consent to the provision of healthcare and authorize for your insurance to be billed (if applicable) for the services provided during this visit. Depending on your insurance coverage, you may receive a charge related to this service.  I need to obtain your verbal consent now. Are you willing to proceed with your visit today? Taylor Herrera has provided verbal consent on 11/04/2024 for a virtual visit (video or telephone). [name], NP  Date: 11/04/2024 9:29 AM   Virtual Visit via Video Note   I, [name], connected with  [name]  ([id number], 1976-02-06) on 11/04/2024 at  9:30 AM EST by a video-enabled telemedicine application and verified that I am speaking with the correct person using two identifiers.  Location: Patient: Virtual Visit Location  Patient: Home Provider: Virtual Visit Location Provider: Home Office   I discussed the limitations of evaluation and management by telemedicine and the availability of in person appointments. The patient expressed understanding and agreed to proceed.    History of Present Illness: Taylor Herrera is a 48 y.o. who identifies as a male who was assigned male at birth, and is being seen today for gout.  Onset was Friday (3 days ago) after eating a lot of red meat and soda over the last few weeks. Right wrist- swelling and redness, and warmth Associated symptoms are left great toe and foot.  Modifying factors are drinking water, takes uloric daily, and ice packs Denies chest pain, shortness of breath, fevers, chills, changes in ROM of gout sites    Problems:  Patient Active Problem List   Diagnosis Date Noted   CKD stage 4 secondary to hypertension (HCC) 07/03/2024   Gout 08/09/2022   Hyperglycemia 08/09/2022   Vitamin D  deficiency 04/25/2022   Depression 02/04/2022   OSA (obstructive sleep apnea) 08/14/2020   Alcohol use disorder 08/01/2018   Preventative health care 04/23/2015   Migraine 04/23/2015   Essential hypertension 09/16/2014    Allergies: Allergies[1] Medications: Current Medications[2]  Observations/Objective: Patient is well-developed, well-nourished in no acute distress.  Resting comfortably  at home.  Head is normocephalic, atraumatic.  No labored breathing.  Speech is clear and coherent with logical content.  Patient is alert and oriented at baseline.  Left wrist is red and swollen  Assessment and Plan:  1. Chronic gout of multiple sites, unspecified cause (Primary)  - predniSONE  (STERAPRED UNI-PAK 21 TAB) 10 MG (21) TBPK tablet; Take as directed  Dispense: 21 tablet; Refill: 0  -chronic gout issues, switched to uloric in Oct- believes this is diet related- recent red meat intake higher than normal -encouraged to watch diet- info on diet changes in  AVS -taper of pred ordered -reports it works better than burst -follow up with neph if not improving -info on AVS for prevention  Reviewed side effects, risks and benefits of medication.    Patient acknowledged agreement and understanding of the plan.   Past Medical, Surgical, Social History, Allergies, and Medications have been Reviewed.     Follow Up Instructions: I discussed the assessment and treatment plan with the patient. The patient was provided an opportunity to ask questions and all were answered. The patient agreed with the plan and demonstrated an understanding of the instructions.  A copy of instructions were sent to the patient via MyChart unless otherwise noted below.    The patient was advised to call back or seek an in-person evaluation if the symptoms worsen or if the condition fails to improve as anticipated.    [name], NP     [1] No Known Allergies [2]  Current Outpatient Medications:    amLODipine  (NORVASC ) 10 MG tablet, Take 10 mg by mouth daily., Disp: , Rfl:    carvedilol  (COREG ) 25 MG tablet, TAKE 1 TABLET (25 MG TOTAL) BY MOUTH TWICE A DAY WITH MEALS, Disp: 180 tablet, Rfl: 0   Cholecalciferol (HM VITAMIN D3) 100 MCG (4000 UT) CAPS, Take 1 tablet by mouth once daily., Disp: 30 capsule, Rfl:    cloNIDine  (CATAPRES ) 0.1 MG tablet, Take 1 tablet (0.1 mg total) by mouth 2 (two) times daily., Disp: 180 tablet, Rfl: 1   escitalopram  (LEXAPRO ) 20 MG tablet, Take 1 tablet (20 mg total) by mouth daily., Disp: 90 tablet, Rfl: 0   febuxostat (ULORIC) 40 MG tablet, Take 40 mg by mouth daily., Disp: , Rfl:    naltrexone  (DEPADE) 50 MG tablet, 1/2 tab once daily for 3 days, then increase to a full tab once daily on day 4 as tolerated., Disp: 30 tablet, Rfl: 0

## 2024-12-12 ENCOUNTER — Telehealth: Admitting: Family Medicine

## 2024-12-12 ENCOUNTER — Ambulatory Visit
Admission: RE | Admit: 2024-12-12 | Discharge: 2024-12-12 | Disposition: A | Discharge status: Home / Self Care | Attending: Emergency Medicine | Admitting: Emergency Medicine

## 2024-12-12 VITALS — BP 169/128 | HR 82 | Temp 98.4°F | Resp 17

## 2024-12-12 DIAGNOSIS — I10 Essential (primary) hypertension: Secondary | ICD-10-CM

## 2024-12-12 DIAGNOSIS — M109 Gout, unspecified: Secondary | ICD-10-CM

## 2024-12-12 DIAGNOSIS — M1A09X0 Idiopathic chronic gout, multiple sites, without tophus (tophi): Secondary | ICD-10-CM

## 2024-12-12 MED ORDER — PREDNISONE 20 MG PO TABS: 40.0000 mg | ORAL_TABLET | Freq: Every day | ORAL | 0 refills | Status: AC

## 2024-12-12 NOTE — ED Triage Notes (Signed)
 Pt c/o RT wrist pain and swelling since last night. Denies recent injury however has broken that wrist before. Hx of gout. No OTC meds taken. Pt is hypertensive in triage. Has not taken BP meds today yet.

## 2024-12-12 NOTE — ED Provider Notes (Signed)
 " Taylor Herrera    CSN: [id number] Arrival date & time: 12/12/24  1022      History   Chief Complaint Chief Complaint  Patient presents with   Joint Pain    RT wrist    HPI Taylor Herrera is a 49 y.o. male.   Patient presents with concerns for gout flare to his right wrist that began last night.  Patient reports that he began to have pain and swelling last night and he has been wearing a wrist brace to help with this.  Patient denies taking any medication for his symptoms.  Patient reports that he is on febuxostat daily to help with gout flares.  Of note patient does have a history of stage IV CKD and therefore cannot take NSAIDs.  Patient blood pressure is elevated in clinic today.  Patient reports that he did not take his blood pressure medication today.  Patient reports that he has not been out of these medications he does has not taken them today.  The history is provided by the patient and medical records.    Past Medical History:  Diagnosis Date   Essential hypertension 09/16/2014   History of alcohol abuse 08/01/2018   History of kidney stones    HTN (hypertension) 04/23/2015   Hypertension    Migraine    Preventative health Herrera 04/23/2015   Renal disorder    Sepsis without acute organ dysfunction (HCC) 08/04/2022    Patient Active Problem List   Diagnosis Date Noted   CKD stage 4 secondary to hypertension (HCC) 07/03/2024   Gout 08/09/2022   Hyperglycemia 08/09/2022   Vitamin D  deficiency 04/25/2022   Depression 02/04/2022   OSA (obstructive sleep apnea) 08/14/2020   Alcohol use disorder 08/01/2018   Preventative health Herrera 04/23/2015   Migraine 04/23/2015   Essential hypertension 09/16/2014    History reviewed. No pertinent surgical history.     Home Medications    Prior to Admission medications  Medication Sig Start Date End Date Taking? Authorizing Provider  predniSONE  (DELTASONE ) 20 MG tablet Take 2 tablets (40 mg total) by mouth  daily for 7 days. 12/12/24 12/19/24 Yes Ayra Hodgdon A, NP  amLODipine  (NORVASC ) 10 MG tablet Take 10 mg by mouth daily. 08/28/24   [provider]  carvedilol  (COREG ) 25 MG tablet TAKE 1 TABLET (25 MG TOTAL) BY MOUTH TWICE A DAY WITH MEALS 07/02/24   O'Sullivan, Melissa, NP  Cholecalciferol (HM VITAMIN D3) 100 MCG (4000 UT) CAPS Take 1 tablet by mouth once daily. 04/25/22   O'Sullivan, Melissa, NP  cloNIDine  (CATAPRES ) 0.1 MG tablet Take 1 tablet (0.1 mg total) by mouth 2 (two) times daily. 07/02/24   O'Sullivan, Melissa, NP  escitalopram  (LEXAPRO ) 20 MG tablet Take 1 tablet (20 mg total) by mouth daily. 09/27/24   O'Sullivan, Melissa, NP  febuxostat (ULORIC) 40 MG tablet Take 40 mg by mouth daily. 08/31/24   [provider]  naltrexone  (DEPADE) 50 MG tablet 1/2 tab once daily for 3 days, then increase to a full tab once daily on day 4 as tolerated. 09/27/24   Daryl Setter, NP    Family History Family History  Problem Relation Age of Onset   Cancer Mother        multiple myeloma, ovarian   Hypertension Mother    Hypertension Father    Arrhythmia Father    Kidney disease Father        ESRD on HD   Hypertension Sister    Colon cancer  Neg Hx    Colon polyps Neg Hx    Esophageal cancer Neg Hx    Rectal cancer Neg Hx    Stomach cancer Neg Hx     Social History Social History[1]   Allergies   Patient has no known allergies.   Review of Systems Review of Systems  Per HPI  Physical Exam Triage Vital Signs ED Triage Vitals  Encounter Vitals Group     BP 12/12/24 1034 (!) 169/128     Girls Systolic BP Percentile --      Girls Diastolic BP Percentile --      Boys Systolic BP Percentile --      Boys Diastolic BP Percentile --      Pulse Rate 12/12/24 1034 82     Resp 12/12/24 1034 17     Temp 12/12/24 1034 98.4 F (36.9 C)     Temp Source 12/12/24 1034 Oral     SpO2 12/12/24 1034 99 %     Weight --      Height --      Head Circumference --      Peak  Flow --      Pain Score 12/12/24 1035 10     Pain Loc --      Pain Education --      Exclude from Growth Chart --    No data found.  Updated Vital Signs BP (!) 169/128 (BP Location: Right Arm)   Pulse 82   Temp 98.4 F (36.9 C) (Oral)   Resp 17   SpO2 99%   Visual Acuity Right Eye Distance:   Left Eye Distance:   Bilateral Distance:    Right Eye Near:   Left Eye Near:    Bilateral Near:     Physical Exam Vitals and nursing note reviewed.  Constitutional:      General: He is awake. He is not in acute distress.    Appearance: Normal appearance. He is well-developed and well-groomed. He is not ill-appearing.  Musculoskeletal:     Right wrist: Swelling and tenderness present. No deformity or snuff box tenderness. Normal range of motion.     Right hand: Swelling present. No deformity, tenderness or bony tenderness. Normal range of motion. Normal strength. Normal sensation. There is no disruption of two-point discrimination. Normal capillary refill. Normal pulse.     Comments: Generalized swelling and tenderness noted to right wrist. Some mild swelling and the right hand as well.  Skin:    General: Skin is warm and dry.  Neurological:     Mental Status: He is alert.  Psychiatric:        Behavior: Behavior is cooperative.      UC Treatments / Results  Labs (all labs ordered are listed, but only abnormal results are displayed) Labs Reviewed - No data to display  EKG   Radiology No results found.  Procedures Procedures (including critical Herrera time)  Medications Ordered in UC Medications - No data to display  Initial Impression / Assessment and Plan / UC Course  I have reviewed the triage vital signs and the nursing notes.  Pertinent labs & imaging results that were available during my Herrera of the patient were reviewed by me and considered in my medical decision making (see chart for details).     Patient is overall well-appearing.  Vitals are overall  stable.  Blood pressure is elevated at 169/128.  Exam findings consistent with flare of chronic gout.  Prescribed short course of prednisone   to help with this.  Recommended taking Tylenol  as needed for pain.  Discussed RICE therapy.  Discussed importance of taking blood pressure medication daily.  Discussed follow-up and return precautions. Final Clinical Impressions(s) / UC Diagnoses   Final diagnoses:  Acute gout of right wrist, unspecified cause  Elevated blood pressure reading in office with diagnosis of hypertension     Discharge Instructions      Start taking 2 tablets of prednisone  once daily for 7 days to help with gout flare. You can take 500 to 1000 mg of Tylenol  every 6-8 hours as needed for pain. Rest, ice, and elevate as needed pain throughout the day to help with swelling and pain. Be sure to take your blood pressure medication daily as prescribed. Follow-up with your primary Herrera provider for further evaluation and management of chronic and ongoing gout.   ED Prescriptions     Medication Sig Dispense Auth. Provider   predniSONE  (DELTASONE ) 20 MG tablet Take 2 tablets (40 mg total) by mouth daily for 7 days. 14 tablet Johnie Flaming A, NP      PDMP not reviewed this encounter.    [1]  Social History Tobacco Use   Smoking status: Never   Smokeless tobacco: Never  Vaping Use   Vaping status: Never Used  Substance Use Topics   Alcohol use: Not Currently    Comment: none currently   Drug use: No     Johnie Flaming A, NP 12/12/24 1057  "

## 2024-12-12 NOTE — Discharge Instructions (Signed)
 Start taking 2 tablets of prednisone  once daily for 7 days to help with gout flare. You can take 500 to 1000 mg of Tylenol  every 6-8 hours as needed for pain. Rest, ice, and elevate as needed pain throughout the day to help with swelling and pain. Be sure to take your blood pressure medication daily as prescribed. Follow-up with your primary care provider for further evaluation and management of chronic and ongoing gout.

## 2024-12-12 NOTE — Progress Notes (Signed)
   Needs in person for labs and eval and assessment of chronic gout. He has had several rounds of prednisone  in last 6 months.  Patient acknowledged agreement and understanding of the plan.

## 2025-01-03 ENCOUNTER — Encounter: Admitting: Family

## 2025-01-08 ENCOUNTER — Encounter: Admitting: Family
